# Patient Record
Sex: Female | Born: 1945 | Race: White | Hispanic: No | Marital: Married | Smoking: Former smoker
Health system: Southern US, Community
[De-identification: ages and names within clinical notes are randomized; demographics above are authoritative.]

## PROBLEM LIST (undated history)

## (undated) DIAGNOSIS — Z8489 Family history of other specified conditions: Secondary | ICD-10-CM

## (undated) DIAGNOSIS — I2510 Atherosclerotic heart disease of native coronary artery without angina pectoris: Secondary | ICD-10-CM

## (undated) DIAGNOSIS — C801 Malignant (primary) neoplasm, unspecified: Secondary | ICD-10-CM

## (undated) DIAGNOSIS — E039 Hypothyroidism, unspecified: Secondary | ICD-10-CM

## (undated) DIAGNOSIS — E7800 Pure hypercholesterolemia, unspecified: Secondary | ICD-10-CM

## (undated) DIAGNOSIS — E119 Type 2 diabetes mellitus without complications: Secondary | ICD-10-CM

## (undated) DIAGNOSIS — T753XXA Motion sickness, initial encounter: Secondary | ICD-10-CM

## (undated) DIAGNOSIS — G4730 Sleep apnea, unspecified: Secondary | ICD-10-CM

## (undated) HISTORY — PX: CORONARY ANGIOPLASTY: SHX604

## (undated) HISTORY — PX: EYE SURGERY: SHX253

## (undated) HISTORY — PX: APPENDECTOMY: SHX54

## (undated) HISTORY — PX: ABDOMINAL HYSTERECTOMY: SHX81

## (undated) HISTORY — PX: OTHER SURGICAL HISTORY: SHX169

---

## 2004-08-22 ENCOUNTER — Ambulatory Visit: Payer: Self-pay | Admitting: Internal Medicine

## 2005-09-09 ENCOUNTER — Ambulatory Visit: Payer: Self-pay | Admitting: Internal Medicine

## 2006-09-17 ENCOUNTER — Ambulatory Visit: Payer: Self-pay | Admitting: Internal Medicine

## 2007-10-19 ENCOUNTER — Ambulatory Visit: Payer: Self-pay | Admitting: Internal Medicine

## 2008-08-31 ENCOUNTER — Ambulatory Visit: Payer: Self-pay | Admitting: Internal Medicine

## 2008-12-13 ENCOUNTER — Ambulatory Visit: Payer: Self-pay | Admitting: Internal Medicine

## 2010-01-15 ENCOUNTER — Ambulatory Visit: Payer: Self-pay

## 2011-03-26 ENCOUNTER — Ambulatory Visit: Payer: Self-pay

## 2012-08-24 ENCOUNTER — Ambulatory Visit: Payer: Self-pay | Admitting: Obstetrics and Gynecology

## 2013-08-22 ENCOUNTER — Emergency Department: Payer: Self-pay | Admitting: Emergency Medicine

## 2013-08-22 LAB — CBC
HCT: 37.2 % (ref 35.0–47.0)
HGB: 12 g/dL (ref 12.0–16.0)
MCH: 30.3 pg (ref 26.0–34.0)
MCHC: 32.2 g/dL (ref 32.0–36.0)
MCV: 94 fL (ref 80–100)
Platelet: 293 10*3/uL (ref 150–440)
RBC: 3.96 10*6/uL (ref 3.80–5.20)
RDW: 13.9 % (ref 11.5–14.5)
WBC: 6.5 10*3/uL (ref 3.6–11.0)

## 2013-08-22 LAB — BASIC METABOLIC PANEL
Anion Gap: 5 — ABNORMAL LOW (ref 7–16)
BUN: 14 mg/dL (ref 7–18)
CHLORIDE: 105 mmol/L (ref 98–107)
CO2: 30 mmol/L (ref 21–32)
Calcium, Total: 9.5 mg/dL (ref 8.5–10.1)
Creatinine: 0.86 mg/dL (ref 0.60–1.30)
EGFR (Non-African Amer.): >60
Glucose: 144 mg/dL — ABNORMAL HIGH (ref 65–99)
Osmolality: 282 (ref 275–301)
Potassium: 4.4 mmol/L (ref 3.5–5.1)
SODIUM: 140 mmol/L (ref 136–145)

## 2013-08-22 LAB — TROPONIN I: Troponin-I: <0.02 ng/mL

## 2013-08-22 LAB — D-DIMER(ARMC): D-Dimer: 296 ng/ml

## 2013-09-07 ENCOUNTER — Ambulatory Visit: Payer: Self-pay | Admitting: Internal Medicine

## 2013-09-12 DIAGNOSIS — C801 Malignant (primary) neoplasm, unspecified: Secondary | ICD-10-CM

## 2013-09-12 HISTORY — DX: Malignant (primary) neoplasm, unspecified: C80.1

## 2014-01-04 ENCOUNTER — Ambulatory Visit: Payer: Self-pay | Admitting: Surgery

## 2014-08-05 NOTE — Op Note (Signed)
PATIENT NAME:  [name], TADLOCK MR#:  [id number] DATE OF BIRTH:  Mar 07, 1946  DATE OF PROCEDURE:  01/04/2014  PREOPERATIVE DIAGNOSIS: Screening colonoscopy.   POSTOPERATIVE DIAGNOSIS: Screening colonoscopy, single diverticulum of the descending colon.   SURGEON: Loreli Dollar, MD.   PROCEDURE: Colonoscopy.   INDICATIONS: This 69 year old female has never had screening colonoscopy and requested.   DESCRIPTION OF PROCEDURE: The patient was placed on the stretcher in the left lateral decubitus position. She was monitored with pulse oximetry, intermittent blood pressure recordings and cardiac monitor, given oxygen via nasal cannula and monitored by the anesthesia staff and sedated. The Olympus video colonoscope was inserted through the rectum and manipulated through a somewhat tortuous colon around to the cecum, which was identified by the ileocecal valve. Colon preparation was fairly good. Some bilious material was suctioned. The visibility was good. The colonic mucosa was examined as the scope was gradually withdrawn seeing no polyps or tumors. There was a single diverticulum encountered in the descending colon, which appeared to be small in size. The sigmoid colon and rectum appeared normal and the scope was removed.   Digital rectal exam did demonstrate weak sphincter tone and no palpable mass. The patient appeared to be in satisfactory condition and was moved to the recovery room for postoperative care.    ____________________________ J. Rochel Brome, MD jws:at D: 01/04/2014 09:07:13 ET T: 01/04/2014 12:12:24 ET JOB#: [id number]  cc: Loreli Dollar, MD, <Dictator> Loreli Dollar MD ELECTRONICALLY SIGNED 01/04/2014 13:23

## 2014-08-28 ENCOUNTER — Other Ambulatory Visit: Payer: Self-pay | Admitting: Internal Medicine

## 2014-08-28 DIAGNOSIS — Z1231 Encounter for screening mammogram for malignant neoplasm of breast: Secondary | ICD-10-CM

## 2014-09-12 ENCOUNTER — Ambulatory Visit
Admission: RE | Admit: 2014-09-12 | Discharge: 2014-09-12 | Disposition: A | Discharge status: Home / Self Care | Payer: Medicare Other | Source: Ambulatory Visit | Attending: Internal Medicine | Admitting: Internal Medicine

## 2014-09-12 ENCOUNTER — Other Ambulatory Visit: Payer: Self-pay | Admitting: Internal Medicine

## 2014-09-12 DIAGNOSIS — Z1231 Encounter for screening mammogram for malignant neoplasm of breast: Secondary | ICD-10-CM

## 2014-09-12 HISTORY — DX: Malignant (primary) neoplasm, unspecified: C80.1

## 2015-05-28 ENCOUNTER — Encounter: Payer: Medicare Other | Attending: Surgery | Admitting: Surgery

## 2015-05-28 DIAGNOSIS — E039 Hypothyroidism, unspecified: Secondary | ICD-10-CM | POA: Diagnosis not present

## 2015-05-28 DIAGNOSIS — L97222 Non-pressure chronic ulcer of left calf with fat layer exposed: Secondary | ICD-10-CM | POA: Diagnosis not present

## 2015-05-28 DIAGNOSIS — Z87891 Personal history of nicotine dependence: Secondary | ICD-10-CM | POA: Diagnosis not present

## 2015-05-28 DIAGNOSIS — E785 Hyperlipidemia, unspecified: Secondary | ICD-10-CM | POA: Diagnosis not present

## 2015-05-28 DIAGNOSIS — Y839 Surgical procedure, unspecified as the cause of abnormal reaction of the patient, or of later complication, without mention of misadventure at the time of the procedure: Secondary | ICD-10-CM | POA: Diagnosis not present

## 2015-05-28 DIAGNOSIS — E11622 Type 2 diabetes mellitus with other skin ulcer: Secondary | ICD-10-CM | POA: Diagnosis not present

## 2015-05-28 DIAGNOSIS — T8131XA Disruption of external operation (surgical) wound, not elsewhere classified, initial encounter: Secondary | ICD-10-CM | POA: Diagnosis not present

## 2015-05-28 DIAGNOSIS — C4492 Squamous cell carcinoma of skin, unspecified: Secondary | ICD-10-CM | POA: Insufficient documentation

## 2015-05-28 NOTE — Progress Notes (Addendum)
[name], [name] ([id number]) Visit Report for 05/28/2015 Chief Complaint Document Details Patient Name: [name], TACK. Date of Service: 05/28/2015 9:30 AM Medical Record Patient Account Number: [id number] Number: Afful, RN, BSN, Treating RN: 03-13-1946 (70 y.o. [name] Date of Birth/Sex: Female) Other Clinician: Primary Care Physician: Tracie Harrier Treating Ketina Mars Referring Physician: Tracie Harrier Physician/Extender: Suella Grove in Treatment: 0 Information Obtained from: Patient Chief Complaint Patient seen for complaints of Non-Healing Wound. the patient had a left lower extremity skin lesion excised in September 2016. She has had a nonhealing wound since Electronic Signature(s) Signed: 05/28/2015 10:46:51 AM By: Christin Fudge MD, FACS Entered By: Christin Fudge on 05/28/2015 10:46:51 [name] ([id number]) -------------------------------------------------------------------------------- Debridement Details Patient Name: [name] Date of Service: 05/28/2015 9:30 AM Medical Record Patient Account Number: [id number] Number: Afful, RN, BSN, Treating RN: [birth year] (70 y.o. [name] Date of Birth/Sex: Female) Other Clinician: Primary Care Physician: Tracie Harrier Treating Alistar Mcenery Referring Physician: Tracie Harrier Physician/Extender: Suella Grove in Treatment: 0 Debridement Performed for Wound #1 Left,Lateral Lower Leg Assessment: Performed By: Physician Christin Fudge, MD Debridement: Debridement Pre-procedure Yes Verification/Time Out Taken: Start Time: 10:40 Pain Control: Lidocaine 4% Topical Solution Level: Skin/Subcutaneous Tissue Total Area Debrided (L x 2 (cm) x 2.5 (cm) = 5 (cm) W): Tissue and other Viable, Non-Viable, Fibrin/Slough, Skin, Subcutaneous material debrided: Instrument: Other : gauze and saline Bleeding: Minimum End Time: 10:41 Procedural Pain: 1 Post Procedural Pain: 1 Response to Treatment:  Procedure was tolerated well Post Debridement Measurements of Total Wound Length: (cm) 2 Width: (cm) 2.5 Depth: (cm) 0.1 Volume: (cm) 0.393 Post Procedure Diagnosis Same as Pre-procedure Electronic Signature(s) Signed: 05/28/2015 10:46:23 AM By: Christin Fudge MD, FACS Signed: 05/29/2015 4:45:24 PM By: Regan Lemming BSN, RN Entered By: Christin Fudge on 05/28/2015 10:46:23 [name] ([id number]) -------------------------------------------------------------------------------- HPI Details Patient Name: [name] Date of Service: 05/28/2015 9:30 AM Medical Record Patient Account Number: [id number] Number: Afful, RN, BSN, Treating RN: [birth year] (70 y.o. [name] Date of Birth/Sex: Female) Other Clinician: Primary Care Physician: Tracie Harrier Treating Chloe Baig Referring Physician: Tracie Harrier Physician/Extender: Weeks in Treatment: 0 History of Present Illness Location: left lower extremity laterally Quality: Patient reports experiencing a dull pain to affected area(s). Severity: Patient states wound (s) are getting better. Duration: Patient has had the wound for > 3 months prior to seeking treatment at the wound center Timing: Pain in wound is Intermittent (comes and goes Context: The wound occurred when the patient had a squamous cell carcinoma excised and the sutures were removed and the wound opened out Modifying Factors: Other treatment(s) tried include:antibiotics for 2 separate courses Associated Signs and Symptoms: Patient reports having difficulty standing for long periods. HPI Description: 70 year old diabetic whose last hemoglobin A1c was 7.9 in August 2016, has had a skin cancer removed from her left lower extremity in September 2006. The wound got infected was given Keflex and had it for about a month. She continues to have an open ulcerated area on this leg and most recently was seen by her PCP and given doxycycline for 2 weeks. She  is a ex-smoker who gave up smoking in 2000. Past medical history significant for anemia, hyperlipidemia, hypothyroidism and as noted diabetes mellitus. She is also status post hysterectomy, appendectomy and carpal tunnel release. the patient has been using some abrasive methods of cleaning the wound with hydrogen peroxide and has been leaving the wound open as per instructions of the dermatologist. She may also have had a skin  substitute applied sometime in the past Electronic Signature(s) Signed: 05/28/2015 10:48:09 AM By: Christin Fudge MD, FACS Previous Signature: 05/28/2015 9:51:02 AM Version By: Christin Fudge MD, FACS Entered By: Christin Fudge on 05/28/2015 10:48:09 [name] ([id number]) -------------------------------------------------------------------------------- Physical Exam Details Patient Name: [name] Date of Service: 05/28/2015 9:30 AM Medical Record Patient Account Number: [id number] Number: Afful, RN, BSN, Treating RN: 12-14-1945 (70 y.o. [name] Date of Birth/Sex: Female) Other Clinician: Primary Care Physician: Tracie Harrier Treating Jazmon Kos Referring Physician: Tracie Harrier Physician/Extender: Weeks in Treatment: 0 Constitutional . Pulse regular. Respirations normal and unlabored. Afebrile. . Eyes Nonicteric. Reactive to light. Ears, Nose, Mouth, and Throat Lips, teeth, and gums WNL.Marland Kitchen Moist mucosa without lesions. Neck supple and nontender. No palpable supraclavicular or cervical adenopathy. Normal sized without goiter. Respiratory WNL. No retractions.. Cardiovascular Pedal Pulses WNL. ABI on the left and right lower extremity 0.99. No clubbing, cyanosis or edema. Gastrointestinal (GI) Abdomen without masses or tenderness.. No liver or spleen enlargement or tenderness.. Lymphatic No adneopathy. No adenopathy. No adenopathy. Musculoskeletal Adexa without tenderness or enlargement.. Digits and nails w/o clubbing,  cyanosis, infection, petechiae, ischemia, or inflammatory conditions.. Integumentary (Hair, Skin) No suspicious lesions. No crepitus or fluctuance. No peri-wound warmth or erythema. No masses.Marland Kitchen Psychiatric Judgement and insight Intact.. No evidence of depression, anxiety, or agitation.. Notes she has a circular wound on the left lower extremity lateral compartment in the mid lower leg. There is significant amount of debris over the wound and the edges are clean. This does not look like a recurrence of carcinoma. Sharp debridement was done with moist saline gauze and all the skin and surrounding debris were removed. Bleeding was controlled with pressure Electronic Signature(s) Signed: 05/28/2015 10:49:20 AM By: Christin Fudge MD, FACS Entered By: Christin Fudge on 05/28/2015 10:49:18 [name], [name] ([name] ([id number]) -------------------------------------------------------------------------------- Physician Orders Details Patient Name: [name] Date of Service: 05/28/2015 9:30 AM Medical Record Number: [id number] Patient Account Number: [id number] Date of Birth/Sex: 01/27/1946 (69 y.o. Female) Treating RN: Macarthur Critchley Primary Care Physician: Tracie Harrier Other Clinician: Referring Physician: Tracie Harrier Treating Physician/Extender: Frann Rider in Treatment: 0 Verbal / Phone Orders: Yes Clinician: Macarthur Critchley Read Back and Verified: Yes Diagnosis Coding Wound Cleansing Wound #1 Left,Lateral Lower Leg o Cleanse wound with mild soap and water o May Shower, gently pat wound dry prior to applying new dressing. Skin Barriers/Peri-Wound Care Wound #1 Left,Lateral Lower Leg o Skin Prep Primary Wound Dressing Wound #1 Left,Lateral Lower Leg o Prisma Ag Secondary Dressing Wound #1 Left,Lateral Lower Leg o Boardered Foam Dressing Dressing Change Frequency Wound #1 Left,Lateral Lower Leg o Change dressing every  other day. Follow-up Appointments Wound #1 Left,Lateral Lower Leg o Return Appointment in 1 week. Edema Control Wound #1 Left,Lateral Lower Leg o Other: - elevate leg as much as possible Notes obtain notes from [facility] [place] keep blood sugars under control Electronic Signature(s) ELSE, MCGAUGHEY ([id number]) Signed: 05/28/2015 4:26:55 PM By: Christin Fudge MD, FACS Signed: 05/28/2015 4:28:54 PM By: Rebecca Eaton RN, Sendra Entered By: Rebecca Eaton RN, Sendra on 05/28/2015 10:43:07 [name] ([id number]) -------------------------------------------------------------------------------- Problem List Details Patient Name: [name]. Date of Service: 05/28/2015 9:30 AM Medical Record Patient Account Number: [id number] Number: Afful, RN, BSN, Treating RN: 1945/07/08 (70 y.o. 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 [name] Date of Birth/Sex: Female) Other Clinician: Primary Care Physician: Tracie Harrier Treating Muzamil Harker Referring Physician: Tracie Harrier Physician/Extender: Suella Grove in Treatment: 0 Subjective Chief Complaint Information obtained from Patient Patient seen for complaints of Non-Healing Wound. the patient had a left lower extremity skin lesion excised in September 2016. She has had a nonhealing wound since History of Present Illness (HPI) The following HPI elements were documented for the patient's wound: Location: left lower extremity laterally Quality: Patient reports experiencing a dull pain to affected area(s). Severity: Patient states wound (s) are getting better. Duration: Patient has had the wound for > 3 months prior to seeking treatment at the wound center Timing: Pain in wound is Intermittent (comes and goes Context: The wound occurred when the patient had a squamous cell carcinoma excised and the sutures were removed and the wound opened out Modifying Factors: Other treatment(s) tried include:antibiotics for 2 separate courses Associated Signs and Symptoms: Patient reports having difficulty standing for long periods. 70 year old diabetic whose last hemoglobin A1c was 7.9 in August 2016, has had a skin cancer removed from her left lower extremity in September 2006. The wound got infected was given Keflex and had it for about a month. She continues to have an open ulcerated area on this leg and most recently was seen by her PCP and given doxycycline for 2 weeks. She is a ex-smoker who gave up smoking in 2000. Past medical history significant for anemia, hyperlipidemia, hypothyroidism and as noted diabetes mellitus. She is also status post hysterectomy, appendectomy and carpal tunnel release. the patient has been using some abrasive methods of cleaning the wound with hydrogen peroxide and has been leaving the wound open as  per instructions of the dermatologist. She may also have had a skin substitute applied sometime in the past Wound History Patient presents with 1 open wound that has been present for approximately 6 months. Patient has been treating wound in the following manner: skintec and dressing. Laboratory tests have been performed in the last month. Patient reportedly has not tested positive for an antibiotic resistant organism. Patient reportedly has not tested positive for osteomyelitis. Patient reportedly has not had testing performed to evaluate circulation in the legs. Patient experiences the following problems associated with their wounds: infection. [name], [name] ([id number]) Patient History Information obtained from Patient. Allergies Darvocet-N (Reaction: nausea), Percocet (Reaction: nausea), tramadol (Reaction: nausea) Family History Cancer - Mother, Diabetes - Mother, Heart Disease - Mother, No family history of Hypertension, Kidney Disease, Lung Disease, Seizures, Stroke, Thyroid Problems, Tuberculosis. Social History Former smoker - 17 years quit, Marital Status - Married, Alcohol Use - Never, Drug Use - No History, Caffeine Use - Daily. Medical History Eyes Patient has history of Cataracts - beginning stage Ear/Nose/Mouth/Throat Denies history of Chronic sinus problems/congestion, Middle ear problems Hematologic/Lymphatic Denies history of Anemia, Hemophilia, Human Immunodeficiency Virus,  Lymphedema, Sickle Cell Disease Respiratory Denies history of Aspiration, Asthma, Chronic Obstructive Pulmonary Disease (COPD), Pneumothorax, Sleep Apnea, Tuberculosis Cardiovascular Denies history of Angina, Arrhythmia, Congestive Heart Failure, Coronary Artery Disease, Deep Vein Thrombosis, Hypertension, Hypotension, Myocardial Infarction, Peripheral Arterial Disease, Peripheral Venous Disease, Phlebitis, Vasculitis Gastrointestinal Denies history of Cirrhosis , Colitis, Crohn s,  Hepatitis A, Hepatitis B, Hepatitis C Endocrine Patient has history of Type II Diabetes Genitourinary Denies history of End Stage Renal Disease Immunological Denies history of Lupus Erythematosus, Raynaud s, Scleroderma Integumentary (Skin) Denies history of History of Burn, History of pressure wounds Musculoskeletal Denies history of Gout, Rheumatoid Arthritis, Osteoarthritis, Osteomyelitis Neurologic Denies history of Dementia, Neuropathy, Quadriplegia, Paraplegia, Seizure Disorder Oncologic Denies history of Received Chemotherapy, Received Radiation Psychiatric Denies history of Anorexia/bulimia, Confinement Anxiety Patient is treated with Oral Agents. Blood sugar is tested. Blood sugar results noted at the following times: Breakfast - 163. [name], [name] ([id number]) Hospitalization/Surgery History - hysterectomy. - appendectomy. - endoscopic carpal tunnel release. Medical And Surgical History Notes Constitutional Symptoms (General Health) cholestrol, hypothyroidism, hyperlipidemia Review of Systems (ROS) Constitutional Symptoms (General Health) The patient has no complaints or symptoms. Eyes The patient has no complaints or symptoms. Ear/Nose/Mouth/Throat The patient has no complaints or symptoms. Hematologic/Lymphatic The patient has no complaints or symptoms. Respiratory The patient has no complaints or symptoms. Cardiovascular The patient has no complaints or symptoms. Gastrointestinal The patient has no complaints or symptoms. Endocrine The patient has no complaints or symptoms. Genitourinary The patient has no complaints or symptoms. Immunological The patient has no complaints or symptoms. Integumentary (Skin) Complains or has symptoms of Wounds - 1 left leg. Musculoskeletal The patient has no complaints or symptoms. Neurologic The patient has no complaints or symptoms. Oncologic Hx skin Cancer Psychiatric Complains or has symptoms of  Claustrophobia. Medications Bactrim DS 800 mg-160 mg tablet oral 1 1 tablet oral Tylenol PM Extra Strength 25 mg-500 mg tablet oral 2 2 tablet oral aspirin 81 mg tablet,delayed release oral 1 1 tablet,delayed release (DR/EC) oral Tylenol Extra Strength 500 mg tablet oral tablet oral glipizide 5 mg tablet oral 1 1 tablet oral lovastatin 20 mg tablet oral 1 1 tablet oral Ocuvite 150 mg-30 unit-5 mg-150 mg capsule oral capsule oral [name], TATRO. ([id number]) alendronate 70 mg tablet oral 1 1 tablet oral Calcium 600 600 mg (1,500 mg) tablet oral 1 1 tablet oral for daily in the morning Estroven 155 mg capsule oral 1 1 capsule oral Hair,Skin,Nails with Biotin 7.5 mg-7.5 unit-1,250 mcg chewable tablet oral 2 2 tablet,chewable oral Geritol Complete 16 mg iron-0.38 mg tablet oral 1 1 tablet oral Cinnamon 500 mg capsule oral 2 2 capsule oral iron 325 mg (65 mg iron) tablet oral 1 1 tablet oral Fish Oil 1,000 mg (120 mg-180 mg) capsule oral 1 1 capsule oral for daily in the morning magnesium 250 mg tablet oral 1 1 tablet oral Centrum 3,500 unit-18 mg-0.4 mg chewable tablet oral tablet,chewable oral doxycycline hyclate 100 mg tablet oral 1 1 tablet oral levothyroxine 125 mcg tablet oral 1 1 tablet oral levothyroxine 25 mcg tablet oral 1 1 tablet oral Vitamin B-12 1,000 mcg tablet oral 1 1 tablet oral vitamin E 600 unit capsule oral 1 1 capsule oral Objective Constitutional Pulse regular. Respirations normal and unlabored. Afebrile. Vitals Time Taken: 10:10 AM, Height: 63 in, Source: Stated, Weight: 155 lbs, Source: Stated, BMI: 27.5, Temperature: 97.8 F, Pulse: 76 bpm, Blood Pressure: 152/67 mmHg. Eyes Nonicteric. Reactive to light. Ears, Nose, Mouth, and Throat Lips,  teeth, and gums WNL.Marland Kitchen Moist mucosa without lesions. Neck supple and nontender. No palpable supraclavicular or cervical adenopathy. Normal sized without goiter. Respiratory WNL. No retractions.. Cardiovascular Pedal  Pulses WNL. ABI on the left and right lower extremity 0.99. No clubbing, cyanosis or edema. Gastrointestinal (GI) Abdomen without masses or tenderness.. No liver or spleen enlargement or tenderness.. Lymphatic No adneopathy. No adenopathy. No adenopathy. [name], [name] ([id number]) Musculoskeletal Adexa without tenderness or enlargement.. Digits and nails w/o clubbing, cyanosis, infection, petechiae, ischemia, or inflammatory conditions.Marland Kitchen Psychiatric Judgement and insight Intact.. No evidence of depression, anxiety, or agitation.. General Notes: she has a circular wound on the left lower extremity lateral compartment in the mid lower leg. There is significant amount of debris over the wound and the edges are clean. This does not look like a recurrence of carcinoma. Sharp debridement was done with moist saline gauze and all the skin and surrounding debris were removed. Bleeding was controlled with pressure Integumentary (Hair, Skin) No suspicious lesions. No crepitus or fluctuance. No peri-wound warmth or erythema. No masses.. Wound #1 status is Open. Original cause of wound was Other Lesion. The wound is located on the Left,Lateral Lower Leg. The wound measures 2cm length x 2.5cm width x 0.1cm depth; 3.927cm^2 area and 0.393cm^3 volume. The wound is limited to skin breakdown. There is no tunneling or undermining noted. There is a large amount of serous drainage noted. The wound margin is flat and intact. There is small (1-33%) red granulation within the wound bed. There is a medium (34-66%) amount of necrotic tissue within the wound bed including Adherent Slough. The periwound skin appearance exhibited: Dry/Scaly, Erythema. The periwound skin appearance did not exhibit: Callus, Crepitus, Excoriation, Fluctuance, Friable, Induration, Localized Edema, Rash, Scarring, Maceration, Moist, Atrophie Blanche, Cyanosis, Ecchymosis, Hemosiderin Staining, Mottled, Pallor, Rubor. The surrounding  wound skin color is noted with erythema which is circumferential. Periwound temperature was noted as No Abnormality. The periwound has tenderness on palpation. Assessment Active Problems ICD-10 E11.622 - Type 2 diabetes mellitus with other skin ulcer T81.31XA - Disruption of external operation (surgical) wound, not elsewhere classified, initial encounter L97.222 - Non-pressure chronic ulcer of left calf with fat layer exposed C44.729 - Squamous cell carcinoma of skin of left lower limb, including hip This 70 year old diabetic has had a nonhealing wound to the left lower extremity from a previous skin cancer removal. Prostate to stop all previous instructions and have recommended: [name], [name]. ([id number]) 1. Dressing on every other day with Prisma AG and a bordered foam dressing 2. She is allowed to wash with soap and water and do the dressing sooner she is out of the shower. 3. good control of her diabetes mellitus 4. Elevation of the limb as often as possible 5. He is back in the wound center on weekly intervals Procedures Wound #1 Wound #1 is an Atypical located on the Left,Lateral Lower Leg . There was a Skin/Subcutaneous Tissue Debridement [id number]) debridement with total area of 5 sq cm performed by Christin Fudge, MD. with the following instrument(s): gauze and saline to remove Viable and Non-Viable tissue/material including Fibrin/Slough, Skin, and Subcutaneous after achieving pain control using Lidocaine 4% Topical Solution. A time out was conducted prior to the start of the procedure. A Minimum amount of bleeding was controlled with N/A. The procedure was tolerated well with a pain level of 1 throughout and a pain level of 1 following the procedure. Post Debridement Measurements: 2cm length x 2.5cm width x 0.1cm depth; 0.393cm^3 volume. Post procedure  Diagnosis Wound #1: Same as Pre-Procedure Plan Wound Cleansing: Wound #1 Left,Lateral Lower Leg: Cleanse wound with  mild soap and water May Shower, gently pat wound dry prior to applying new dressing. Skin Barriers/Peri-Wound Care: Wound #1 Left,Lateral Lower Leg: Skin Prep Primary Wound Dressing: Wound #1 Left,Lateral Lower Leg: Prisma Ag Secondary Dressing: Wound #1 Left,Lateral Lower Leg: Boardered Foam Dressing Dressing Change Frequency: Wound #1 Left,Lateral Lower Leg: Change dressing every other day. Follow-up Appointments: Wound #1 Left,Lateral Lower Leg: Return Appointment in 1 week. Edema Control: Wound #1 Left,Lateral Lower Leg: Other: - elevate leg as much as possible General Notes: obtain notes from [facility] [place] keep blood sugars under [name], [name] ([id number]) control This 70 year old diabetic has had a nonhealing wound to the left lower extremity from a previous skin cancer removal. Prostate to stop all previous instructions and have recommended: 1. Dressing on every other day with Prisma AG and a bordered foam dressing 2. She is allowed to wash with soap and water and do the dressing sooner she is out of the shower. 3. good control of her diabetes mellitus 4. Elevation of the limb as often as possible 5. He is back in the wound center on weekly intervals Electronic Signature(s) Signed: 05/30/2015 5:07:20 PM By: Christin Fudge MD, FACS Previous Signature: 05/28/2015 4:29:41 PM Version By: Christin Fudge MD, FACS Previous Signature: 05/28/2015 10:50:45 AM Version By: Christin Fudge MD, FACS Entered By: Christin Fudge on 05/30/2015 17:07:20 [name] ([id number]) -------------------------------------------------------------------------------- ROS/PFSH Details Patient Name: [name] Date of Service: 05/28/2015 9:30 AM Medical Record Number: [id number] Patient Account Number: [id number] Date of Birth/Sex: 11/04/1945 (69 y.o. Female) Treating RN: Macarthur Critchley Primary Care Physician: Tracie Harrier Other Clinician: Referring  Physician: Tracie Harrier Treating Physician/Extender: Frann Rider in Treatment: 0 Information Obtained From Patient Wound History Do you currently have one or more open woundso Yes How many open wounds do you currently haveo 1 Approximately how long have you had your woundso 6 months How have you been treating your wound(s) until nowo skintec and dressing Has your wound(s) ever healed and then re-openedo No Have you had any lab work done in the past montho Yes Who ordered the lab work doneo PCP Have you tested positive for an antibiotic resistant organism (MRSA, VRE)o No Have you tested positive for osteomyelitis (bone infection)o No Have you had any tests for circulation on your legso No Have you had other problems associated with your woundso Infection Integumentary (Skin) Complaints and Symptoms: Positive for: Wounds - 1 left leg Medical History: Negative for: History of Burn; History of pressure wounds Psychiatric Complaints and Symptoms: Positive for: Claustrophobia Medical History: Negative for: Anorexia/bulimia; Confinement Anxiety Constitutional Symptoms (General Health) Complaints and Symptoms: No Complaints or Symptoms Medical History: Past Medical History Notes: cholestrol, hypothyroidism, hyperlipidemia Eyes Schloss, DELANE WALPOLE ([id number]) Complaints and Symptoms: No Complaints or Symptoms Medical History: Positive for: Cataracts - beginning stage Ear/Nose/Mouth/Throat Complaints and Symptoms: No Complaints or Symptoms Medical History: Negative for: Chronic sinus problems/congestion; Middle ear problems Hematologic/Lymphatic Complaints and Symptoms: No Complaints or Symptoms Medical History: Negative for: Anemia; Hemophilia; Human Immunodeficiency Virus; Lymphedema; Sickle Cell Disease Respiratory Complaints and Symptoms: No Complaints or Symptoms Medical History: Negative for: Aspiration; Asthma; Chronic Obstructive Pulmonary Disease  (COPD); Pneumothorax; Sleep Apnea; Tuberculosis Cardiovascular Complaints and Symptoms: No Complaints or Symptoms Medical History: Negative for: Angina; Arrhythmia; Congestive Heart Failure; Coronary Artery Disease; Deep Vein Thrombosis; Hypertension; Hypotension; Myocardial Infarction; Peripheral Arterial Disease; Peripheral Venous Disease; Phlebitis;  Vasculitis Gastrointestinal Complaints and Symptoms: No Complaints or Symptoms Medical History: Negative for: Cirrhosis ; Colitis; Crohnos; Hepatitis A; Hepatitis B; Hepatitis C Endocrine [name], [name] ([id number]) Complaints and Symptoms: No Complaints or Symptoms Medical History: Positive for: Type II Diabetes Time with diabetes: 6-8 years Treated with: Oral agents Blood sugar tested every day: Yes Tested : daily Blood sugar testing results: Breakfast: 163 Genitourinary Complaints and Symptoms: No Complaints or Symptoms Medical History: Negative for: End Stage Renal Disease Immunological Complaints and Symptoms: No Complaints or Symptoms Medical History: Negative for: Lupus Erythematosus; Raynaudos; Scleroderma Musculoskeletal Complaints and Symptoms: No Complaints or Symptoms Medical History: Negative for: Gout; Rheumatoid Arthritis; Osteoarthritis; Osteomyelitis Neurologic Complaints and Symptoms: No Complaints or Symptoms Medical History: Negative for: Dementia; Neuropathy; Quadriplegia; Paraplegia; Seizure Disorder Oncologic Complaints and Symptoms: Review of System Notes: Hx skin Cancer Medical HistoryCAROLLYN, [name] ([id number]) Negative for: Received Chemotherapy; Received Radiation HBO Extended History Items Eyes: Cataracts Immunizations Immunization Notes: up to date, flu, shingles, pneumonia, tetnus Hospitalization / Surgery History Name of Hospital Purpose of Hospitalization/Surgery Date hysterectomy appendectomy endoscopic carpal tunnel release Family and Social History Cancer: Yes  - Mother; Diabetes: Yes - Mother; Heart Disease: Yes - Mother; Hypertension: No; Kidney Disease: No; Lung Disease: No; Seizures: No; Stroke: No; Thyroid Problems: No; Tuberculosis: No; Former smoker - 17 years quit; Marital Status - Married; Alcohol Use: Never; Drug Use: No History; Caffeine Use: Daily; Financial Concerns: No; Food, Clothing or Shelter Needs: No; Support System Lacking: No; Transportation Concerns: No Physician Affirmation I have reviewed and agree with the above information. Electronic Signature(s) Signed: 05/28/2015 4:26:55 PM By: Christin Fudge MD, FACS Signed: 05/28/2015 4:28:54 PM By: Ardean Larsen Previous Signature: 05/28/2015 10:13:28 AM Version By: Christin Fudge MD, FACS Entered By: Rebecca Eaton RN, Sendra on 05/28/2015 10:31:44 [name] ([id number]) -------------------------------------------------------------------------------- SuperBill Details Patient Name: [name] Date of Service: 05/28/2015 Medical Record Patient Account Number: [id number] Number: Afful, RN, BSN, Treating RN: [birth year] (70 y.o. 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, including hip Facility Procedures CPT4: Description Modifier Quantity Code [id number] - WOUND CARE VISIT-LEV 3 EST PT 1 CPT4: [id number] - DEB SUBQ TISSUE 20 SQ CM/< 1 ICD-10 Description Diagnosis E11.622 Type 2 diabetes mellitus with other skin ulcer T81.31XA Disruption of external operation  (surgical) wound, not elsewhere classified, initial encounter L97.222  Non-pressure chronic ulcer of left calf with fat layer exposed Physician Procedures CPT4: Description Modifier Quantity Code [id number] - WC PHYS LEVEL 4 - NEW PT 25 1 ICD-10 Description Diagnosis E11.622 Type 2 diabetes mellitus with other skin ulcer T81.31XA Disruption of external operation (surgical) wound, not elsewhere  classified, initial encounter 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

## 2015-05-29 NOTE — Progress Notes (Addendum)
[name], SOCKS ([id number]) Visit Report for 05/28/2015 Allergy List Details Patient Name: [name], [name]. Date of Service: 05/28/2015 9:30 AM Medical Record Number: [id number] Patient Account Number: [id number] Date of Birth/Sex: 04/17/1945 (70 y.o. Female) Treating RN: Macarthur Critchley Primary Care Physician: Tracie Harrier Other Clinician: Referring Physician: Tracie Harrier Treating Physician/Extender: Frann Rider in Treatment: 0 Allergies Active Allergies Darvocet-N Reaction: nausea Percocet Reaction: nausea tramadol Reaction: nausea Allergy Notes Electronic Signature(s) Signed: 05/28/2015 4:28:54 PM By: Rebecca Eaton RN, Sendra Entered By: Rebecca Eaton RN, Sendra on 05/28/2015 10:29:51 [name] ([id number]) -------------------------------------------------------------------------------- Arrival Information Details Patient Name: [name] Date of Service: 05/28/2015 9:30 AM Medical Record Number: [id number] Patient Account Number: [id number] Date of Birth/Sex: [birth year] (70 y.o. Female) Treating RN: Macarthur Critchley Primary Care Physician: Tracie Harrier Other Clinician: Referring Physician: Tracie Harrier Treating Physician/Extender: Frann Rider in Treatment: 0 Visit Information Patient Arrived: Ambulatory Arrival Time: 09:46 Accompanied By: husband Transfer Assistance: None Patient Identification Verified: Yes Secondary Verification Process Yes Completed: Patient Has Alerts: Yes Patient Alerts: Patient on Blood Thinner 05/28/15 ABI .99 Right 05/28/15 ABI .99 Left Electronic Signature(s) Signed: 05/28/2015 4:28:54 PM By: Rebecca Eaton RN, Sendra Entered By: Rebecca Eaton RN, Sendra on 05/28/2015 10:21:07 [name] ([id number]) -------------------------------------------------------------------------------- Clinic Level of Care Assessment Details Patient Name: [name] Date of Service: 05/28/2015 9:30  AM Medical Record Number: [id number] Patient Account Number: [id number] Date of Birth/Sex: 12-Nov-1945 (70 y.o. Female) Treating RN: Macarthur Critchley Primary Care Physician: Tracie Harrier Other Clinician: Referring Physician: Tracie Harrier Treating Physician/Extender: Frann Rider in Treatment: 0 Clinic Level of Care Assessment Items TOOL 1 Quantity Score X - Use when EandM and Procedure is performed on INITIAL visit 1 0 ASSESSMENTS - Nursing Assessment / Reassessment X - General Physical Exam (combine w/ comprehensive assessment (listed just 1 20 below) when performed on new pt. evals) X - Comprehensive Assessment (HX, ROS, Risk Assessments, Wounds Hx, etc.) 1 25 ASSESSMENTS - Wound and Skin Assessment / Reassessment []  - Dermatologic / Skin Assessment (not related to wound area) 0 ASSESSMENTS - Ostomy and/or Continence Assessment and Care []  - Incontinence Assessment and Management 0 []  - Ostomy Care Assessment and Management (repouching, etc.) 0 PROCESS - Coordination of Care X - Simple Patient / Family Education for ongoing care 1 15 []  - Complex (extensive) Patient / Family Education for ongoing care 0 X - Staff obtains Programmer, systems, Records, Test Results / Process Orders 1 10 X - Staff telephones HHA, Nursing Homes / Clarify orders / etc 1 10 []  - Routine Transfer to another Facility (non-emergent condition) 0 []  - Routine Hospital Admission (non-emergent condition) 0 []  - New Admissions / Biomedical engineer / Ordering NPWT, Apligraf, etc. 0 []  - Emergency Hospital Admission (emergent condition) 0 PROCESS - Special Needs []  - Pediatric / Minor Patient Management 0 []  - Isolation Patient Management 0 KESSLER, AGATE ([id number]) []  - Hearing / Language / Visual special needs 0 []  - Assessment of Community assistance (transportation, D/C planning, etc.) 0 []  - Additional assistance / Altered mentation 0 []  - Support Surface(s) Assessment (bed, cushion, seat,  etc.) 0 INTERVENTIONS - Miscellaneous []  - External ear exam 0 []  - Patient Transfer (multiple staff / Civil Service fast streamer / Similar devices) 0 []  - Simple Staple / Suture removal (25 or less) 0 []  - Complex Staple / Suture removal (26 or more) 0 []  - Hypo/Hyperglycemic Management (do not check if billed separately) 0 X - Ankle / Brachial Index (ABI) - do not check  if billed separately 1 15 Has the patient been seen at the hospital within the last three years: Yes Total Score: 95 Level Of Care: New/Established - Level 3 Electronic Signature(s) Signed: 05/28/2015 4:28:54 PM By: Rebecca Eaton RN, Sendra Entered By: Rebecca Eaton RN, Sendra on 05/28/2015 13:32:29 [name] ([id number]) -------------------------------------------------------------------------------- Encounter Discharge Information Details Patient Name: [name] Date of Service: 05/28/2015 9:30 AM Medical Record Number: [id number] Patient Account Number: [id number] Date of Birth/Sex: Aug 19, 1945 (70 y.o. Female) Treating RN: Afful, RN, BSN, [name] Primary Care Physician: Tracie Harrier Other Clinician: Referring Physician: Tracie Harrier Treating Physician/Extender: Frann Rider in Treatment: 0 Encounter Discharge Information Items Discharge Pain Level: 0 Discharge Condition: Stable Ambulatory Status: Ambulatory Discharge Destination: Home Transportation: Private Auto Accompanied By: sposue Schedule Follow-up Appointment: Yes Medication Reconciliation completed and provided to Patient/Care No Linley Moxley: Provided on Clinical Summary of Care: 05/28/2015 Form Type Recipient Paper Patient ER Electronic Signature(s) Signed: 05/28/2015 4:28:54 PM By: Rebecca Eaton RN, Sendra Previous Signature: 05/28/2015 10:49:51 AM Version By: Ruthine Dose Entered By: Rebecca Eaton RN, Sendra on 05/28/2015 10:50:14 [name] ([id number]) -------------------------------------------------------------------------------- Lower  Extremity Assessment Details Patient Name: [name] Date of Service: 05/28/2015 9:30 AM Medical Record Number: [id number] Patient Account Number: [id number] Date of Birth/Sex: 05-23-1945 (70 y.o. Female) Treating RN: Macarthur Critchley Primary Care Physician: Tracie Harrier Other Clinician: Referring Physician: Tracie Harrier Treating Physician/Extender: Frann Rider in Treatment: 0 Edema Assessment Assessed: [Left: No] [Right: No] Edema: [Left: No] [Right: No] Calf Left: Right: Point of Measurement: 28 cm From Medial Instep 34 cm 33.5 cm Ankle Left: Right: Point of Measurement: 10 cm From Medial Instep 21 cm 21 cm Vascular Assessment Pulses: Posterior Tibial Palpable: [Left:Yes] Dorsalis Pedis Palpable: [Left:Yes] Extremity colors, hair growth, and conditions: Extremity Color: [Left:Normal] Hair Growth on Extremity: [Left:No] Temperature of Extremity: [Left:Warm] Capillary Refill: [Left:< 3 seconds] Dependent Rubor: [Left:No] Blanched when Elevated: [Left:No] Lipodermatosclerosis: [Left:No] Blood Pressure: Brachial: [Left:152] [Right:150] Dorsalis Pedis: 150 [Left:Dorsalis Pedis: 150] Ankle: Posterior Tibial: [Left:Posterior Tibial: 0.99] [Right:0.99] Toe Nail Assessment Left: Right: Thick: No Discolored: No Deformed: No Harlin, Erin Hearing ([id number]) Improper Length and Hygiene: No Electronic Signature(s) Signed: 05/28/2015 4:28:54 PM By: Rebecca Eaton RN, Sendra Entered By: Rebecca Eaton RN, Sendra on 05/28/2015 10:20:11 [name] ([id number]) -------------------------------------------------------------------------------- Multi Wound Chart Details Patient Name: [name] Date of Service: 05/28/2015 9:30 AM Medical Record Number: [id number] Patient Account Number: [id number] Date of Birth/Sex: April 11, 1946 (70 y.o. Female) Treating RN: Macarthur Critchley Primary Care Physician: Tracie Harrier Other Clinician: Referring Physician:  Tracie Harrier Treating Physician/Extender: Frann Rider in Treatment: 0 Vital Signs Height(in): 63 Pulse(bpm): 76 Weight(lbs): 155 Blood Pressure 152/67 (mmHg): Body Mass Index(BMI): 27 Temperature(F): 97.8 Respiratory Rate (breaths/min): Photos: [1:No Photos] [N/A:N/A] Wound Location: [1:Left Lower Leg - Lateral] [N/A:N/A] Wounding Event: [1:Other Lesion] [N/A:N/A] Primary Etiology: [1:Atypical] [N/A:N/A] Date Acquired: [1:12/21/2014] [N/A:N/A] Weeks of Treatment: [1:0] [N/A:N/A] Wound Status: [1:Open] [N/A:N/A] Measurements L x W x D 2x2.5x0.1 [N/A:N/A] (cm) Area (cm) : [1:3.927] [N/A:N/A] Volume (cm) : [1:0.393] [N/A:N/A] Classification: [1:Full Thickness Without Exposed Support Structures] [N/A:N/A] Exudate Amount: [1:Small] [N/A:N/A] Exudate Type: [1:Serous] [N/A:N/A] Exudate Color: [1:amber] [N/A:N/A] Wound Margin: [1:Flat and Intact] [N/A:N/A] Granulation Amount: [1:Small (1-33%)] [N/A:N/A] Granulation Quality: [1:Red] [N/A:N/A] Necrotic Amount: [1:Medium (34-66%)] [N/A:N/A] Exposed Structures: [1:Fascia: No Fat: No Tendon: No Muscle: No Joint: No Bone: No Limited to Skin Breakdown] [N/A:N/A] Epithelialization: [1:None] [N/A:N/A] Periwound Skin Texture: Edema: No N/A N/A Excoriation: No Induration: No Callus: No Crepitus: No Fluctuance: No Friable: No Rash: No Scarring:  No Periwound Skin Dry/Scaly: Yes N/A N/A Moisture: Maceration: No Moist: No Periwound Skin Color: Erythema: Yes N/A N/A Atrophie Blanche: No Cyanosis: No Ecchymosis: No Hemosiderin Staining: No Mottled: No Pallor: No Rubor: No Erythema Location: Circumferential N/A N/A Temperature: No Abnormality N/A N/A Tenderness on Yes N/A N/A Palpation: Wound Preparation: Ulcer Cleansing: N/A N/A Rinsed/Irrigated with Saline Topical Anesthetic Applied: Other: lidocaine 4% Treatment Notes Electronic Signature(s) Signed: 05/28/2015 4:28:54 PM By: Rebecca Eaton RN, Sendra Entered  By: Rebecca Eaton RN, Sendra on 05/28/2015 10:23:31 [name] ([id number]) -------------------------------------------------------------------------------- Pain Assessment Details Patient Name: [name] Date of Service: 05/28/2015 9:30 AM Medical Record Number: [id number] Patient Account Number: [id number] Date of Birth/Sex: March 19, 1946 (70 y.o. Female) Treating RN: Macarthur Critchley Primary Care Physician: Tracie Harrier Other Clinician: Referring Physician: Tracie Harrier Treating Physician/Extender: Frann Rider in Treatment: 0 Active Problems Location of Pain Severity and Description of Pain Patient Has Paino Yes Site Locations Pain Location: Pain in Ulcers With Dressing Change: No Duration of the Pain. Constant / Intermittento Intermittent Rate the pain. Current Pain Level: 2 Character of Pain Describe the Pain: Aching, Throbbing, Other: hurts Pain Management and Medication Current Pain Management: Medication: Yes Electronic Signature(s) Signed: 05/28/2015 4:28:54 PM By: Rebecca Eaton RN, Sendra Entered By: Rebecca Eaton RN, Sendra on 05/28/2015 09:47:52 Domangue, Erin Hearing ([id number]) -------------------------------------------------------------------------------- Patient/Caregiver Education Details Patient Name: [name] Date of Service: 05/28/2015 9:30 AM Medical Record Number: [id number] Patient Account Number: [id number] Date of Birth/Gender: 12/14/1945 (70 y.o. Female) Treating RN: Macarthur Critchley Primary Care Physician: Tracie Harrier Other Clinician: Referring Physician: Tracie Harrier Treating Physician/Extender: Frann Rider in Treatment: 0 Education Assessment Education Provided To: Patient Education Topics Provided Wound Debridement: Handouts: Wound Debridement Methods: Explain/Verbal Responses: State content correctly Wound/Skin Impairment: Handouts: Caring for Your Ulcer, Skin Care Do's and Dont's Methods:  Explain/Verbal Responses: State content correctly Electronic Signature(s) Signed: 05/28/2015 4:28:54 PM By: Rebecca Eaton RN, Sendra Entered By: Rebecca Eaton RN, Sendra on 05/28/2015 10:23:53 [name] ([id number]) -------------------------------------------------------------------------------- Wound Assessment Details Patient Name: [name] Date of Service: 05/28/2015 9:30 AM Medical Record Number: [id number] Patient Account Number: [id number] Date of Birth/Sex: Apr 03, 1946 (70 y.o. Female) Treating RN: Macarthur Critchley Primary Care Physician: Tracie Harrier Other Clinician: Referring Physician: Tracie Harrier Treating Physician/Extender: Frann Rider in Treatment: 0 Wound Status Wound Number: 1 Primary Etiology: Atypical Wound Location: Left Lower Leg - Lateral Wound Status: Open Wounding Event: Other Lesion Comorbid History: Cataracts, Type II Diabetes Date Acquired: 12/21/2014 Weeks Of Treatment: 0 Clustered Wound: No Photos Wound Measurements Length: (cm) 2 Width: (cm) 2.5 Depth: (cm) 0.1 Area: (cm) 3.927 Volume: (cm) 0.393 % Reduction in Area: 0% % Reduction in Volume: 0% Epithelialization: None Tunneling: No Undermining: No Wound Description Full Thickness Without Classification: Exposed Support Structures Diabetic Severity Grade 1 (Wagner): Wound Margin: Flat and Intact Exudate Amount: Large Exudate Type: Serous Exudate Color: amber Foul Odor After Cleansing: No Wound Bed Granulation Amount: Small (1-33%) Exposed Structure Granulation Quality: Red Fascia Exposed: No DOLLINDA, DICELLO. ([id number]) Necrotic Amount: Medium (34-66%) Fat Layer Exposed: No Necrotic Quality: Adherent Slough Tendon Exposed: No Muscle Exposed: No Joint Exposed: No Bone Exposed: No Limited to Skin Breakdown Periwound Skin Texture Texture Color No Abnormalities Noted: No No Abnormalities Noted: No Callus: No Atrophie Blanche: No Crepitus:  No Cyanosis: No Excoriation: No Ecchymosis: No Fluctuance: No Erythema: Yes Friable: No Erythema Location: Circumferential Induration: No Hemosiderin Staining: No Localized Edema: No Mottled: No Rash: No Pallor: No Scarring: No Rubor: No Moisture Temperature /  Pain No Abnormalities Noted: No Temperature: No Abnormality Dry / Scaly: Yes Tenderness on Palpation: Yes Maceration: No Moist: No Wound Preparation Ulcer Cleansing: Rinsed/Irrigated with Saline Topical Anesthetic Applied: Other: lidocaine 4%, Electronic Signature(s) Signed: 05/28/2015 5:59:16 PM By: Alric Quan Signed: 06/04/2015 4:54:11 PM By: Rebecca Eaton RN, Sendra Previous Signature: 05/28/2015 4:28:54 PM Version By: Rebecca Eaton RN, Sendra Entered By: Alric Quan on 05/28/2015 17:41:19 Meder, Erin Hearing ([id number]) -------------------------------------------------------------------------------- Vitals Details Patient Name: [name] Date of Service: 05/28/2015 9:30 AM Medical Record Number: [id number] Patient Account Number: [id number] Date of Birth/Sex: [birth year] (70 y.o. Female) Treating RN: Macarthur Critchley Primary Care Physician: Tracie Harrier Other Clinician: Referring Physician: Tracie Harrier Treating Physician/Extender: Frann Rider in Treatment: 0 Vital Signs Time Taken: 10:10 Temperature (F): 97.8 Height (in): 63 Pulse (bpm): 76 Source: Stated Blood Pressure (mmHg): 152/67 Weight (lbs): 155 Reference Range: 80 - 120 mg / dl Source: Stated Body Mass Index (BMI): 27.5 Electronic Signature(s) Signed: 05/28/2015 4:28:54 PM By: Rebecca Eaton RN, Sendra Entered By: Rebecca Eaton RN, Sendra on 05/28/2015 10:10:01

## 2015-05-29 NOTE — Progress Notes (Signed)
[name], [name] ([id number]) Visit Report for 05/28/2015 Abuse/Suicide Risk Screen Details Patient Name: [name], [name]. Date of Service: 05/28/2015 9:30 AM Medical Record Number: [id number] Patient Account Number: [id number] Date of Birth/Sex: 1946-02-02 (69 y.o. Female) Treating RN: Macarthur Critchley Primary Care Physician: Tracie Harrier Other Clinician: Referring Physician: Tracie Harrier Treating Physician/Extender: Frann Rider in Treatment: 0 Abuse/Suicide Risk Screen Items Answer ABUSE/SUICIDE RISK SCREEN: Has anyone close to you tried to hurt or harm you recentlyo No Do you feel uncomfortable with anyone in your familyo No Has anyone forced you do things that you didnot want to doo No Do you have any thoughts of harming yourselfo No Patient displays signs or symptoms of abuse and/or neglect. No Electronic Signature(s) Signed: 05/28/2015 4:28:54 PM By: Rebecca Eaton RN, Sendra Entered By: Rebecca Eaton RN, Sendra on 05/28/2015 10:03:04 [name] ([id number]) -------------------------------------------------------------------------------- Activities of Daily Living Details Patient Name: [name] Date of Service: 05/28/2015 9:30 AM Medical Record Number: [id number] Patient Account Number: [id number] Date of Birth/Sex: 1945-12-19 (69 y.o. Female) Treating RN: Macarthur Critchley Primary Care Physician: Tracie Harrier Other Clinician: Referring Physician: Tracie Harrier Treating Physician/Extender: Frann Rider in Treatment: 0 Activities of Daily Living Items Answer Activities of Daily Living (Please select one for each item) Drive Automobile Completely Able Take Medications Completely Able Use Telephone Completely Able Care for Appearance Completely Able Use Toilet Completely Able Bath / Shower Completely Able Dress Self Completely Able Feed Self Completely Able Walk Completely Able Get In / Out Bed Completely Able Housework  Completely Able Prepare Meals Completely Able Handle Money Completely Able Shop for Self Completely Able Electronic Signature(s) Signed: 05/28/2015 4:28:54 PM By: Rebecca Eaton RN, Sendra Entered By: Rebecca Eaton RN, Sendra on 05/28/2015 10:03:18 [name] ([id number]) -------------------------------------------------------------------------------- Education Assessment Details Patient Name: [name] Date of Service: 05/28/2015 9:30 AM Medical Record Number: [id number] Patient Account Number: [id number] Date of Birth/Sex: 06-Dec-1945 (69 y.o. Female) Treating RN: Macarthur Critchley Primary Care Physician: Tracie Harrier Other Clinician: Referring Physician: Tracie Harrier Treating Physician/Extender: Frann Rider in Treatment: 0 Primary Learner Assessed: Patient Learning Preferences/Education Level/Primary Language Learning Preference: Explanation Highest Education Level: High School Preferred Language: English Cognitive Barrier Assessment/Beliefs Language Barrier: No Translator Needed: No Memory Deficit: No Emotional Barrier: No Cultural/Religious Beliefs Affecting Medical No Care: Physical Barrier Assessment Impaired Vision: Yes Glasses Impaired Hearing: No Decreased Hand dexterity: No Knowledge/Comprehension Assessment Knowledge Level: High Comprehension Level: High Ability to understand written High instructions: Ability to understand verbal High instructions: Motivation Assessment Anxiety Level: Calm Cooperation: Cooperative Interest in Health Problems: Asks Questions Perception: Coherent Willingness to Engage in Self- High Management Activities: Readiness to Engage in Self- High Management Activities: Electronic Signature(s) Signed: 05/28/2015 4:28:54 PM By: Rebecca Eaton RN, Clydia Llano, Erin Hearing ([id number]) Entered By: Rebecca Eaton RN, Sendra on 05/28/2015 10:04:06 [name]  ([id number]) -------------------------------------------------------------------------------- Fall Risk Assessment Details Patient Name: [name] Date of Service: 05/28/2015 9:30 AM Medical Record Number: [id number] Patient Account Number: [id number] Date of Birth/Sex: 11-21-1945 (69 y.o. Female) Treating RN: Macarthur Critchley Primary Care Physician: Tracie Harrier Other Clinician: Referring Physician: Tracie Harrier Treating Physician/Extender: Frann Rider in Treatment: 0 Fall Risk Assessment Items Have you had 2 or more falls in the last 12 monthso 0 No Have you had any fall that resulted in injury in the last 12 monthso 0 No FALL RISK ASSESSMENT: History of falling - immediate or within 3 months 0 No Secondary diagnosis 15 Yes Ambulatory aid None/bed rest/wheelchair/nurse 0 Yes Crutches/cane/walker 0 No  Furniture 0 No IV Access/Saline Lock 0 No Gait/Training Normal/bed rest/immobile 0 Yes Weak 0 No Impaired 0 No Mental Status Oriented to own ability 0 Yes Electronic Signature(s) Signed: 05/28/2015 4:28:54 PM By: Rebecca Eaton, RN, Sendra Entered By: Rebecca Eaton RN, Sendra on 05/28/2015 10:04:19 [name] ([id number]) -------------------------------------------------------------------------------- Foot Assessment Details Patient Name: [name] Date of Service: 05/28/2015 9:30 AM Medical Record Number: [id number] Patient Account Number: [id number] Date of Birth/Sex: [birth year] (69 y.o. Female) Treating RN: Macarthur Critchley Primary Care Physician: Tracie Harrier Other Clinician: Referring Physician: Tracie Harrier Treating Physician/Extender: Frann Rider in Treatment: 0 Foot Assessment Items Site Locations + = Sensation present, - = Sensation absent, C = Callus, U = Ulcer R = Redness, W = Warmth, M = Maceration, PU = Pre-ulcerative lesion F = Fissure, S = Swelling, D = Dryness Assessment Right: Left: Other Deformity: No  No Prior Foot Ulcer: No No Prior Amputation: No No Charcot Joint: No No Ambulatory Status: Ambulatory Without Help Gait: Steady Electronic Signature(s) Signed: 05/28/2015 4:28:54 PM By: Rebecca Eaton, RN, Sendra Entered By: Rebecca Eaton RN, Sendra on 05/28/2015 10:07:01 [name] ([id number]) -------------------------------------------------------------------------------- Nutrition Risk Assessment Details Patient Name: [name] Date of Service: 05/28/2015 9:30 AM Medical Record Number: [id number] Patient Account Number: [id number] Date of Birth/Sex: 14-Aug-1945 (69 y.o. Female) Treating RN: Macarthur Critchley Primary Care Physician: Tracie Harrier Other Clinician: Referring Physician: Tracie Harrier Treating Physician/Extender: Frann Rider in Treatment: 0 Height (in): Weight (lbs): Body Mass Index (BMI): Nutrition Risk Assessment Items NUTRITION RISK SCREEN: I have an illness or condition that made me change the kind and/or 0 No amount of food I eat I eat fewer than two meals per day 0 No I eat few fruits and vegetables, or milk products 0 No I have three or more drinks of beer, liquor or wine almost every day 0 No I have tooth or mouth problems that make it hard for me to eat 0 No I don't always have enough money to buy the food I need 0 No I eat alone most of the time 0 No I take three or more different prescribed or over-the-counter drugs a 1 Yes day Without wanting to, I have lost or gained 10 pounds in the last six 0 No months I am not always physically able to shop, cook and/or feed myself 0 No Nutrition Protocols Good Risk Protocol 0 No interventions needed Provide education on elevated blood sugars Moderate Risk Protocol 0 and impact on wound healing, as applicable Electronic Signature(s) Signed: 05/28/2015 4:28:54 PM By: Rebecca Eaton RN, Sendra Entered By: Rebecca Eaton RN, Sendra on 05/28/2015 10:04:32

## 2015-06-04 ENCOUNTER — Encounter: Payer: Medicare Other | Admitting: Surgery

## 2015-06-04 DIAGNOSIS — E11622 Type 2 diabetes mellitus with other skin ulcer: Secondary | ICD-10-CM | POA: Diagnosis not present

## 2015-06-04 NOTE — Progress Notes (Addendum)
[name], [name] ([id number]) Visit Report for 06/04/2015 Chief Complaint Document Details Patient Name: [name], [name] 06/04/2015 11:00 Date of Service: AM Medical Record [id number] Number: Patient Account Number: [id number] 15-Feb-1946 (70 y.o. Treating RN: Macarthur Critchley Date of Birth/Sex: Female) Other Clinician: Primary Care Physician: Tracie Harrier Treating Jaiyon Wander Referring Physician: Tracie Harrier Physician/Extender: Suella Grove in Treatment: 1 Information Obtained from: Patient Chief Complaint Patient seen for complaints of Non-Healing Wound. the patient had a left lower extremity skin lesion excised in September 2016. She has had a nonhealing wound since Electronic Signature(s) Signed: 06/04/2015 11:16:24 AM By: Christin Fudge MD, FACS Entered By: Christin Fudge on 06/04/2015 11:16:24 [name] ([id number]) -------------------------------------------------------------------------------- Debridement Details Patient Name: [name] 06/04/2015 11:00 Date of Service: AM Medical Record [id number] Number: Patient Account Number: [id number] 04-07-1946 (70 y.o. Treating RN: Macarthur Critchley Date of Birth/Sex: Female) Other Clinician: Primary Care Physician: Tracie Harrier Treating Dezire Turk Referring Physician: Tracie Harrier Physician/Extender: Suella Grove in Treatment: 1 Debridement Performed for Wound #1 Left,Lateral Lower Leg Assessment: Performed By: Physician Christin Fudge, MD Debridement: Debridement Pre-procedure Yes Verification/Time Out Taken: Start Time: 11:08 Pain Control: Lidocaine 4% Topical Solution Level: Skin/Subcutaneous Tissue Total Area Debrided (L x 1.6 (cm) x 2 (cm) = 3.2 (cm) W): Tissue and other Viable, Non-Viable, Exudate, Fibrin/Slough, Subcutaneous material debrided: Instrument: Curette Bleeding: Minimum Hemostasis Achieved: Pressure End Time: 11:09 Procedural Pain: 0 Post Procedural Pain: 0 Response  to Treatment: Procedure was tolerated well Post Debridement Measurements of Total Wound Length: (cm) 1.6 Width: (cm) 2 Depth: (cm) 0.2 Volume: (cm) 0.503 Post Procedure Diagnosis Same as Pre-procedure Electronic Signature(s) Signed: 06/04/2015 11:16:18 AM By: Christin Fudge MD, FACS Signed: 06/04/2015 4:54:11 PM By: Rebecca Eaton RN, Sendra Entered By: Christin Fudge on 06/04/2015 11:16:18 [name] ([id number]) -------------------------------------------------------------------------------- HPI Details Patient Name: [name], [name] 06/04/2015 11:00 Date of Service: AM Medical Record [id number] Number: Patient Account Number: [id number] [birth year] (70 y.o. Treating RN: Macarthur Critchley Date of Birth/Sex: Female) Other Clinician: Primary Care Physician: Tracie Harrier Treating Rayner Erman Referring Physician: Tracie Harrier Physician/Extender: Suella Grove in Treatment: 1 History of Present Illness Location: left lower extremity laterally Quality: Patient reports experiencing a dull pain to affected area(s). Severity: Patient states wound (s) are getting better. Duration: Patient has had the wound for > 3 months prior to seeking treatment at the wound center Timing: Pain in wound is Intermittent (comes and goes Context: The wound occurred when the patient had a squamous cell carcinoma excised and the sutures were removed and the wound opened out Modifying Factors: Other treatment(s) tried include:antibiotics for 2 separate courses Associated Signs and Symptoms: Patient reports having difficulty standing for long periods. HPI Description: 70 year old diabetic whose last hemoglobin A1c was 7.9 in August 2016, has had a skin cancer removed from her left lower extremity in September 2016. The wound got infected was given Keflex and had it for about a month. She continues to have an open ulcerated area on this leg and most recently was seen by her PCP and given doxycycline for  2 weeks. She is a ex-smoker who gave up smoking in 2000. Past medical history significant for anemia, hyperlipidemia, hypothyroidism and as noted diabetes mellitus. She is also status post hysterectomy, appendectomy and carpal tunnel release. the patient has been using some abrasive methods of cleaning the wound with hydrogen peroxide and has been leaving the wound open as per instructions of the dermatologist. She may also have had a skin substitute applied sometime in the past. 06/04/2015 --  Notes received from dermatology from Dr. Elveria Rising office -- previous history of basal cell carcinoma treated surgically and also has history of squamous cell carcinoma and actinic keratosis. Today 65 pages of notes were reviewed by me. Most recent biopsy was for a squamous cell carcinoma on the left lateral distal calf and was seen on September 8 for this. Pathology showed clear margins. Electronic Signature(s) Signed: 06/04/2015 11:16:37 AM By: Christin Fudge MD, FACS Previous Signature: 06/04/2015 9:50:25 AM Version By: Christin Fudge MD, FACS Entered By: Christin Fudge on 06/04/2015 11:16:37 [name] ([id number]) -------------------------------------------------------------------------------- Physical Exam Details Patient Name: [name], [name] 06/04/2015 11:00 Date of Service: AM Medical Record [id number] Number: Patient Account Number: [id number] Jun 11, 1945 (70 y.o. Treating RN: Macarthur Critchley Date of Birth/Sex: Female) Other Clinician: Primary Care Physician: Tracie Harrier Treating Shi Grose Referring Physician: Tracie Harrier Physician/Extender: Weeks in Treatment: 1 Constitutional . Pulse regular. Respirations normal and unlabored. Afebrile. . Eyes Nonicteric. Reactive to light. Ears, Nose, Mouth, and Throat Lips, teeth, and gums WNL.Marland Kitchen Moist mucosa without lesions. Neck supple and nontender. No palpable supraclavicular or cervical adenopathy. Normal sized without  goiter. Respiratory WNL. No retractions.. Cardiovascular Pedal Pulses WNL. No clubbing, cyanosis or edema. Chest Breasts symmetical and no nipple discharge.. Breast tissue WNL, no masses, lumps, or tenderness.. Lymphatic No adneopathy. No adenopathy. No adenopathy. Musculoskeletal Adexa without tenderness or enlargement.. Digits and nails w/o clubbing, cyanosis, infection, petechiae, ischemia, or inflammatory conditions.. Integumentary (Hair, Skin) No suspicious lesions. No crepitus or fluctuance. No peri-wound warmth or erythema. No masses.Marland Kitchen Psychiatric Judgement and insight Intact.. No evidence of depression, anxiety, or agitation.. Notes the wound has some subcutaneous debris and some granulation tissue and I was able to do a good debridement down to the subcutaneous tissue with a curet and bleeding was controlled with pressure Electronic Signature(s) Signed: 06/04/2015 11:17:18 AM By: Christin Fudge MD, FACS Entered By: Christin Fudge on 06/04/2015 11:17:17 [name] ([id number]) -------------------------------------------------------------------------------- Physician Orders Details Patient Name: [name] 06/04/2015 11:00 Date of Service: AM Medical Record [id number] Number: Patient Account Number: [id number] [birth year] (70 y.o. Treating RN: Macarthur Critchley Date of Birth/Sex: Female) Other Clinician: Primary Care Physician: Tracie Harrier Treating Consandra Laske Referring Physician: Tracie Harrier Physician/Extender: Suella Grove in Treatment: 1 Verbal / Phone Orders: Yes Clinician: Macarthur Critchley Read Back and Verified: Yes Diagnosis Coding Wound Cleansing Wound #1 Left,Lateral Lower Leg o Cleanse wound with mild soap and water o May Shower, gently pat wound dry prior to applying new dressing. Skin Barriers/Peri-Wound Care Wound #1 Left,Lateral Lower Leg o Skin Prep Primary Wound Dressing Wound #1 Left,Lateral Lower Leg o Prisma  Ag Secondary Dressing Wound #1 Left,Lateral Lower Leg o Boardered Foam Dressing Dressing Change Frequency Wound #1 Left,Lateral Lower Leg o Change dressing every other day. Follow-up Appointments Wound #1 Left,Lateral Lower Leg o Return Appointment in 1 week. Edema Control Wound #1 Left,Lateral Lower Leg o Other: - elevate leg as much as possible Electronic Signature(s) Signed: 06/04/2015 2:58:24 PM By: Christin Fudge MD, FACS Signed: 06/04/2015 4:54:11 PM By: Rebecca Eaton RN, Clydia Llano, Erin Hearing ([id number]) Entered By: Rebecca Eaton RN, Sendra on 06/04/2015 11:10:30 [name] ([id number]) -------------------------------------------------------------------------------- Problem List Details Patient Name: ARNELLA, GULDEN 06/04/2015 11:00 Date of Service: AM Medical Record [id number] Number: Patient Account Number: [id number] [birth year] (70 y.o. 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 Treating RN: Macarthur Critchley Date of Birth/Sex: Female) Other Clinician: Primary Care Physician: Tracie Harrier Treating Durk Carmen Referring Physician: Tracie Harrier Physician/Extender: Suella Grove in Treatment: 1 Subjective Chief Complaint Information obtained from Patient Patient seen for complaints of Non-Healing Wound. the patient had a left lower extremity skin lesion excised in September 2016. She has had a nonhealing wound since History of Present Illness (HPI) The following HPI elements were documented for the patient's wound: Location: left lower extremity laterally Quality: Patient reports experiencing a dull pain to affected area(s). Severity: Patient states wound (s) are getting better. Duration: Patient has had the wound for > 3 months prior to seeking treatment at the wound center Timing: Pain in wound is Intermittent (comes and goes Context: The wound occurred when the patient had a squamous cell carcinoma excised and the sutures were removed and the wound opened out Modifying Factors: Other treatment(s) tried include:antibiotics for 2 separate courses Associated Signs and Symptoms: Patient reports having difficulty standing for long periods. 70 year old diabetic whose last hemoglobin A1c was 7.9 in August 2016, has had a skin cancer removed from her left lower extremity in September 2016. The wound got infected was given Keflex and had it for about a month. She continues to have an open ulcerated area on this leg and most recently was seen by her PCP and given doxycycline for 2 weeks. She is a ex-smoker who gave up smoking in 2000. Past medical history significant for anemia, hyperlipidemia, hypothyroidism and as noted diabetes mellitus. She is also status post hysterectomy, appendectomy and carpal tunnel release. the patient has been using some abrasive methods  of cleaning the wound with hydrogen peroxide and has been leaving the wound open as per instructions of the dermatologist. She may also have had a skin substitute applied sometime in the past. 06/04/2015 -- Notes received from dermatology from Dr. Elveria Rising office -- previous history of basal cell carcinoma treated surgically and also has history of squamous cell carcinoma and actinic keratosis. Today 65 pages of notes were reviewed by me. Most recent biopsy was for a squamous cell carcinoma on the left lateral distal calf and was seen on September 8 for this. Pathology showed clear margins. ZULEICA, RAYMENT ([id number]) Objective Constitutional Pulse regular. Respirations normal and unlabored. Afebrile. Vitals Time Taken: 10:54 AM, Height: 63 in, Weight: 155 lbs, BMI: 27.5, Temperature: 97.9 F, Pulse: 80 bpm, Blood Pressure: 107/58 mmHg. Eyes Nonicteric. Reactive to light. Ears, Nose, Mouth, and Throat Lips, teeth, and gums WNL.Marland Kitchen Moist mucosa without lesions. Neck supple and nontender. No palpable supraclavicular or cervical adenopathy. Normal sized without goiter. Respiratory WNL. No retractions.. Cardiovascular Pedal Pulses WNL. No clubbing, cyanosis or edema. Chest Breasts symmetical and no nipple discharge.. Breast tissue WNL, no masses, lumps, or tenderness.. Lymphatic No adneopathy. No adenopathy. No adenopathy. Musculoskeletal Adexa without tenderness or enlargement.. Digits and nails w/o clubbing, cyanosis, infection,  petechiae, ischemia, or inflammatory conditions.Marland Kitchen Psychiatric Judgement and insight Intact.. No evidence of depression, anxiety, or agitation.. General Notes: the wound has some subcutaneous debris and some granulation tissue and I was able to do a good debridement down to the subcutaneous tissue with a curet and bleeding was controlled with pressure Integumentary (Hair, Skin) No suspicious lesions. No crepitus or fluctuance. No peri-wound warmth or  erythema. No masses.. Wound #1 status is Open. Original cause of wound was Other Lesion. The wound is located on the Left,Lateral Lower Leg. The wound measures 1.6cm length x 2cm width x 0.1cm depth; 2.513cm^2 area SAMNANG, PILI. ([id number]) and 0.251cm^3 volume. The wound is limited to skin breakdown. There is no tunneling or undermining noted. There is a large amount of serosanguineous drainage noted. The wound margin is flat and intact. There is medium (34-66%) red granulation within the wound bed. There is a medium (34-66%) amount of necrotic tissue within the wound bed including Adherent Slough. The periwound skin appearance exhibited: Erythema. The periwound skin appearance did not exhibit: Callus, Crepitus, Excoriation, Fluctuance, Friable, Induration, Localized Edema, Rash, Scarring, Dry/Scaly, Maceration, Moist, Atrophie Blanche, Cyanosis, Ecchymosis, Hemosiderin Staining, Mottled, Pallor, Rubor. The surrounding wound skin color is noted with erythema which is circumferential. Periwound temperature was noted as No Abnormality. The periwound has tenderness on palpation. Assessment Active Problems ICD-10 E11.622 - Type 2 diabetes mellitus with other skin ulcer T81.31XA - Disruption of external operation (surgical) wound, not elsewhere classified, initial encounter L97.222 - Non-pressure chronic ulcer of left calf with fat layer exposed C44.729 - Squamous cell carcinoma of skin of left lower limb, including hip Procedures Wound #1 Wound #1 is an Atypical located on the Left,Lateral Lower Leg . There was a Skin/Subcutaneous Tissue Debridement [id number]) debridement with total area of 3.2 sq cm performed by Christin Fudge, MD. with the following instrument(s): Curette to remove Viable and Non-Viable tissue/material including Exudate, Fibrin/Slough, and Subcutaneous after achieving pain control using Lidocaine 4% Topical Solution. A time out was conducted prior to the start of the  procedure. A Minimum amount of bleeding was controlled with Pressure. The procedure was tolerated well with a pain level of 0 throughout and a pain level of 0 following the procedure. Post Debridement Measurements: 1.6cm length x 2cm width x 0.2cm depth; 0.503cm^3 volume. Post procedure Diagnosis Wound #1: Same as Pre-Procedure Plan Wound Cleansing: ALEECE, ZACARIAS ([id number]) Wound #1 Left,Lateral Lower Leg: Cleanse wound with mild soap and water May Shower, gently pat wound dry prior to applying new dressing. Skin Barriers/Peri-Wound Care: Wound #1 Left,Lateral Lower Leg: Skin Prep Primary Wound Dressing: Wound #1 Left,Lateral Lower Leg: Prisma Ag Secondary Dressing: Wound #1 Left,Lateral Lower Leg: Boardered Foam Dressing Dressing Change Frequency: Wound #1 Left,Lateral Lower Leg: Change dressing every other day. Follow-up Appointments: Wound #1 Left,Lateral Lower Leg: Return Appointment in 1 week. Edema Control: Wound #1 Left,Lateral Lower Leg: Other: - elevate leg as much as possible I have recommended: 1. Dressing on every other day with Prisma AG and a bordered foam dressing 2. She is allowed to wash with soap and water and do the dressing sooner she is out of the shower. 3. good control of her diabetes mellitus 4. Elevation of the limb as often as possible 5. He is back in the wound center on weekly intervals. Electronic Signature(s) Signed: 06/04/2015 11:17:43 AM By: Christin Fudge MD, FACS Entered By: Christin Fudge on 06/04/2015 11:17:43 [name] ([id number]) -------------------------------------------------------------------------------- SuperBill Details Patient Name: [name] Date of  Service: 06/04/2015 Medical Record Number: [id number] Patient Account Number: [id number] Date of Birth/Sex: [birth year] (70 y.o. Female) Treating RN: Macarthur Critchley Primary Care Physician: Tracie Harrier Other Clinician: Referring Physician: Tracie Harrier Treating Physician/Extender: Frann Rider in Treatment: 1 Diagnosis Coding ICD-10 Codes Code Description E11.622 Type 2 diabetes mellitus with other skin ulcer Disruption of external operation (surgical) wound, not elsewhere classified, initial T81.31XA encounter L97.222 Non-pressure chronic ulcer of left calf with fat layer exposed C44.729 Squamous cell carcinoma of skin of left lower limb, including hip Facility Procedures CPT4: Description Modifier Quantity Code [id number] - DEB SUBQ TISSUE 20 SQ CM/< 1 ICD-10 Description Diagnosis E11.622 Type 2 diabetes mellitus with other skin ulcer T81.31XA Disruption of external operation (surgical) wound, not elsewhere  classified, initial encounter L97.222 Non-pressure chronic ulcer of left calf with fat layer exposed C44.729 Squamous cell carcinoma of skin of left lower limb, including hip Physician Procedures CPT4: Description Modifier Quantity Code [id number] - WC PHYS SUBQ TISS 20 SQ CM 1 ICD-10 Description Diagnosis E11.622 Type 2 diabetes mellitus with other skin ulcer T81.31XA Disruption of external operation (surgical) wound, not elsewhere  classified, initial encounter L97.222 Non-pressure chronic ulcer of left calf with fat layer exposed C44.729 Squamous cell carcinoma of skin of left lower limb, including hip Electronic Signature(s) Signed: 06/04/2015 11:18:14 AM By: Christin Fudge MD, FACS [name] ([id number]) Previous Signature: 06/04/2015 11:17:57 AM Version By: Christin Fudge MD, FACS Entered By: Christin Fudge on 06/04/2015 11:18:14

## 2015-06-05 NOTE — Progress Notes (Signed)
[name], [name] ([id number]) Visit Report for 06/04/2015 Arrival Information Details Patient Name: [name], [name] 06/04/2015 11:00 Date of Service: AM Medical Record [id number] Number: Patient Account Number: [id number] Mar 10, 1946 (70 y.o. Treating RN: [name] Date of Birth/Sex: Female) Other Clinician: Primary Care Physician: [name] Treating [name], [name] Referring Physician: Tracie [name] Physician/Extender: [name] in Treatment: 1 Visit Information History Since Last Visit All ordered tests and consults were completed: No Patient Arrived: Ambulatory Added or deleted any medications: No Arrival Time: 10:52 Any new allergies or adverse reactions: No Accompanied By: husband Had a fall or experienced change in No Transfer Assistance: None activities of daily living that may affect Patient Identification Verified: Yes risk of falls: Secondary Verification Process Yes Signs or symptoms of abuse/neglect since last No Completed: visito Patient Has Alerts: Yes Hospitalized since last visit: No Patient Alerts: Patient on Blood Has Dressing in Place as Prescribed: Yes Thinner Pain Present Now: No 05/28/15 ABI .99 Right 05/28/15 ABI .99 Left Electronic Signature(s) Signed: 06/04/2015 4:54:11 PM By: [name] Eaton RN, [name] Entered By: [name] Eaton RN, [name] on 06/04/2015 10:53:04 [name] ([id number]) -------------------------------------------------------------------------------- Encounter Discharge Information Details Patient Name: [name], [name] 06/04/2015 11:00 Date of Service: AM Medical Record [id number] Number: Patient Account Number: [id number] [birth year] (70 y.o. Treating RN: [name] Date of Birth/Sex: Female) Other Clinician: Primary Care Physician: [name] Treating [name], [name] Referring Physician: Tracie [name] Physician/Extender: [name] in Treatment: 1 Encounter Discharge Information Items Discharge  Pain Level: 0 Discharge Condition: Stable Ambulatory Status: Ambulatory Discharge Destination: Home Transportation: Private Auto Accompanied By: spouse Schedule Follow-up Appointment: Yes Medication Reconciliation completed and provided to Patient/Care Yes [name]: Provided on Clinical Summary of Care: 06/04/2015 Form Type Recipient Paper Patient ER Electronic Signature(s) Signed: 06/04/2015 4:54:11 PM By: [name] Eaton RN, [name] Previous Signature: 06/04/2015 11:16:23 AM Version By: Ruthine Dose Entered By: [name] Eaton RN, [name] on 06/04/2015 11:18:58 [name] ([id number]) -------------------------------------------------------------------------------- Lower Extremity Assessment Details Patient Name: [name], [name] 06/04/2015 11:00 Date of Service: AM Medical Record [id number] Number: Patient Account Number: [id number] Jun 20, 1945 (70 y.o. Treating RN: [name] Date of Birth/Sex: Female) Other Clinician: Primary Care Physician: [name] Treating [name], [name] Referring Physician: Tracie [name] Physician/Extender: [name] in Treatment: 1 Edema Assessment Assessed: [Left: No] [Right: No] Edema: [Left: N] [Right: o] Calf Left: Right: Point of Measurement: 28 cm From Medial Instep 34 cm cm Ankle Left: Right: Point of Measurement: 10 cm From Medial Instep 21 cm cm Vascular Assessment Pulses: Posterior Tibial Dorsalis Pedis Palpable: [Left:Yes] Extremity colors, hair growth, and conditions: Extremity Color: [Left:Normal] Hair Growth on Extremity: [Left:Yes] Temperature of Extremity: [Left:Warm] Capillary Refill: [Left:< 3 seconds] Toe Nail Assessment Left: Right: Thick: No Discolored: No Deformed: No Improper Length and Hygiene: No Electronic Signature(s) Signed: 06/04/2015 4:54:11 PM By: [name] Eaton, RN, [name] Entered By: [name] Eaton RN, [name] on 06/04/2015 11:00:15 [name] ([name]  ([id number]) -------------------------------------------------------------------------------- Pain Assessment Details Patient Name: [name], [name] 06/04/2015 11:00 Date of Service: AM Medical Record [id number] Number: Patient Account Number: [id number] 1945-09-27 (70 y.o. Treating RN: [name] Date of Birth/Sex: Female) Other Clinician: Primary Care Physician: [name] Treating [name], [name] Referring Physician: Tracie [name] Physician/Extender: [name] in Treatment: 1 Active Problems Location of Pain Severity and Description of Pain Patient Has Paino No Site Locations Rate the pain. Current Pain Level: 0 Pain Management and Medication Current Pain Management: Electronic Signature(s) Signed: 06/04/2015 4:54:11 PM By: [name] Eaton, RN, [name] Entered By: [name] Eaton RN, [name] on 06/04/2015 10:53:12 [name] ([id number]) --------------------------------------------------------------------------------  Patient/Caregiver Education Details Patient Name: [name], [name] 06/04/2015 11:00 Date of Service: AM Medical Record [id number] Number: Patient Account Number: [id number] [birth year] (70 y.o. Treating RN: [name] Date of Birth/Gender: Female) Other Clinician: Primary Care Physician: [name] Treating [name], [name] Referring Physician: Tracie [name] Physician/Extender: [name] in Treatment: 1 Education Assessment Education Provided To: Patient and Caregiver husband Education Topics Provided Wound/Skin Impairment: Handouts: Caring for Your Ulcer, Skin Care Do's and Dont's Methods: Explain/Verbal Responses: State content correctly Electronic Signature(s) Signed: 06/04/2015 4:54:11 PM By: [name] Eaton, RN, [name] Entered By: [name] Eaton RN, [name] on 06/04/2015 11:19:14 [name] ([id number]) -------------------------------------------------------------------------------- Wound Assessment Details Patient Name: [name], [name] 06/04/2015 11:00 Date of Service: AM Medical Record [id number] Number: Patient Account Number: [id number] 10/27/1945 (70 y.o. Treating RN: [name] Date of Birth/Sex: Female) Other Clinician: Primary Care Physician: [name] Treating [name], [name] Referring Physician: Tracie [name] Physician/Extender: Weeks in Treatment: 1 Wound Status Wound Number: 1 Primary Etiology: Atypical Wound Location: Left Lower Leg - Lateral Wound Status: Open Wounding Event: Other Lesion Comorbid History: Cataracts, Type II Diabetes Date Acquired: 12/21/2014 Weeks Of Treatment: 1 Clustered Wound: No Photos Photo Uploaded By: [name] Eaton RN, Roslynn Amble on 06/04/2015 16:51:51 Wound Measurements Length: (cm) 1.6 Width: (cm) 2 Depth: (cm) 0.1 Area: (cm) 2.513 Volume: (cm) 0.251 % Reduction in Area: 36% % Reduction in Volume: 36.1% Epithelialization: Small (1-33%) Tunneling: No Undermining: No Wound Description Full Thickness Without Exposed Foul Odor Classification: Support Structures Diabetic Severity Grade 1 (Wagner): Wound Margin: Flat and Intact Exudate Amount: Large Exudate Type: Serosanguineous Exudate Color: red, brown After Cleansing: No Wound Bed [name], [name]. ([id number]) Granulation Amount: Medium (34-66%) Exposed Structure Granulation Quality: Red Fascia Exposed: No Necrotic Amount: Medium (34-66%) Fat Layer Exposed: No Necrotic Quality: Adherent Slough Tendon Exposed: No Muscle Exposed: No Joint Exposed: No Bone Exposed: No Limited to Skin Breakdown Periwound Skin Texture Texture Color No Abnormalities Noted: No No Abnormalities Noted: No Callus: No Atrophie Blanche: No Crepitus: No Cyanosis: No Excoriation: No Ecchymosis: No Fluctuance: No Erythema: Yes Friable: No Erythema Location: Circumferential Induration: No Hemosiderin Staining: No Localized Edema: No Mottled: No Rash: No Pallor: No Scarring: No Rubor: No Moisture  Temperature / Pain No Abnormalities Noted: No Temperature: No Abnormality Dry / Scaly: No Tenderness on Palpation: Yes Maceration: No Moist: No Wound Preparation Ulcer Cleansing: Rinsed/Irrigated with Saline Topical Anesthetic Applied: Other: lidocaine 4%, Treatment Notes Wound #1 (Left, Lateral Lower Leg) 1. Cleansed with: Clean wound with Normal Saline 2. Anesthetic Topical Lidocaine 4% cream to wound bed prior to debridement 3. Peri-wound Care: Barrier cream 4. Dressing Applied: Prisma Ag 5. Secondary Dressing Applied Bordered Foam Dressing Electronic Signature(s) Signed: 06/04/2015 4:54:11 PM By: [name] Eaton RN, Clydia Llano, Erin Hearing ([id number]) Entered By: [name] Eaton RN, [name] on 06/04/2015 11:01:11 [name] ([id number]) -------------------------------------------------------------------------------- Vitals Details Patient Name: [name], [name] 06/04/2015 11:00 Date of Service: AM Medical Record [id number] Number: Patient Account Number: [id number] 11-05-1945 (70 y.o. Treating RN: [name] Date of Birth/Sex: Female) Other Clinician: Primary Care Physician: [name] Treating [name], [name] Referring Physician: Tracie [name] Physician/Extender: Weeks in Treatment: 1 Vital Signs Time Taken: 10:54 Temperature (F): 97.9 Height (in): 63 Pulse (bpm): 80 Weight (lbs): 155 Blood Pressure (mmHg): 107/58 Body Mass Index (BMI): 27.5 Reference Range: 80 - 120 mg / dl Electronic Signature(s) Signed: 06/04/2015 4:54:11 PM By: [name] Eaton, RN, [name] Entered By: [name] Eaton RN, [name] on 06/04/2015 10:56:19

## 2015-06-11 ENCOUNTER — Encounter: Payer: Medicare Other | Admitting: Surgery

## 2015-06-11 DIAGNOSIS — E11622 Type 2 diabetes mellitus with other skin ulcer: Secondary | ICD-10-CM | POA: Diagnosis not present

## 2015-06-11 NOTE — Progress Notes (Signed)
[name], [name] ([id number]) Visit Report for 06/11/2015 Arrival Information Details Patient Name: [name], [name] 06/11/2015 10:45 Date of Service: AM Medical Record [id number] Number: Patient Account Number: [id number] 28-May-1945 (70 y.o. Treating RN: Macarthur Critchley Date of Birth/Sex: Female) Other Clinician: Primary Care Physician: Tracie Harrier Treating Britto, Errol Referring Physician: Tracie Harrier Physician/Extender: Suella Grove in Treatment: 2 Visit Information History Since Last Visit All ordered tests and consults were completed: No Patient Arrived: Ambulatory Added or deleted any medications: No Arrival Time: 10:46 Any new allergies or adverse reactions: No Accompanied By: self Had a fall or experienced change in No Transfer Assistance: None activities of daily living that may affect Patient Identification Verified: Yes risk of falls: Secondary Verification Process Yes Signs or symptoms of abuse/neglect since last No Completed: visito Patient Has Alerts: Yes Hospitalized since last visit: No Patient Alerts: Patient on Blood Has Dressing in Place as Prescribed: Yes Thinner Pain Present Now: No 05/28/15 ABI .99 Right 05/28/15 ABI .99 Left Electronic Signature(s) Signed: 06/11/2015 4:13:06 PM By: Rebecca Eaton RN, Sendra Entered By: Rebecca Eaton RN, Sendra on 06/11/2015 10:47:18 [name] ([id number]) -------------------------------------------------------------------------------- Encounter Discharge Information Details Patient Name: [name], [name] 06/11/2015 10:45 Date of Service: AM Medical Record [id number] Number: Patient Account Number: [id number] 10-Dec-1945 (70 y.o. Treating RN: Macarthur Critchley Date of Birth/Sex: Female) Other Clinician: Primary Care Physician: Tracie Harrier Treating Britto, Errol Referring Physician: Tracie Harrier Physician/Extender: Suella Grove in Treatment: 2 Encounter Discharge Information Items Discharge Pain  Level: 0 Discharge Condition: Stable Ambulatory Status: Ambulatory Discharge Destination: Home Private Transportation: Auto Accompanied By: spouse Schedule Follow-up Appointment: Yes Medication Reconciliation completed and Yes provided to Patient/Care Jeshua Ransford: Clinical Summary of Care: Electronic Signature(s) Signed: 06/11/2015 4:13:06 PM By: Rebecca Eaton RN, Sendra Entered By: Rebecca Eaton RN, Sendra on 06/11/2015 11:14:37 [name] ([id number]) -------------------------------------------------------------------------------- Lower Extremity Assessment Details Patient Name: [name], [name] 06/11/2015 10:45 Date of Service: AM Medical Record [id number] Number: Patient Account Number: [id number] Aug 06, 1945 (70 y.o. Treating RN: Macarthur Critchley Date of Birth/Sex: Female) Other Clinician: Primary Care Physician: Tracie Harrier Treating Britto, Errol Referring Physician: Tracie Harrier Physician/Extender: Suella Grove in Treatment: 2 Edema Assessment Assessed: [Left: No] [Right: No] Edema: [Left: N] [Right: o] Calf Left: Right: Point of Measurement: 28 cm From Medial Instep 34 cm cm Ankle Left: Right: Point of Measurement: 10 cm From Medial Instep 21 cm cm Vascular Assessment Pulses: Posterior Tibial Dorsalis Pedis Palpable: [Left:Yes] Extremity colors, hair growth, and conditions: Extremity Color: [Left:Normal] Hair Growth on Extremity: [Left:Yes] Temperature of Extremity: [Left:Warm] Capillary Refill: [Left:< 3 seconds] Toe Nail Assessment Left: Right: Thick: No Discolored: No Deformed: No Improper Length and Hygiene: No Electronic Signature(s) Signed: 06/11/2015 4:13:06 PM By: Rebecca Eaton, RN, Sendra Entered By: Rebecca Eaton RN, Sendra on 06/11/2015 10:55:20 Litzinger, Erin [name] ([name] ([id number]) -------------------------------------------------------------------------------- Pain Assessment Details Patient Name: [name]  06/11/2015 10:45 Date of Service: AM Medical Record [id number] Number: Patient Account Number: [id number] [birth year] (70 y.o. Treating RN: Macarthur Critchley Date of Birth/Sex: Female) Other Clinician: Primary Care Physician: Tracie Harrier Treating Britto, Errol Referring Physician: Tracie Harrier Physician/Extender: Suella Grove in Treatment: 2 Active Problems Location of Pain Severity and Description of Pain Patient Has Paino No Site Locations Rate the pain. Current Pain Level: 0 Pain Management and Medication Current Pain Management: Electronic Signature(s) Signed: 06/11/2015 4:13:06 PM By: Rebecca Eaton, RN, Sendra Entered By: Rebecca Eaton RN, Sendra on 06/11/2015 10:47:26 [name] ([id number]) -------------------------------------------------------------------------------- Patient/Caregiver Education Details Patient Name: [name], [name] 06/11/2015 10:45 Date of Service: AM Medical Record [id number]  Number: Patient Account Number: [id number] [birth year] (70 y.o. Treating RN: Macarthur Critchley Date of Birth/Gender: Female) Other Clinician: Primary Care Physician: Tracie Harrier Treating Britto, Errol Referring Physician: Tracie Harrier Physician/Extender: Suella Grove in Treatment: 2 Education Assessment Education Provided To: Patient Education Topics Provided Wound/Skin Impairment: Handouts: Caring for Your Ulcer, Skin Care Do's and Dont's Methods: Explain/Verbal Responses: State content correctly Electronic Signature(s) Signed: 06/11/2015 4:13:06 PM By: Rebecca Eaton, RN, Sendra Entered By: Rebecca Eaton RN, Sendra on 06/11/2015 11:15:34 [name] ([id number]) -------------------------------------------------------------------------------- Wound Assessment Details Patient Name: [name], [name] 06/11/2015 10:45 Date of Service: AM Medical Record [id number] Number: Patient Account Number: [id number] 21-Dec-1945 (70 y.o. Treating RN: Macarthur Critchley Date of  Birth/Sex: Female) Other Clinician: Primary Care Physician: Tracie Harrier Treating Britto, Errol Referring Physician: Tracie Harrier Physician/Extender: Weeks in Treatment: 2 Wound Status Wound Number: 1 Primary Etiology: Atypical Wound Location: Left Lower Leg - Lateral Wound Status: Open Wounding Event: Other Lesion Comorbid History: Cataracts, Type II Diabetes Date Acquired: 12/21/2014 Weeks Of Treatment: 2 Clustered Wound: No Photos Photo Uploaded By: Rebecca Eaton RN, Roslynn Amble on 06/11/2015 15:52:13 Wound Measurements Length: (cm) 1.1 Width: (cm) 1.3 Depth: (cm) 0.1 Area: (cm) 1.123 Volume: (cm) 0.112 % Reduction in Area: 71.4% % Reduction in Volume: 71.5% Epithelialization: Medium (34-66%) Tunneling: No Undermining: No Wound Description Full Thickness Without Exposed Foul Odor Classification: Support Structures Diabetic Severity Grade 1 (Wagner): Wound Margin: Flat and Intact Exudate Amount: Medium Exudate Type: Serosanguineous Exudate Color: red, brown After Cleansing: No Wound Bed [name], BARUA. ([id number]) Granulation Amount: Medium (34-66%) Exposed Structure Granulation Quality: Red Fascia Exposed: No Necrotic Amount: Small (1-33%) Fat Layer Exposed: No Necrotic Quality: Adherent Slough Tendon Exposed: No Muscle Exposed: No Joint Exposed: No Bone Exposed: No Limited to Skin Breakdown Periwound Skin Texture Texture Color No Abnormalities Noted: No No Abnormalities Noted: No Callus: No Atrophie Blanche: No Crepitus: No Cyanosis: No Excoriation: No Ecchymosis: No Fluctuance: No Erythema: Yes Friable: No Erythema Location: Circumferential Induration: No Hemosiderin Staining: No Localized Edema: No Mottled: No Rash: No Pallor: No Scarring: No Rubor: No Moisture Temperature / Pain No Abnormalities Noted: No Temperature: No Abnormality Dry / Scaly: No Tenderness on Palpation: Yes Maceration: No Moist: No Wound  Preparation Ulcer Cleansing: Rinsed/Irrigated with Saline Topical Anesthetic Applied: Other: lidocaine 4%, Treatment Notes Wound #1 (Left, Lateral Lower Leg) 1. Cleansed with: Clean wound with Normal Saline 2. Anesthetic Topical Lidocaine 4% cream to wound bed prior to debridement 4. Dressing Applied: Prisma Ag 5. Secondary Dressing Applied Bordered Foam Dressing Electronic Signature(s) Signed: 06/11/2015 4:13:06 PM By: Rebecca Eaton, RN, Sendra Entered By: Rebecca Eaton RN, Sendra on 06/11/2015 10:52:33 Medley, [name] ([id number]) [name], [name] ([id number]) -------------------------------------------------------------------------------- Vitals Details Patient Name: [name], [name] 06/11/2015 10:45 Date of Service: AM Medical Record [id number] Number: Patient Account Number: [id number] Jul 15, 1945 (70 y.o. Treating RN: Macarthur Critchley Date of Birth/Sex: Female) Other Clinician: Primary Care Physician: Tracie Harrier Treating Britto, Errol Referring Physician: Tracie Harrier Physician/Extender: Weeks in Treatment: 2 Vital Signs Time Taken: 10:50 Temperature (F): 98.7 Height (in): 63 Pulse (bpm): 82 Weight (lbs): 155 Blood Pressure (mmHg): 131/65 Body Mass Index (BMI): 27.5 Reference Range: 80 - 120 mg / dl Electronic Signature(s) Signed: 06/11/2015 4:13:06 PM By: Rebecca Eaton RN, Sendra Entered By: Rebecca Eaton RN, Sendra on 06/11/2015 11:19:27

## 2015-06-12 NOTE — Progress Notes (Addendum)
SIGOURNEY, JABLONOWSKI ([id number]) Visit Report for 06/11/2015 Chief Complaint Document Details Patient Name: [name], [name] 06/11/2015 10:45 Date of Service: AM Medical Record [id number] Number: Patient Account Number: [id number] 09/26/1945 (70 y.o. Treating RN: Macarthur Critchley Date of Birth/Sex: Female) Other Clinician: Primary Care Physician: Tracie Harrier Treating Lillyn Wieczorek Referring Physician: Tracie Harrier Physician/Extender: Suella Grove in Treatment: 2 Information Obtained from: Patient Chief Complaint Patient seen for complaints of Non-Healing Wound. the patient had a left lower extremity skin lesion excised in September 2016. She has had a nonhealing wound since Electronic Signature(s) Signed: 06/11/2015 11:14:41 AM By: Christin Fudge MD, FACS Entered By: Christin Fudge on 06/11/2015 11:14:41 [name] ([id number]) -------------------------------------------------------------------------------- Debridement Details Patient Name: [name] 06/11/2015 10:45 Date of Service: AM Medical Record [id number] Number: Patient Account Number: [id number] Jun 27, 1945 (70 y.o. Treating RN: Macarthur Critchley Date of Birth/Sex: Female) Other Clinician: Primary Care Physician: Tracie Harrier Treating Khandi Kernes Referring Physician: Tracie Harrier Physician/Extender: Suella Grove in Treatment: 2 Debridement Performed for Wound #1 Left,Lateral Lower Leg Assessment: Performed By: Physician Christin Fudge, MD Debridement: Debridement Pre-procedure Yes Verification/Time Out Taken: Start Time: 11:08 Pain Control: Lidocaine 4% Topical Solution Level: Skin/Subcutaneous Tissue Total Area Debrided (L x 1.1 (cm) x 1.3 (cm) = 1.43 (cm) W): Tissue and other Viable, Non-Viable, Fibrin/Slough, Subcutaneous material debrided: Instrument: Forceps Bleeding: Minimum End Time: 11:10 Procedural Pain: 0 Post Procedural Pain: 0 Response to Treatment: Procedure was  tolerated well Post Debridement Measurements of Total Wound Length: (cm) 1.1 Width: (cm) 1.4 Depth: (cm) 0.1 Volume: (cm) 0.121 Post Procedure Diagnosis Same as Pre-procedure Electronic Signature(s) Signed: 06/11/2015 11:14:29 AM By: Christin Fudge MD, FACS Signed: 06/11/2015 4:13:06 PM By: Rebecca Eaton RN, Sendra Entered By: Christin Fudge on 06/11/2015 11:14:28 [name] ([id number]) -------------------------------------------------------------------------------- HPI Details Patient Name: [name] 06/11/2015 10:45 Date of Service: AM Medical Record [id number] Number: Patient Account Number: [id number] 05-Jun-1945 (70 y.o. Treating RN: Macarthur Critchley Date of Birth/Sex: Female) Other Clinician: Primary Care Physician: Tracie Harrier Treating Tedra Coppernoll Referring Physician: Tracie Harrier Physician/Extender: Suella Grove in Treatment: 2 History of Present Illness Location: left lower extremity laterally Quality: Patient reports experiencing a dull pain to affected area(s). Severity: Patient states wound (s) are getting better. Duration: Patient has had the wound for > 3 months prior to seeking treatment at the wound center Timing: Pain in wound is Intermittent (comes and goes Context: The wound occurred when the patient had a squamous cell carcinoma excised and the sutures were removed and the wound opened out Modifying Factors: Other treatment(s) tried include:antibiotics for 2 separate courses Associated Signs and Symptoms: Patient reports having difficulty standing for long periods. HPI Description: 70 year old diabetic whose last hemoglobin A1c was 7.9 in August 2016, has had a skin cancer removed from her left lower extremity in September 2016. The wound got infected was given Keflex and had it for about a month. She continues to have an open ulcerated area on this leg and most recently was seen by her PCP and given doxycycline for 2 weeks. She is a  ex-smoker who gave up smoking in 2000. Past medical history significant for anemia, hyperlipidemia, hypothyroidism and as noted diabetes mellitus. She is also status post hysterectomy, appendectomy and carpal tunnel release. the patient has been using some abrasive methods of cleaning the wound with hydrogen peroxide and has been leaving the wound open as per instructions of the dermatologist. She may also have had a skin substitute applied sometime in the past. 06/04/2015 -- Notes received from dermatology  from Dr. Elveria Rising office -- previous history of basal cell carcinoma treated surgically and also has history of squamous cell carcinoma and actinic keratosis. Today 65 pages of notes were reviewed by me. Most recent biopsy was for a squamous cell carcinoma on the left lateral distal calf and was seen on September 8 for this. Pathology showed clear margins. Electronic Signature(s) Signed: 06/11/2015 11:14:48 AM By: Christin Fudge MD, FACS Entered By: Christin Fudge on 06/11/2015 11:14:47 [name] ([id number]) -------------------------------------------------------------------------------- Physical Exam Details Patient Name: [name], [name] 06/11/2015 10:45 Date of Service: AM Medical Record [id number] Number: Patient Account Number: [id number] April 08, 1946 (70 y.o. Treating RN: Macarthur Critchley Date of Birth/Sex: Female) Other Clinician: Primary Care Physician: Tracie Harrier Treating Cienna Dumais Referring Physician: Tracie Harrier Physician/Extender: Weeks in Treatment: 2 Constitutional . Pulse regular. Respirations normal and unlabored. Afebrile. . Eyes Nonicteric. Reactive to light. Ears, Nose, Mouth, and Throat Lips, teeth, and gums WNL.Marland Kitchen Moist mucosa without lesions. Neck supple and nontender. No palpable supraclavicular or cervical adenopathy. Normal sized without goiter. Respiratory WNL. No retractions.. Cardiovascular Pedal Pulses WNL. No clubbing,  cyanosis or edema. Lymphatic No adneopathy. No adenopathy. No adenopathy. Musculoskeletal Adexa without tenderness or enlargement.. Digits and nails w/o clubbing, cyanosis, infection, petechiae, ischemia, or inflammatory conditions.. Integumentary (Hair, Skin) No suspicious lesions. No crepitus or fluctuance. No peri-wound warmth or erythema. No masses.Marland Kitchen Psychiatric Judgement and insight Intact.. No evidence of depression, anxiety, or agitation.. Notes the wound in the left lower extremity is looking much cleaner and there is healthy granulation tissue except for a few areas where there is debris down to the subcutaneous tissue and this was sharply removed with a toothed forcep. Bleeding was controlled with pressure. Electronic Signature(s) Signed: 06/11/2015 11:15:33 AM By: Christin Fudge MD, FACS Entered By: Christin Fudge on 06/11/2015 11:15:31 [name] ([id number]) -------------------------------------------------------------------------------- Physician Orders Details Patient Name: [name], [name] 06/11/2015 10:45 Date of Service: AM Medical Record [id number] Number: Patient Account Number: [id number] 08-12-1945 (70 y.o. Treating RN: Macarthur Critchley Date of Birth/Sex: Female) Other Clinician: Primary Care Physician: Tracie Harrier Treating Keilana Morlock Referring Physician: Tracie Harrier Physician/Extender: Suella Grove in Treatment: 2 Verbal / Phone Orders: Yes Clinician: Macarthur Critchley Read Back and Verified: Yes Diagnosis Coding Wound Cleansing Wound #1 Left,Lateral Lower Leg o Cleanse wound with mild soap and water o May Shower, gently pat wound dry prior to applying new dressing. Primary Wound Dressing Wound #1 Left,Lateral Lower Leg o Prisma Ag Secondary Dressing Wound #1 Left,Lateral Lower Leg o Boardered Foam Dressing Dressing Change Frequency Wound #1 Left,Lateral Lower Leg o Change dressing every other day. Follow-up  Appointments Wound #1 Left,Lateral Lower Leg o Return Appointment in 1 week. Edema Control Wound #1 Left,Lateral Lower Leg o Other: - elevate leg as much as possible Electronic Signature(s) Signed: 06/11/2015 4:13:06 PM By: Ardean Larsen Signed: 06/11/2015 4:45:18 PM By: Christin Fudge MD, FACS Entered By: Rebecca Eaton RN, Sendra on 06/11/2015 11:10:49 [name] ([id number]) -------------------------------------------------------------------------------- Problem List Details Patient Name: [name], [name] 06/11/2015 10:45 Date of Service: AM Medical Record [id number] Number: Patient Account Number: [id number] [birth year] (70 y.o. 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 Treating RN: Macarthur Critchley Date of Birth/Sex: Female) Other Clinician: Primary Care Physician: Tracie Harrier Treating Kaileah Shevchenko,  Andersen Iorio Referring Physician: Tracie Harrier Physician/Extender: Suella Grove in Treatment: 2 Subjective Chief Complaint Information obtained from Patient Patient seen for complaints of Non-Healing Wound. the patient had a left lower extremity skin lesion excised in September 2016. She has had a nonhealing wound since History of Present Illness (HPI) The following HPI elements were documented for the patient's wound: Location: left lower extremity laterally Quality: Patient reports experiencing a dull pain to affected area(s). Severity: Patient states wound (s) are getting better. Duration: Patient has had the wound for > 3 months prior to seeking treatment at the wound center Timing: Pain in wound is Intermittent (comes and goes Context: The wound occurred when the patient had a squamous cell carcinoma excised and the sutures were removed and the wound opened out Modifying Factors: Other treatment(s) tried include:antibiotics for 2 separate courses Associated Signs and Symptoms: Patient reports having difficulty standing for long periods. 70 year old diabetic whose last hemoglobin A1c was 7.9 in August 2016, has had a skin cancer removed from her left lower extremity in September 2016. The wound got infected was given Keflex and had it for about a month. She continues to have an open ulcerated area on this leg and most recently was seen by her PCP and given doxycycline for 2 weeks. She is a ex-smoker who gave up smoking in 2000. Past medical history significant for anemia, hyperlipidemia, hypothyroidism and as noted diabetes mellitus. She is also status post hysterectomy, appendectomy and carpal tunnel release. the patient has been using some abrasive methods of cleaning the wound with hydrogen peroxide and has been leaving the wound open as per instructions of the dermatologist. She may also have had a skin substitute applied sometime in the past. 06/04/2015 -- Notes received from  dermatology from Dr. Elveria Rising office -- previous history of basal cell carcinoma treated surgically and also has history of squamous cell carcinoma and actinic keratosis. Today 65 pages of notes were reviewed by me. Most recent biopsy was for a squamous cell carcinoma on the left lateral distal calf and was seen on September 8 for this. Pathology showed clear margins. [name], [name] ([id number]) Objective Constitutional Pulse regular. Respirations normal and unlabored. Afebrile. Vitals Time Taken: 10:50 AM, Height: 63 in, Weight: 155 lbs, BMI: 27.5, Temperature: 98.7 F, Pulse: 82 bpm, Blood Pressure: 131/65 mmHg. Eyes Nonicteric. Reactive to light. Ears, Nose, Mouth, and Throat Lips, teeth, and gums WNL.Marland Kitchen Moist mucosa without lesions. Neck supple and nontender. No palpable supraclavicular or cervical adenopathy. Normal sized without goiter. Respiratory WNL. No retractions.. Cardiovascular Pedal Pulses WNL. No clubbing, cyanosis or edema. Lymphatic No adneopathy. No adenopathy. No adenopathy. Musculoskeletal Adexa without tenderness or enlargement.. Digits and nails w/o clubbing, cyanosis, infection, petechiae, ischemia, or inflammatory conditions.Marland Kitchen Psychiatric Judgement and insight Intact.. No evidence of depression, anxiety, or agitation.. General Notes: the wound in the left lower extremity is looking much cleaner and there is healthy granulation tissue except for a few areas where there is debris down to the  subcutaneous tissue and this was sharply removed with a toothed forcep. Bleeding was controlled with pressure. Integumentary (Hair, Skin) No suspicious lesions. No crepitus or fluctuance. No peri-wound warmth or erythema. No masses.. Wound #1 status is Open. Original cause of wound was Other Lesion. The wound is located on the Left,Lateral Lower Leg. The wound measures 1.1cm length x 1.3cm width x 0.1cm depth; 1.123cm^2 area and 0.112cm^3 volume. The wound is limited to  skin breakdown. There is no tunneling or undermining noted. There is a medium amount of serosanguineous drainage noted. The wound margin is flat and intact. There is medium (34-66%) red granulation within the wound bed. There is a small (1-33%) amount of [name], [name]. ([id number]) necrotic tissue within the wound bed including Adherent Slough. The periwound skin appearance exhibited: Erythema. The periwound skin appearance did not exhibit: Callus, Crepitus, Excoriation, Fluctuance, Friable, Induration, Localized Edema, Rash, Scarring, Dry/Scaly, Maceration, Moist, Atrophie Blanche, Cyanosis, Ecchymosis, Hemosiderin Staining, Mottled, Pallor, Rubor. The surrounding wound skin color is noted with erythema which is circumferential. Periwound temperature was noted as No Abnormality. The periwound has tenderness on palpation. Assessment Active Problems ICD-10 E11.622 - Type 2 diabetes mellitus with other skin ulcer T81.31XA - Disruption of external operation (surgical) wound, not elsewhere classified, initial encounter L97.222 - Non-pressure chronic ulcer of left calf with fat layer exposed C44.729 - Squamous cell carcinoma of skin of left lower limb, including hip the wound is doing well with Prisma AG and have asked her to continue this on alternate days. She will continue elevation and exercise as much as possible and see me back next week. Procedures Wound #1 Wound #1 is an Atypical located on the Left,Lateral Lower Leg . There was a Skin/Subcutaneous Tissue Debridement [id number]) debridement with total area of 1.43 sq cm performed by Christin Fudge, MD. with the following instrument(s): Forceps to remove Viable and Non-Viable tissue/material including Fibrin/Slough and Subcutaneous after achieving pain control using Lidocaine 4% Topical Solution. A time out was conducted prior to the start of the procedure. A Minimum amount of bleeding was controlled with N/A. The procedure was  tolerated well with a pain level of 0 throughout and a pain level of 0 following the procedure. Post Debridement Measurements: 1.1cm length x 1.4cm width x 0.1cm depth; 0.121cm^3 volume. Post procedure Diagnosis Wound #1: Same as Pre-Procedure Plan [name], [name] ([id number]) Wound Cleansing: Wound #1 Left,Lateral Lower Leg: Cleanse wound with mild soap and water May Shower, gently pat wound dry prior to applying new dressing. Primary Wound Dressing: Wound #1 Left,Lateral Lower Leg: Prisma Ag Secondary Dressing: Wound #1 Left,Lateral Lower Leg: Boardered Foam Dressing Dressing Change Frequency: Wound #1 Left,Lateral Lower Leg: Change dressing every other day. Follow-up Appointments: Wound #1 Left,Lateral Lower Leg: Return Appointment in 1 week. Edema Control: Wound #1 Left,Lateral Lower Leg: Other: - elevate leg as much as possible the wound is doing well with Prisma AG and have asked her to continue this on alternate days. She will continue elevation and exercise as much as possible and see me back next week. Electronic Signature(s) Signed: 06/11/2015 4:48:55 PM By: Christin Fudge MD, FACS Previous Signature: 06/11/2015 11:19:04 AM Version By: Christin Fudge MD, FACS Entered By: Christin Fudge on 06/11/2015 16:48:55 [name] ([id number]) -------------------------------------------------------------------------------- SuperBill Details Patient Name: [name] Date of Service: 06/11/2015 Medical Record Number: [id number] Patient Account Number: [id number] Date of Birth/Sex: Sep 16, 1945 (70 y.o. Female) Treating RN: Macarthur Critchley Primary Care Physician: Tracie Harrier Other Clinician: Referring Physician: Tracie Harrier Treating  Physician/Extender: Frann Rider in Treatment: 2 Diagnosis Coding ICD-10 Codes Code Description E11.622 Type 2 diabetes mellitus with other skin ulcer Disruption of external operation (surgical) wound, not elsewhere  classified, initial T81.31XA encounter L97.222 Non-pressure chronic ulcer of left calf with fat layer exposed C44.729 Squamous cell carcinoma of skin of left lower limb, including hip Facility Procedures CPT4: Description Modifier Quantity Code [id number] - DEB SUBQ TISSUE 20 SQ CM/< 1 ICD-10 Description Diagnosis E11.622 Type 2 diabetes mellitus with other skin ulcer T81.31XA Disruption of external operation (surgical) wound, not elsewhere  classified, initial encounter L97.222 Non-pressure chronic ulcer of left calf with fat layer exposed C44.729 Squamous cell carcinoma of skin of left lower limb, including hip Physician Procedures CPT4: Description Modifier Quantity Code [id number] - WC PHYS SUBQ TISS 20 SQ CM 1 ICD-10 Description Diagnosis E11.622 Type 2 diabetes mellitus with other skin ulcer T81.31XA Disruption of external operation (surgical) wound, not elsewhere  classified, initial encounter L97.222 Non-pressure chronic ulcer of left calf with fat layer exposed C44.729 Squamous cell carcinoma of skin of left lower limb, including hip Electronic Signature(s) Signed: 06/11/2015 11:19:17 AM By: Christin Fudge MD, FACS [name] ([id number]) Entered By: Christin Fudge on 06/11/2015 11:19:17

## 2015-06-18 ENCOUNTER — Encounter: Payer: Medicare Other | Attending: Surgery | Admitting: Surgery

## 2015-06-18 DIAGNOSIS — T8131XA Disruption of external operation (surgical) wound, not elsewhere classified, initial encounter: Secondary | ICD-10-CM | POA: Diagnosis not present

## 2015-06-18 DIAGNOSIS — E039 Hypothyroidism, unspecified: Secondary | ICD-10-CM | POA: Insufficient documentation

## 2015-06-18 DIAGNOSIS — E785 Hyperlipidemia, unspecified: Secondary | ICD-10-CM | POA: Diagnosis not present

## 2015-06-18 DIAGNOSIS — C4492 Squamous cell carcinoma of skin, unspecified: Secondary | ICD-10-CM | POA: Diagnosis not present

## 2015-06-18 DIAGNOSIS — L97222 Non-pressure chronic ulcer of left calf with fat layer exposed: Secondary | ICD-10-CM | POA: Diagnosis not present

## 2015-06-18 DIAGNOSIS — Y839 Surgical procedure, unspecified as the cause of abnormal reaction of the patient, or of later complication, without mention of misadventure at the time of the procedure: Secondary | ICD-10-CM | POA: Diagnosis not present

## 2015-06-18 DIAGNOSIS — Z87891 Personal history of nicotine dependence: Secondary | ICD-10-CM | POA: Insufficient documentation

## 2015-06-18 DIAGNOSIS — E11622 Type 2 diabetes mellitus with other skin ulcer: Secondary | ICD-10-CM | POA: Diagnosis not present

## 2015-06-18 NOTE — Progress Notes (Addendum)
MECHEL, PATRAS ([id number]) Visit Report for 06/18/2015 Chief Complaint Document Details Patient Name: [name], [name]. Date of Service: 06/18/2015 10:00 AM Medical Record Number: [id number] Patient Account Number: [id number] Date of Birth/Sex: 11-Mar-1946 (70 y.o. Female) Treating RN: Macarthur Critchley Primary Care Physician: Tracie Harrier Other Clinician: Referring Physician: Tracie Harrier Treating Physician/Extender: Frann Rider in Treatment: 3 Information Obtained from: Patient Chief Complaint Patient seen for complaints of Non-Healing Wound. the patient had a left lower extremity skin lesion excised in September 2016. She has had a nonhealing wound since Electronic Signature(s) Signed: 06/18/2015 10:32:56 AM By: Christin Fudge MD, FACS Entered By: Christin Fudge on 06/18/2015 10:32:56 [name] ([id number]) -------------------------------------------------------------------------------- HPI Details Patient Name: [name] Date of Service: 06/18/2015 10:00 AM Medical Record Number: [id number] Patient Account Number: [id number] Date of Birth/Sex: 08/02/1945 (70 y.o. Female) Treating RN: Macarthur Critchley Primary Care Physician: Tracie Harrier Other Clinician: Referring Physician: Tracie Harrier Treating Physician/Extender: Frann Rider in Treatment: 3 History of Present Illness Location: left lower extremity laterally Quality: Patient reports experiencing a dull pain to affected area(s). Severity: Patient states wound (s) are getting better. Duration: Patient has had the wound for > 3 months prior to seeking treatment at the wound center Timing: Pain in wound is Intermittent (comes and goes Context: The wound occurred when the patient had a squamous cell carcinoma excised and the sutures were removed and the wound opened out Modifying Factors: Other treatment(s) tried include:antibiotics for 2 separate courses Associated Signs and  Symptoms: Patient reports having difficulty standing for long periods. HPI Description: 70 year old diabetic whose last hemoglobin A1c was 7.9 in August 2016, has had a skin cancer removed from her left lower extremity in September 2016. The wound got infected was given Keflex and had it for about a month. She continues to have an open ulcerated area on this leg and most recently was seen by her PCP and given doxycycline for 2 weeks. She is a ex-smoker who gave up smoking in 2000. Past medical history significant for anemia, hyperlipidemia, hypothyroidism and as noted diabetes mellitus. She is also status post hysterectomy, appendectomy and carpal tunnel release. the patient has been using some abrasive methods of cleaning the wound with hydrogen peroxide and has been leaving the wound open as per instructions of the dermatologist. She may also have had a skin substitute applied sometime in the past. 06/04/2015 -- Notes received from dermatology from Dr. Elveria Rising office -- previous history of basal cell carcinoma treated surgically and also has history of squamous cell carcinoma and actinic keratosis. Today 68 pages of notes were reviewed by me. Most recent biopsy was for a squamous cell carcinoma on the left lateral distal calf and was seen on September 8 for this. Pathology showed clear margins. Electronic Signature(s) Signed: 06/18/2015 10:33:01 AM By: Christin Fudge MD, FACS Entered By: Christin Fudge on 06/18/2015 10:33:01 [name] ([id number]) -------------------------------------------------------------------------------- Physical Exam Details Patient Name: [name] Date of Service: 06/18/2015 10:00 AM Medical Record Number: [id number] Patient Account Number: [id number] Date of Birth/Sex: [birth year] (70 y.o. Female) Treating RN: Macarthur Critchley Primary Care Physician: Tracie Harrier Other Clinician: Referring Physician: Tracie Harrier Treating Physician/Extender:  Frann Rider in Treatment: 3 Constitutional . Pulse regular. Respirations normal and unlabored. Afebrile. . Eyes Nonicteric. Reactive to light. Ears, Nose, Mouth, and Throat Lips, teeth, and gums WNL.Marland Kitchen Moist mucosa without lesions. Neck supple and nontender. No palpable supraclavicular or cervical adenopathy. Normal sized without goiter. Respiratory WNL. No retractions.. Cardiovascular Pedal  Pulses WNL. No clubbing, cyanosis or edema. Lymphatic No adneopathy. No adenopathy. No adenopathy. Musculoskeletal Adexa without tenderness or enlargement.. Digits and nails w/o clubbing, cyanosis, infection, petechiae, ischemia, or inflammatory conditions.. Integumentary (Hair, Skin) No suspicious lesions. No crepitus or fluctuance. No peri-wound warmth or erythema. No masses.Marland Kitchen Psychiatric Judgement and insight Intact.. No evidence of depression, anxiety, or agitation.. Notes the wound is looking excellent and there is some hyper granulation tissue which on washings with saline gauze had a bit of bleeding which was controlled with Silver nitrate. Overall dimensions of the wound much smaller and her progress has been excellent Electronic Signature(s) Signed: 06/18/2015 10:34:04 AM By: Christin Fudge MD, FACS Entered By: Christin Fudge on 06/18/2015 10:34:03 [name] ([id number]) -------------------------------------------------------------------------------- Physician Orders Details Patient Name: [name] Date of Service: 06/18/2015 10:00 AM Medical Record Number: [id number] Patient Account Number: [id number] Date of Birth/Sex: [birth year] (70 y.o. Female) Treating RN: Macarthur Critchley Primary Care Physician: Tracie Harrier Other Clinician: Referring Physician: Tracie Harrier Treating Physician/Extender: Frann Rider in Treatment: 3 Verbal / Phone Orders: Yes Clinician: Macarthur Critchley Read Back and Verified: Yes Diagnosis Coding Wound Cleansing Wound  #1 Left,Lateral Lower Leg o Cleanse wound with mild soap and water o May Shower, gently pat wound dry prior to applying new dressing. Primary Wound Dressing Wound #1 Left,Lateral Lower Leg o Other: - cutimed foam Secondary Dressing Wound #1 Left,Lateral Lower Leg o Boardered Foam Dressing Dressing Change Frequency Wound #1 Left,Lateral Lower Leg o Change dressing every other day. Follow-up Appointments Wound #1 Left,Lateral Lower Leg o Return Appointment in 1 week. Edema Control Wound #1 Left,Lateral Lower Leg o Other: - elevate leg as much as possible Electronic Signature(s) Signed: 06/18/2015 3:39:32 PM By: Ardean Larsen Signed: 06/18/2015 3:57:47 PM By: Christin Fudge MD, FACS Entered By: Rebecca Eaton RN, [place] on 06/18/2015 10:17:28 [name] ([id number]) -------------------------------------------------------------------------------- Problem List Details Patient Name: [name] Date of Service: 06/18/2015 10:00 AM Medical Record Number: [id number] Patient Account Number: [id number] Date of Birth/Sex: 16-Nov-1945 (70 y.o. 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 Female) Treating RN: Macarthur Critchley Primary Care Physician: Tracie Harrier Other Clinician: Referring Physician: Tracie Harrier Treating Physician/Extender: Frann Rider in Treatment: 3 Subjective Chief Complaint Information obtained from Patient Patient seen for complaints of Non-Healing Wound. the patient had a left lower extremity skin lesion excised in September 2016. She has had a nonhealing wound since History of Present Illness (HPI) The following HPI elements were documented for the patient's wound: Location: left lower extremity laterally Quality: Patient reports experiencing a dull pain to affected area(s). Severity: Patient states wound (s) are getting better. Duration: Patient has had the wound for > 3 months prior to seeking treatment  at the wound center Timing: Pain in wound is Intermittent (comes and goes Context: The wound occurred when the patient had a squamous cell carcinoma excised and the sutures were removed and the wound opened out Modifying Factors: Other treatment(s) tried include:antibiotics for 2 separate courses Associated Signs and Symptoms: Patient reports having difficulty standing for long periods. 70 year old diabetic whose last hemoglobin A1c was 7.9 in August 2016, has had a skin cancer removed from her left lower extremity in September 2016. The wound got infected was given Keflex and had it for about a month. She continues to have an open ulcerated area on this leg and most recently was seen by her PCP and given doxycycline for 2 weeks. She is a ex-smoker who gave up smoking in 2000. Past medical history significant for anemia, hyperlipidemia,  hypothyroidism and as noted diabetes mellitus. She is also status post hysterectomy, appendectomy and carpal tunnel release. the patient has been using some abrasive methods of cleaning the wound with hydrogen peroxide and has been leaving the wound open as per instructions of the dermatologist. She may also have had a skin substitute applied sometime in the past. 06/04/2015 -- Notes received from dermatology from Dr. Elveria Rising office -- previous history of basal cell carcinoma treated surgically and also has history of squamous cell carcinoma and actinic keratosis. Today 68 pages of notes were reviewed by me. Most recent biopsy was for a squamous cell carcinoma on the left lateral distal calf and was seen on September 8 for this. Pathology showed clear margins. [name], [name] ([id number]) Objective Constitutional Pulse regular. Respirations normal and unlabored. Afebrile. Vitals Time Taken: 10:00 AM, Height: 63 in, Weight: 155 lbs, BMI: 27.5, Temperature: 97.9 F, Pulse: 83 bpm, Blood Pressure: 148/67 mmHg. Eyes Nonicteric. Reactive to light. Ears, Nose, Mouth, and Throat Lips, teeth, and gums WNL.Marland Kitchen Moist mucosa without lesions. Neck supple and nontender. No palpable supraclavicular or cervical adenopathy. Normal sized without goiter. Respiratory WNL. No retractions.. Cardiovascular Pedal Pulses WNL. No clubbing, cyanosis or edema. Lymphatic No adneopathy. No adenopathy. No adenopathy. Musculoskeletal Adexa without tenderness or enlargement.. Digits and nails w/o clubbing, cyanosis, infection, petechiae, ischemia, or inflammatory conditions.Marland Kitchen Psychiatric Judgement and insight Intact.. No evidence of depression, anxiety, or agitation.. General Notes: the wound is looking excellent and there is some hyper granulation tissue which on washings with saline gauze had a bit of bleeding which was controlled with Silver nitrate. Overall dimensions of the wound much smaller and her  progress has been excellent Integumentary (Hair, Skin) No suspicious lesions. No crepitus or fluctuance. No peri-wound warmth or erythema. No masses.. Wound #1 status is Open. Original cause of wound was Other Lesion. The wound is located on the Left,Lateral Lower Leg. The wound measures 0.6cm length x 0.9cm width x 0.1cm depth; 0.424cm^2 area and 0.042cm^3 volume. The wound is limited to skin breakdown. There is no tunneling or undermining noted. There is a medium amount of serosanguineous drainage noted. The wound margin is flat and intact. There is large (67-100%) red granulation within the wound bed. There is a small (1-33%) amount of necrotic tissue within the wound bed including Adherent Slough. The periwound skin appearance exhibited: Erythema. The periwound skin appearance did not exhibit: Callus, Crepitus, Excoriation, Fluctuance, [place], [name] S. ([id number]) Friable, Induration, Localized Edema, Rash, Scarring, Dry/Scaly, Maceration, Moist, Atrophie Blanche, Cyanosis, Ecchymosis, Hemosiderin Staining, Mottled, Pallor, Rubor. The surrounding wound skin color is noted with erythema which is circumferential. Periwound temperature was noted as No Abnormality. The periwound has tenderness  on palpation. Assessment Active Problems ICD-10 E11.622 - Type 2 diabetes mellitus with other skin ulcer T81.31XA - Disruption of external operation (surgical) wound, not elsewhere classified, initial encounter L97.222 - Non-pressure chronic ulcer of left calf with fat layer exposed C44.729 - Squamous cell carcinoma of skin of left lower limb, including hip Plan Wound Cleansing: Wound #1 Left,Lateral Lower Leg: Cleanse wound with mild soap and water May Shower, gently pat wound dry prior to applying new dressing. Primary Wound Dressing: Wound #1 Left,Lateral Lower Leg: Other: - cutimed foam Secondary Dressing: Wound #1 Left,Lateral Lower Leg: Boardered Foam Dressing Dressing Change  Frequency: Wound #1 Left,Lateral Lower Leg: Change dressing every other day. Follow-up Appointments: Wound #1 Left,Lateral Lower Leg: Return Appointment in 1 week. Edema Control: Wound #1 Left,Lateral Lower Leg: Other: - elevate leg as much as possible [name], [name]. ([id number]) I have recommended Cutimed Sorbact foam to be applied with the border and changes every other day. Come back and see me next week Electronic Signature(s) Signed: 06/18/2015 4:01:13 PM By: Christin Fudge MD, FACS Previous Signature: 06/18/2015 10:34:42 AM Version By: Christin Fudge MD, FACS Entered By: Christin Fudge on 06/18/2015 16:01:12 [name] ([id number]) -------------------------------------------------------------------------------- SuperBill Details Patient Name: [name] Date of Service: 06/18/2015 Medical Record Number: [id number] Patient Account Number: [id number] Date of Birth/Sex: [birth year] (70 y.o. Female) Treating RN: Macarthur Critchley Primary Care Physician: Tracie Harrier Other Clinician: Referring Physician: Tracie Harrier Treating Physician/Extender: Frann Rider in Treatment: 3 Diagnosis Coding ICD-10 Codes Code Description E11.622 Type 2 diabetes mellitus with other skin ulcer Disruption of external operation (surgical) wound, not elsewhere classified, initial T81.31XA encounter L97.222 Non-pressure chronic ulcer of left calf with fat layer exposed C44.729 Squamous cell carcinoma of skin of left lower limb, including hip Facility Procedures CPT4 Code: [id number] Description: 99213 - WOUND CARE VISIT-LEV 3 EST PT Modifier: Quantity: 1 Physician Procedures CPT4: Description Modifier Quantity Code [id number] - WC PHYS LEVEL 3 - EST PT 1 ICD-10 Description Diagnosis E11.622 Type 2 diabetes mellitus with other skin ulcer T81.31XA Disruption of external operation (surgical) wound, not elsewhere classified,  initial encounter L97.222 Non-pressure chronic  ulcer of left calf with fat layer exposed C44.729 Squamous cell carcinoma of skin of left lower limb, including hip Electronic Signature(s) Signed: 06/18/2015 3:39:32 PM By: Ardean Larsen Signed: 06/18/2015 3:57:47 PM By: Christin Fudge MD, FACS Previous Signature: 06/18/2015 10:34:56 AM Version By: Christin Fudge MD, FACS Entered By: Rebecca Eaton RN, Roslynn Amble on 06/18/2015 10:38:47

## 2015-06-18 NOTE — Progress Notes (Signed)
SHAQUELL, ROEHR ([id number]) Visit Report for 06/18/2015 Arrival Information Details Patient Name: [name], [name]. Date of Service: 06/18/2015 10:00 AM Medical Record Number: [id number] Patient Account Number: [id number] Date of Birth/Sex: 11-Mar-1946 (69 y.o. Female) Treating RN: Macarthur Critchley Primary Care Physician: Tracie Harrier Other Clinician: Referring Physician: Tracie Harrier Treating Physician/Extender: Frann Rider in Treatment: 3 Visit Information History Since Last Visit All ordered tests and consults were completed: No Patient Arrived: Ambulatory Added or deleted any medications: No Arrival Time: 09:58 Any new allergies or adverse reactions: No Accompanied By: husband Had a fall or experienced change in No Transfer Assistance: None activities of daily living that may affect Patient Identification Verified: Yes risk of falls: Secondary Verification Process Yes Signs or symptoms of abuse/neglect since last No Completed: visito Patient Has Alerts: Yes Hospitalized since last visit: No Patient Alerts: Patient on Blood Has Dressing in Place as Prescribed: Yes Thinner Pain Present Now: No 05/28/15 ABI .99 Right 05/28/15 ABI .99 Left Electronic Signature(s) Signed: 06/18/2015 3:39:32 PM By: Rebecca Eaton RN, Sendra Entered By: Rebecca Eaton RN, Sendra on 06/18/2015 09:58:36 [name], [name] ([id number]) -------------------------------------------------------------------------------- Clinic Level of Care Assessment Details Patient Name: [name] Date of Service: 06/18/2015 10:00 AM Medical Record Number: [id number] Patient Account Number: [id number] Date of Birth/Sex: 1945-07-23 (69 y.o. Female) Treating RN: Macarthur Critchley Primary Care Physician: Tracie Harrier Other Clinician: Referring Physician: Tracie Harrier Treating Physician/Extender: Frann Rider in Treatment: 3 Clinic Level of Care Assessment Items TOOL 4 Quantity Score X  - Use when only an EandM is performed on FOLLOW-UP visit 1 0 ASSESSMENTS - Nursing Assessment / Reassessment X - Reassessment of Co-morbidities (includes updates in patient status) 1 10 X - Reassessment of Adherence to Treatment Plan 1 5 ASSESSMENTS - Wound and Skin Assessment / Reassessment X - Simple Wound Assessment / Reassessment - one wound 1 5 []  - Complex Wound Assessment / Reassessment - multiple wounds 0 []  - Dermatologic / Skin Assessment (not related to wound area) 0 ASSESSMENTS - Focused Assessment X - Circumferential Edema Measurements - multi extremities 1 5 []  - Nutritional Assessment / Counseling / Intervention 0 X - Lower Extremity Assessment (monofilament, tuning fork, pulses) 1 5 []  - Peripheral Arterial Disease Assessment (using hand held doppler) 0 ASSESSMENTS - Ostomy and/or Continence Assessment and Care []  - Incontinence Assessment and Management 0 []  - Ostomy Care Assessment and Management (repouching, etc.) 0 PROCESS - Coordination of Care X - Simple Patient / Family Education for ongoing care 1 15 []  - Complex (extensive) Patient / Family Education for ongoing care 0 X - Staff obtains Programmer, systems, Records, Test Results / Process Orders 1 10 []  - Staff telephones HHA, Nursing Homes / Clarify orders / etc 0 []  - Routine Transfer to another Facility (non-emergent condition) 0 WERONIKA, ERTL ([id number]) []  - Routine Hospital Admission (non-emergent condition) 0 []  - New Admissions / Biomedical engineer / Ordering NPWT, Apligraf, etc. 0 []  - Emergency Hospital Admission (emergent condition) 0 X - Simple Discharge Coordination 1 10 []  - Complex (extensive) Discharge Coordination 0 PROCESS - Special Needs []  - Pediatric / Minor Patient Management 0 []  - Isolation Patient Management 0 []  - [name] / Language / Visual special needs 0 []  - Assessment of Community assistance (transportation, D/C planning, etc.) 0 []  - Additional assistance / Altered mentation  0 []  - Support Surface(s) Assessment (bed, cushion, seat, etc.) 0 INTERVENTIONS - Wound Cleansing / Measurement X - Simple Wound Cleansing - one wound 1 5 []  -  Complex Wound Cleansing - multiple wounds 0 X - Wound Imaging (photographs - any number of wounds) 1 5 []  - Wound Tracing (instead of photographs) 0 X - Simple Wound Measurement - one wound 1 5 []  - Complex Wound Measurement - multiple wounds 0 INTERVENTIONS - Wound Dressings X - Small Wound Dressing one or multiple wounds 1 10 []  - Medium Wound Dressing one or multiple wounds 0 []  - Large Wound Dressing one or multiple wounds 0 []  - Application of Medications - topical 0 []  - Application of Medications - injection 0 INTERVENTIONS - Miscellaneous []  - External ear exam 0 Golla, [name] ([id number]) []  - Specimen Collection (cultures, biopsies, blood, body fluids, etc.) 0 []  - Specimen(s) / Culture(s) sent or taken to Lab for analysis 0 []  - Patient Transfer (multiple staff / Harrel Lemon Lift / Similar devices) 0 []  - Simple Staple / Suture removal (25 or less) 0 []  - Complex Staple / Suture removal (26 or more) 0 []  - Hypo / Hyperglycemic Management (close monitor of Blood Glucose) 0 []  - Ankle / Brachial Index (ABI) - do not check if billed separately 0 X - Vital Signs 1 5 Has the patient been seen at the hospital within the last three years: Yes Total Score: 95 Level Of Care: New/Established - Level 3 Electronic Signature(s) Signed: 06/18/2015 3:39:32 PM By: Rebecca Eaton, RN, Sendra Entered By: Rebecca Eaton RN, Sendra on 06/18/2015 10:38:38 [name] ([id number]) -------------------------------------------------------------------------------- Encounter Discharge Information Details Patient Name: [name] Date of Service: 06/18/2015 10:00 AM Medical Record Number: [id number] Patient Account Number: [id number] Date of Birth/Sex: 09-11-1945 (69 y.o. Female) Treating RN: Macarthur Critchley Primary Care Physician: Tracie Harrier Other Clinician: Referring Physician: Tracie Harrier Treating Physician/Extender: Frann Rider in Treatment: 3 Encounter Discharge Information Items Discharge Pain Level: 0 Discharge Condition: Stable Ambulatory Status: Ambulatory Discharge Destination: Home Transportation: Private Auto Accompanied By: husband Schedule Follow-up Appointment: Yes Medication Reconciliation completed and provided to Patient/Care Yes Tiani Stanbery: Provided on Clinical Summary of Care: 06/18/2015 Form Type Recipient Paper Patient ER Electronic Signature(s) Signed: 06/18/2015 3:39:32 PM By: Rebecca Eaton RN, Sendra Previous Signature: 06/18/2015 10:21:29 AM Version By: Ruthine Dose Entered By: Rebecca Eaton RN, Sendra on 06/18/2015 10:25:17 [name] ([id number]) -------------------------------------------------------------------------------- Lower Extremity Assessment Details Patient Name: [name] Date of Service: 06/18/2015 10:00 AM Medical Record Number: [id number] Patient Account Number: [id number] Date of Birth/Sex: [birth year] (69 y.o. Female) Treating RN: Macarthur Critchley Primary Care Physician: Tracie Harrier Other Clinician: Referring Physician: Tracie Harrier Treating Physician/Extender: Frann Rider in Treatment: 3 Edema Assessment Assessed: [Left: No] [Right: No] Edema: [Left: N] [Right: o] Calf Left: Right: Point of Measurement: 28 cm From Medial Instep 34 cm cm Ankle Left: Right: Point of Measurement: 10 cm From Medial Instep 20.8 cm cm Vascular Assessment Pulses: Posterior Tibial Dorsalis Pedis Palpable: [Left:Yes] Extremity colors, hair growth, and conditions: Extremity Color: [Left:Normal] Hair Growth on Extremity: [Left:Yes] Temperature of Extremity: [Left:Warm] Capillary Refill: [Left:< 3 seconds] Toe Nail Assessment Left: Right: Thick: No Discolored: No Deformed: No Improper Length and Hygiene: No Electronic  Signature(s) Signed: 06/18/2015 3:39:32 PM By: Rebecca Eaton, RN, Sendra Entered By: Rebecca Eaton RN, Sendra on 06/18/2015 10:05:53 [name] ([id number]) -------------------------------------------------------------------------------- Pain Assessment Details Patient Name: [name] Date of Service: 06/18/2015 10:00 AM Medical Record Number: [id number] Patient Account Number: [id number] Date of Birth/Sex: 03-30-1946 (69 y.o. Female) Treating RN: Macarthur Critchley Primary Care Physician: Tracie Harrier Other Clinician: Referring Physician: Tracie Harrier Treating Physician/Extender: Frann Rider in Treatment:  3 Active Problems Location of Pain Severity and Description of Pain Patient Has Paino No Site Locations Rate the pain. Current Pain Level: 0 Pain Management and Medication Current Pain Management: Electronic Signature(s) Signed: 06/18/2015 3:39:32 PM By: Rebecca Eaton, RN, Sendra Entered By: Rebecca Eaton RN, Sendra on 06/18/2015 09:58:44 [name] ([id number]) -------------------------------------------------------------------------------- Patient/Caregiver Education Details Patient Name: [name] Date of Service: 06/18/2015 10:00 AM Medical Record Number: [id number] Patient Account Number: [id number] Date of Birth/Gender: [birth year] (69 y.o. Female) Treating RN: Macarthur Critchley Primary Care Physician: Tracie Harrier Other Clinician: Referring Physician: Tracie Harrier Treating Physician/Extender: Frann Rider in Treatment: 3 Education Assessment Education Provided To: Patient and Caregiver Education Topics Provided Elevated Blood Sugar/ Impact on Healing: Handouts: Elevated Blood Sugars: How Do They Affect Wound Healing Methods: Explain/Verbal Responses: State content correctly Wound/Skin Impairment: Handouts: Caring for Your Ulcer, Skin Care Do's and Dont's Methods: Explain/Verbal Responses: State content correctly Electronic  Signature(s) Signed: 06/18/2015 3:39:32 PM By: Rebecca Eaton, RN, Sendra Entered By: Rebecca Eaton RN, Sendra on 06/18/2015 10:10:12 [name] ([id number]) -------------------------------------------------------------------------------- Wound Assessment Details Patient Name: [name] Date of Service: 06/18/2015 10:00 AM Medical Record Number: [id number] Patient Account Number: [id number] Date of Birth/Sex: 1945-08-01 (69 y.o. Female) Treating RN: Macarthur Critchley Primary Care Physician: Tracie Harrier Other Clinician: Referring Physician: Tracie Harrier Treating Physician/Extender: Frann Rider in Treatment: 3 Wound Status Wound Number: 1 Primary Etiology: Atypical Wound Location: Left Lower Leg - Lateral Wound Status: Open Wounding Event: Other Lesion Comorbid History: Cataracts, Type II Diabetes Date Acquired: 12/21/2014 Weeks Of Treatment: 3 Clustered Wound: No Photos Wound Measurements Length: (cm) 0.6 Width: (cm) 0.9 Depth: (cm) 0.1 Area: (cm) 0.424 Volume: (cm) 0.042 % Reduction in Area: 89.2% % Reduction in Volume: 89.3% Epithelialization: Medium (34-66%) Tunneling: No Undermining: No Wound Description Full Thickness Without Exposed Foul Odor A Classification: Support Structures Diabetic Severity Grade 1 (Wagner): Wound Margin: Flat and Intact Exudate Amount: Medium Exudate Type: Serosanguineous Exudate Color: red, brown fter Cleansing: No Wound Bed Granulation Amount: Large (67-100%) Exposed Structure Granulation Quality: Red, Hyper-granulation Fascia Exposed: No [name], [name] ([id number]) Necrotic Amount: Small (1-33%) Fat Layer Exposed: No Necrotic Quality: Adherent Slough Tendon Exposed: No Muscle Exposed: No Joint Exposed: No Bone Exposed: No Limited to Skin Breakdown Periwound Skin Texture Texture Color No Abnormalities Noted: No No Abnormalities Noted: No Callus: No Atrophie Blanche: No Crepitus: No Cyanosis:  No Excoriation: No Ecchymosis: No Fluctuance: No Erythema: Yes Friable: No Erythema Location: Circumferential Induration: No Hemosiderin Staining: No Localized Edema: No Mottled: No Rash: No Pallor: No Scarring: No Rubor: No Moisture Temperature / Pain No Abnormalities Noted: No Temperature: No Abnormality Dry / Scaly: No Tenderness on Palpation: Yes Maceration: No Moist: No Wound Preparation Ulcer Cleansing: Rinsed/Irrigated with Saline Topical Anesthetic Applied: Other: lidocaine 4%, Treatment Notes Wound #1 (Left, Lateral Lower Leg) 1. Cleansed with: Clean wound with Normal Saline 2. Anesthetic Topical Lidocaine 4% cream to wound bed prior to debridement 4. Dressing Applied: Other dressing (specify in notes) 5. Secondary Dressing Applied Bordered Foam Dressing Notes cutmed foam Electronic Signature(s) Signed: 06/18/2015 3:39:32 PM By: Rebecca Eaton, RN, Sendra Entered By: Rebecca Eaton RN, Sendra on 06/18/2015 15:37:01 [name], [name] ([id number]) [name] ([id number]) -------------------------------------------------------------------------------- Vitals Details Patient Name: [name] Date of Service: 06/18/2015 10:00 AM Medical Record Number: [id number] Patient Account Number: [id number] Date of Birth/Sex: 1945-09-26 (69 y.o. Female) Treating RN: Macarthur Critchley Primary Care Physician: Tracie Harrier Other Clinician: Referring Physician: Tracie Harrier Treating Physician/Extender: Frann Rider in  Treatment: 3 Vital Signs Time Taken: 10:00 Temperature (F): 97.9 Height (in): 63 Pulse (bpm): 83 Weight (lbs): 155 Blood Pressure (mmHg): 148/67 Body Mass Index (BMI): 27.5 Reference Range: 80 - 120 mg / dl Electronic Signature(s) Signed: 06/18/2015 3:39:32 PM By: Rebecca Eaton RN, Sendra Entered By: Rebecca Eaton RN, Sendra on 06/18/2015 10:26:05

## 2015-06-20 ENCOUNTER — Other Ambulatory Visit: Payer: Self-pay | Admitting: Internal Medicine

## 2015-06-20 DIAGNOSIS — Z1231 Encounter for screening mammogram for malignant neoplasm of breast: Secondary | ICD-10-CM

## 2015-06-25 ENCOUNTER — Encounter: Payer: Medicare Other | Admitting: Surgery

## 2015-06-25 DIAGNOSIS — E11622 Type 2 diabetes mellitus with other skin ulcer: Secondary | ICD-10-CM | POA: Diagnosis not present

## 2015-06-25 NOTE — Progress Notes (Signed)
MAREN, MANEY ([id number]) Visit Report for 06/25/2015 Chief Complaint Document Details Patient Name: [name], [name] 06/25/2015 10:00 Date of Service: AM Medical Record [id number] Number: Patient Account Number: [id number] Jun 12, 1945 (70 y.o. Treating RN: Macarthur Critchley Date of Birth/Sex: Female) Other Clinician: Primary Care Physician: Tracie Harrier Treating Elmarie Devlin Referring Physician: Tracie Harrier Physician/Extender: Suella Grove in Treatment: 4 Information Obtained from: Patient Chief Complaint Patient seen for complaints of Non-Healing Wound. the patient had a left lower extremity skin lesion excised in September 2016. She has had a nonhealing wound since Electronic Signature(s) Signed: 06/25/2015 10:52:38 AM By: Christin Fudge MD, FACS Entered By: Christin Fudge on 06/25/2015 10:52:38 [name] ([id number]) -------------------------------------------------------------------------------- HPI Details Patient Name: [name], [name] 06/25/2015 10:00 Date of Service: AM Medical Record [id number] Number: Patient Account Number: [id number] September 13, 1945 (70 y.o. Treating RN: Macarthur Critchley Date of Birth/Sex: Female) Other Clinician: Primary Care Physician: Tracie Harrier Treating Roshan Roback Referring Physician: Tracie Harrier Physician/Extender: Suella Grove in Treatment: 4 History of Present Illness Location: left lower extremity laterally Quality: Patient reports experiencing a dull pain to affected area(s). Severity: Patient states wound (s) are getting better. Duration: Patient has had the wound for > 3 months prior to seeking treatment at the wound center Timing: Pain in wound is Intermittent (comes and goes Context: The wound occurred when the patient had a squamous cell carcinoma excised and the sutures were removed and the wound opened out Modifying Factors: Other treatment(s) tried include:antibiotics for 2 separate courses Associated  Signs and Symptoms: Patient reports having difficulty standing for long periods. HPI Description: 70 year old diabetic whose last hemoglobin A1c was 7.9 in August 2016, has had a skin cancer removed from her left lower extremity in September 2016. The wound got infected was given Keflex and had it for about a month. She continues to have an open ulcerated area on this leg and most recently was seen by her PCP and given doxycycline for 2 weeks. She is a ex-smoker who gave up smoking in 2000. Past medical history significant for anemia, hyperlipidemia, hypothyroidism and as noted diabetes mellitus. She is also status post hysterectomy, appendectomy and carpal tunnel release. the patient has been using some abrasive methods of cleaning the wound with hydrogen peroxide and has been leaving the wound open as per instructions of the dermatologist. She may also have had a skin substitute applied sometime in the past. 06/04/2015 -- Notes received from dermatology from Dr. Elveria Rising office -- previous history of basal cell carcinoma treated surgically and also has history of squamous cell carcinoma and actinic keratosis. Today 65 pages of notes were reviewed by me. Most recent biopsy was for a squamous cell carcinoma on the left lateral distal calf and was seen on September 8 for this. Pathology showed clear margins. Electronic Signature(s) Signed: 06/25/2015 10:54:19 AM By: Christin Fudge MD, FACS Entered By: Christin Fudge on 06/25/2015 10:54:18 [name] ([id number]) -------------------------------------------------------------------------------- Physical Exam Details Patient Name: [name], [name] 06/25/2015 10:00 Date of Service: AM Medical Record [id number] Number: Patient Account Number: [id number] Jul 21, 1945 (70 y.o. Treating RN: Macarthur Critchley Date of Birth/Sex: Female) Other Clinician: Primary Care Physician: Tracie Harrier Treating Faydra Korman Referring Physician: Tracie Harrier Physician/Extender: Weeks in Treatment: 4 Constitutional . Pulse regular. Respirations normal and unlabored. Afebrile. . Eyes Nonicteric. Reactive to light. Ears, Nose, Mouth, and Throat Lips, teeth, and gums WNL.Marland Kitchen Moist mucosa without lesions. Neck supple and nontender. No palpable supraclavicular or cervical adenopathy. Normal sized without goiter. Respiratory WNL. No retractions.. Cardiovascular Pedal  Pulses WNL. No clubbing, cyanosis or edema. Lymphatic No adneopathy. No adenopathy. No adenopathy. Musculoskeletal Adexa without tenderness or enlargement.. Digits and nails w/o clubbing, cyanosis, infection, petechiae, ischemia, or inflammatory conditions.. Integumentary (Hair, Skin) No suspicious lesions. No crepitus or fluctuance. No peri-wound warmth or erythema. No masses.Marland Kitchen Psychiatric Judgement and insight Intact.. No evidence of depression, anxiety, or agitation.. Notes the wound is completely healed and there is no open areas around her previous ulcer. Electronic Signature(s) Signed: 06/25/2015 10:55:10 AM By: Christin Fudge MD, FACS Entered By: Christin Fudge on 06/25/2015 10:55:09 [name] ([id number]) -------------------------------------------------------------------------------- Physician Orders Details Patient Name: [name], [name] 06/25/2015 10:00 Date of Service: AM Medical Record [id number] Number: Patient Account Number: [id number] [birth year] (70 y.o. Treating RN: Macarthur Critchley Date of Birth/Sex: Female) Other Clinician: Primary Care Physician: Tracie Harrier Treating Rip Hawes Referring Physician: Tracie Harrier Physician/Extender: Suella Grove in Treatment: 4 Verbal / Phone Orders: Yes Clinician: Macarthur Critchley Read Back and Verified: Yes Diagnosis Coding Primary Wound Dressing o Boardered Foam Dressing - keep area covered and protected Discharge From [facility] Services o Discharge from [place]  Signature(s) Signed: 06/25/2015 2:46:25 PM By: Ardean Larsen Signed: 06/25/2015 4:11:43 PM By: Christin Fudge MD, FACS Entered By: Rebecca Eaton RN, [place] on 06/25/2015 10:13:40 [name] ([id number]) -------------------------------------------------------------------------------- Problem List Details Patient Name: [name], [name] 06/25/2015 10:00 Date of Service: AM Medical Record [id number] Number: Patient Account Number: [id number] 12-Sep-1945 (70 y.o. 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 Treating RN: Macarthur Critchley Date of Birth/Sex: Female) Other Clinician: Primary Care Physician: Tracie Harrier Treating Janyce Ellinger Referring Physician: Tracie Harrier Physician/Extender: Suella Grove in Treatment: 4 Subjective Chief Complaint Information obtained from Patient Patient seen for complaints of  Non-Healing Wound. the patient had a left lower extremity skin lesion excised in September 2016. She has had a nonhealing wound since History of Present Illness (HPI) The following HPI elements were documented for the patient's wound: Location: left lower extremity laterally Quality: Patient reports experiencing a dull pain to affected area(s). Severity: Patient states wound (s) are getting better. Duration: Patient has had the wound for > 3 months prior to seeking treatment at the wound center Timing: Pain in wound is Intermittent (comes and goes Context: The wound occurred when the patient had a squamous cell carcinoma excised and the sutures were removed and the wound opened out Modifying Factors: Other treatment(s) tried include:antibiotics for 2 separate courses Associated Signs and Symptoms: Patient reports having difficulty standing for long periods. 70 year old diabetic whose last hemoglobin A1c was 7.9 in August 2016, has had a skin cancer removed from her left lower extremity in September 2016. The wound got infected was given Keflex and had it for about a month. She  continues to have an open ulcerated area on this leg and most recently was seen by her PCP and given doxycycline for 2 weeks. She is a ex-smoker who gave up smoking in 2000. Past medical history significant for anemia, hyperlipidemia, hypothyroidism and as noted diabetes mellitus. She is also status post hysterectomy, appendectomy and carpal tunnel release. the patient has been using some abrasive methods of cleaning the wound with hydrogen peroxide and has been leaving the wound open as per instructions of the dermatologist. She may also have had a skin substitute applied sometime in the past. 06/04/2015 -- Notes received from dermatology from Dr. Elveria Rising office -- previous history of basal cell carcinoma treated surgically and also has history of squamous cell carcinoma and actinic keratosis. Today 65 pages of notes  were reviewed by me. Most recent biopsy was for a squamous cell carcinoma on the left lateral distal calf and was seen on September 8 for this. Pathology showed clear margins. [name], [name] ([id number]) Objective Constitutional Pulse regular. Respirations normal and unlabored. Afebrile. Vitals Time Taken: 10:09 AM, Height: 63 in, Weight: 155 lbs, BMI: 27.5, Temperature: 97.6 F, Pulse: 72 bpm, Blood Pressure: 137/66 mmHg. Eyes Nonicteric. Reactive to light. Ears, Nose, Mouth, and Throat Lips, teeth, and gums WNL.Marland Kitchen Moist mucosa without lesions. Neck supple and nontender. No palpable supraclavicular or cervical adenopathy. Normal sized without goiter. Respiratory WNL. No retractions.. Cardiovascular Pedal Pulses WNL. No clubbing, cyanosis or edema. Lymphatic No adneopathy. No adenopathy. No adenopathy. Musculoskeletal Adexa without tenderness or enlargement.. Digits and nails w/o clubbing, cyanosis, infection, petechiae, ischemia, or inflammatory conditions.Marland Kitchen Psychiatric Judgement and insight Intact.. No evidence of depression, anxiety, or agitation.. General Notes: the wound is completely healed and there is no open areas around her previous ulcer. Integumentary (Hair, Skin) No suspicious lesions. No crepitus or fluctuance. No peri-wound warmth or erythema. No masses.. Wound #1 status is Healed - Epithelialized. Original cause of wound was Other Lesion. The wound is located on the Left,Lateral Lower Leg. The wound measures 0cm length x 0cm width x 0cm depth; 0cm^2 area and 0cm^3 volume. The wound is limited to skin breakdown. There is no tunneling or undermining noted. There is a medium amount of serosanguineous drainage noted. The wound margin is flat and intact. There is large (67-100%) red granulation within the wound bed. There is a small (1-33%) amount of necrotic tissue within the wound bed including Adherent Slough. The periwound skin appearance exhibited: Erythema. The  periwound skin appearance did not exhibit: Callus, Crepitus, Excoriation, Fluctuance, Rosander, Darci S. ([id number]) Friable, Induration, Localized Edema, Rash, Scarring, Dry/Scaly, Maceration, Moist, Atrophie Blanche, Cyanosis, Ecchymosis, Hemosiderin Staining, Mottled, Pallor, Rubor. The surrounding wound skin color is noted with erythema which is circumferential. Periwound temperature was noted as No Abnormality. The periwound has tenderness on palpation. Assessment Active Problems ICD-10 E11.622 - Type 2 diabetes mellitus with other skin ulcer T81.31XA - Disruption of external operation (surgical) wound, not elsewhere classified, initial encounter L97.222 - Non-pressure chronic ulcer of left calf with fat layer exposed C44.729 - Squamous cell carcinoma of skin of left lower limb, including hip The wound is completely healed and I have asked her to continue using a protective bordered foam over this for the next 10 days or so. She is encouraged to come back and see as if the need arises but other than that she is discharged from the wound care services. Plan Primary Wound Dressing: Boardered Foam Dressing - keep area covered and protected Discharge From [facility] Services: Discharge  from [place] The wound is completely healed and I have asked her to continue using a protective bordered foam over this for the next 10 days or so. She is encouraged to come back and see as if the need arises but other than that she is discharged from the wound care services. Electronic Signature(s) Signed: 06/25/2015 10:56:12 AM By: Christin Fudge MD, FACS Entered By: Christin Fudge on 06/25/2015 10:56:12 Amaal, Despres Erin Hearing ([name] ([id number]) -------------------------------------------------------------------------------- SuperBill Details Patient Name: [name] Date of Service: 06/25/2015 Medical Record Number: [id number] Patient Account Number: [id number] Date of  Birth/Sex: 1945/09/03 (70 y.o. Female) Treating RN: Macarthur Critchley Primary Care Physician: Tracie Harrier Other Clinician: Referring Physician: Tracie Harrier Treating Physician/Extender: Frann Rider in Treatment: 4 Diagnosis Coding ICD-10 Codes Code Description E11.622 Type 2 diabetes mellitus with other skin ulcer Disruption of external operation (surgical) wound, not elsewhere classified, initial T81.31XA encounter L97.222 Non-pressure chronic ulcer of left calf with fat layer exposed C44.729 Squamous cell carcinoma of skin of left lower limb, including hip Facility Procedures CPT4 Code: [id number] Description: 99213 - WOUND CARE VISIT-LEV 3 EST PT Modifier: Quantity: 1 Physician Procedures CPT4: Description Modifier Quantity Code [id number] - WC PHYS LEVEL 2 - EST PT 1 ICD-10 Description Diagnosis E11.622 Type 2 diabetes mellitus with other skin ulcer T81.31XA Disruption of external operation (surgical) wound, not elsewhere classified,  initial encounter L97.222 Non-pressure chronic ulcer of left calf with fat layer exposed C44.729 Squamous cell carcinoma of skin of left lower limb, including hip Electronic Signature(s) Signed: 06/25/2015 10:56:31 AM By: Christin Fudge MD, FACS Entered By: Christin Fudge on 06/25/2015 10:56:31
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
## 2015-07-02 ENCOUNTER — Ambulatory Visit: Payer: Medicare Other | Admitting: Surgery

## 2015-07-09 ENCOUNTER — Ambulatory Visit: Payer: Medicare Other | Admitting: Surgery

## 2015-09-13 ENCOUNTER — Other Ambulatory Visit: Payer: Self-pay | Admitting: Internal Medicine

## 2015-09-13 ENCOUNTER — Ambulatory Visit
Admission: RE | Admit: 2015-09-13 | Discharge: 2015-09-13 | Disposition: A | Discharge status: Home / Self Care | Payer: Medicare Other | Source: Ambulatory Visit | Attending: Internal Medicine | Admitting: Internal Medicine

## 2015-09-13 DIAGNOSIS — Z1231 Encounter for screening mammogram for malignant neoplasm of breast: Secondary | ICD-10-CM

## 2015-11-08 ENCOUNTER — Other Ambulatory Visit: Payer: Self-pay | Admitting: Orthopedic Surgery

## 2015-11-08 DIAGNOSIS — M5417 Radiculopathy, lumbosacral region: Secondary | ICD-10-CM

## 2015-11-08 DIAGNOSIS — R262 Difficulty in walking, not elsewhere classified: Secondary | ICD-10-CM

## 2015-11-09 ENCOUNTER — Ambulatory Visit
Admission: RE | Admit: 2015-11-09 | Discharge: 2015-11-09 | Disposition: A | Discharge status: Home / Self Care | Payer: Medicare Other | Source: Ambulatory Visit | Attending: Orthopedic Surgery | Admitting: Orthopedic Surgery

## 2015-11-09 ENCOUNTER — Ambulatory Visit: Payer: Medicare Other

## 2015-11-09 DIAGNOSIS — M5416 Radiculopathy, lumbar region: Secondary | ICD-10-CM | POA: Diagnosis not present

## 2015-11-09 DIAGNOSIS — M5126 Other intervertebral disc displacement, lumbar region: Secondary | ICD-10-CM | POA: Diagnosis not present

## 2015-11-09 DIAGNOSIS — M47896 Other spondylosis, lumbar region: Secondary | ICD-10-CM | POA: Insufficient documentation

## 2015-11-09 DIAGNOSIS — M5417 Radiculopathy, lumbosacral region: Secondary | ICD-10-CM

## 2015-11-09 DIAGNOSIS — R262 Difficulty in walking, not elsewhere classified: Secondary | ICD-10-CM

## 2016-08-18 ENCOUNTER — Other Ambulatory Visit: Payer: Self-pay | Admitting: Internal Medicine

## 2016-08-18 DIAGNOSIS — Z1231 Encounter for screening mammogram for malignant neoplasm of breast: Secondary | ICD-10-CM

## 2016-09-24 ENCOUNTER — Ambulatory Visit
Admission: RE | Admit: 2016-09-24 | Discharge: 2016-09-24 | Disposition: A | Discharge status: Home / Self Care | Payer: Medicare Other | Source: Ambulatory Visit | Attending: Internal Medicine | Admitting: Internal Medicine

## 2016-09-24 DIAGNOSIS — Z1231 Encounter for screening mammogram for malignant neoplasm of breast: Secondary | ICD-10-CM | POA: Diagnosis not present

## 2016-10-16 ENCOUNTER — Encounter: Payer: Self-pay | Admitting: *Deleted

## 2016-11-04 ENCOUNTER — Ambulatory Visit
Admission: RE | Admit: 2016-11-04 | Discharge: 2016-11-04 | Disposition: A | Discharge status: Home / Self Care | Payer: Medicare Other | Source: Ambulatory Visit | Attending: Ophthalmology | Admitting: Ophthalmology

## 2016-11-04 ENCOUNTER — Encounter
Admission: RE | Disposition: A | Discharge status: Home / Self Care | Payer: Self-pay | Source: Ambulatory Visit | Attending: Ophthalmology

## 2016-11-04 ENCOUNTER — Ambulatory Visit: Payer: Medicare Other | Admitting: Anesthesiology

## 2016-11-04 ENCOUNTER — Encounter: Payer: Self-pay | Admitting: *Deleted

## 2016-11-04 DIAGNOSIS — Z7984 Long term (current) use of oral hypoglycemic drugs: Secondary | ICD-10-CM | POA: Insufficient documentation

## 2016-11-04 DIAGNOSIS — E119 Type 2 diabetes mellitus without complications: Secondary | ICD-10-CM | POA: Insufficient documentation

## 2016-11-04 DIAGNOSIS — Z87891 Personal history of nicotine dependence: Secondary | ICD-10-CM | POA: Diagnosis not present

## 2016-11-04 DIAGNOSIS — E7800 Pure hypercholesterolemia, unspecified: Secondary | ICD-10-CM | POA: Insufficient documentation

## 2016-11-04 DIAGNOSIS — H2512 Age-related nuclear cataract, left eye: Secondary | ICD-10-CM | POA: Insufficient documentation

## 2016-11-04 DIAGNOSIS — Z7982 Long term (current) use of aspirin: Secondary | ICD-10-CM | POA: Diagnosis not present

## 2016-11-04 DIAGNOSIS — Z79899 Other long term (current) drug therapy: Secondary | ICD-10-CM | POA: Insufficient documentation

## 2016-11-04 HISTORY — PX: CATARACT EXTRACTION W/PHACO: SHX586

## 2016-11-04 HISTORY — DX: Type 2 diabetes mellitus without complications: E11.9

## 2016-11-04 HISTORY — DX: Hypothyroidism, unspecified: E03.9

## 2016-11-04 LAB — GLUCOSE, CAPILLARY: Glucose-Capillary: 120 mg/dL — ABNORMAL HIGH (ref 65–99)

## 2016-11-04 SURGERY — PHACOEMULSIFICATION, CATARACT, WITH IOL INSERTION
Anesthesia: Monitor Anesthesia Care | Site: Eye | Laterality: Left | Wound class: Clean

## 2016-11-04 MED ORDER — MOXIFLOXACIN HCL 0.5 % OP SOLN: 1.0000 [drp] | OPHTHALMIC | Status: DC | PRN

## 2016-11-04 MED ORDER — FENTANYL CITRATE (PF) 100 MCG/2ML IJ SOLN
INTRAMUSCULAR | Status: DC | PRN
  Administered 2016-11-04: 50 ug via INTRAVENOUS

## 2016-11-04 MED ORDER — ARMC OPHTHALMIC DILATING DROPS
OPHTHALMIC | Status: AC
  Administered 2016-11-04: 1 via OPHTHALMIC
  Filled 2016-11-04: qty 0.4

## 2016-11-04 MED ORDER — SODIUM CHLORIDE 0.9 % IV SOLN
INTRAVENOUS | Status: DC
  Administered 2016-11-04: 08:00:00 via INTRAVENOUS

## 2016-11-04 MED ORDER — FENTANYL CITRATE (PF) 100 MCG/2ML IJ SOLN
INTRAMUSCULAR | Status: AC
  Filled 2016-11-04: qty 2

## 2016-11-04 MED ORDER — POVIDONE-IODINE 5 % OP SOLN
OPHTHALMIC | Status: DC | PRN
  Administered 2016-11-04: 1 via OPHTHALMIC

## 2016-11-04 MED ORDER — MIDAZOLAM HCL 2 MG/2ML IJ SOLN
INTRAMUSCULAR | Status: DC | PRN
  Administered 2016-11-04: 1 mg via INTRAVENOUS

## 2016-11-04 MED ORDER — EPINEPHRINE PF 1 MG/ML IJ SOLN
INTRAOCULAR | Status: DC | PRN
  Administered 2016-11-04: 09:00:00 via OPHTHALMIC

## 2016-11-04 MED ORDER — CARBACHOL 0.01 % IO SOLN
INTRAOCULAR | Status: DC | PRN
  Administered 2016-11-04: 0.5 mL via INTRAOCULAR

## 2016-11-04 MED ORDER — ARMC OPHTHALMIC DILATING DROPS
1.0000 "application " | OPHTHALMIC | Status: AC
  Administered 2016-11-04 (×3): 1 via OPHTHALMIC

## 2016-11-04 MED ORDER — LIDOCAINE HCL (PF) 4 % IJ SOLN
INTRAMUSCULAR | Status: DC | PRN
  Administered 2016-11-04: 4 mL via OPHTHALMIC

## 2016-11-04 MED ORDER — NA CHONDROIT SULF-NA HYALURON 40-17 MG/ML IO SOLN
INTRAOCULAR | Status: DC | PRN
  Administered 2016-11-04: 1 mL via INTRAOCULAR

## 2016-11-04 MED ORDER — MOXIFLOXACIN HCL 0.5 % OP SOLN
OPHTHALMIC | Status: DC | PRN
  Administered 2016-11-04: 0.2 mL via OPHTHALMIC

## 2016-11-04 MED ORDER — MOXIFLOXACIN HCL 0.5 % OP SOLN
OPHTHALMIC | Status: AC
  Filled 2016-11-04: qty 3

## 2016-11-04 MED ORDER — MIDAZOLAM HCL 2 MG/2ML IJ SOLN
INTRAMUSCULAR | Status: AC
  Filled 2016-11-04: qty 2

## 2016-11-04 SURGICAL SUPPLY — 16 items
GLOVE BIO SURGEON STRL SZ8 (GLOVE) ×3 IMPLANT
GLOVE BIOGEL M 6.5 STRL (GLOVE) ×3 IMPLANT
GLOVE SURG LX 8.0 MICRO (GLOVE) ×2
GLOVE SURG LX STRL 8.0 MICRO (GLOVE) ×1 IMPLANT
GOWN STRL REUS W/ TWL LRG LVL3 (GOWN DISPOSABLE) ×2 IMPLANT
GOWN STRL REUS W/TWL LRG LVL3 (GOWN DISPOSABLE) ×4
LABEL CATARACT MEDS ST (LABEL) ×3 IMPLANT
LENS IOL TECNIS ITEC 24.5 (Intraocular Lens) ×3 IMPLANT
PACK CATARACT (MISCELLANEOUS) ×3 IMPLANT
PACK CATARACT BRASINGTON LX (MISCELLANEOUS) ×3 IMPLANT
PACK EYE AFTER SURG (MISCELLANEOUS) ×3 IMPLANT
SOL BSS BAG (MISCELLANEOUS) ×3
SOLUTION BSS BAG (MISCELLANEOUS) ×1 IMPLANT
SYR 5ML LL (SYRINGE) ×3 IMPLANT
WATER STERILE IRR 250ML POUR (IV SOLUTION) ×3 IMPLANT
WIPE NON LINTING 3.25X3.25 (MISCELLANEOUS) ×3 IMPLANT

## 2016-11-04 NOTE — H&P (Signed)
All labs reviewed. Abnormal studies sent to patients PCP when indicated.  Previous H&P reviewed, patient examined, there are NO CHANGES.  [name] LOUIS7/24/20188:43 AM

## 2016-11-04 NOTE — Op Note (Signed)
PREOPERATIVE DIAGNOSIS:  Nuclear sclerotic cataract of the left eye.   POSTOPERATIVE DIAGNOSIS:  Nuclear sclerotic cataract of the left eye.   OPERATIVE PROCEDURE: Procedure(s): CATARACT EXTRACTION PHACO AND INTRAOCULAR LENS PLACEMENT (IOC)   SURGEON:  Birder Robson, MD.   ANESTHESIA:  Anesthesiologist: Emmie Niemann, MD CRNA: [name], CRNA; Silvana Newness, CRNA  1.      Managed anesthesia care. 2.     0.70ml of Shugarcaine was instilled following the paracentesis   COMPLICATIONS:  None.   TECHNIQUE:   Stop and chop   DESCRIPTION OF PROCEDURE:  The patient was examined and consented in the preoperative holding area where the aforementioned topical anesthesia was applied to the left eye and then brought back to the Operating Room where the left eye was prepped and draped in the usual sterile ophthalmic fashion and a lid speculum was placed. A paracentesis was created with the side port blade and the anterior chamber was filled with viscoelastic. A near clear corneal incision was performed with the steel keratome. A continuous curvilinear capsulorrhexis was performed with a cystotome followed by the capsulorrhexis forceps. Hydrodissection and hydrodelineation were carried out with BSS on a blunt cannula. The lens was removed in a stop and chop  technique and the remaining cortical material was removed with the irrigation-aspiration handpiece. The capsular bag was inflated with viscoelastic and the Technis [id number] lens was placed in the capsular bag without complication. The remaining viscoelastic was removed from the eye with the irrigation-aspiration handpiece. The wounds were hydrated. The anterior chamber was flushed with Miostat and the eye was inflated to physiologic pressure. 0.64ml Vigamox was placed in the anterior chamber. The wounds were found to be water tight. The eye was dressed with Vigamox. The patient was given protective glasses to wear throughout the day and a shield with  which to sleep tonight. The patient was also given drops with which to begin a drop regimen today and will follow-up with me in one day.  Implant Name Type Inv. Item Serial No. Manufacturer Lot No. LRB No. Used  LENS IOL DIOP 24.5 - [id number] Intraocular Lens LENS IOL DIOP 24.5 [id number] AMO   Left 1    Procedure(s) with comments: CATARACT EXTRACTION PHACO AND INTRAOCULAR LENS PLACEMENT (IOC) (Left) - [place] 00:24.5 AP% 13.5 CDE 3.30 Fluid Pack lot # [id number]  Electronically signed: [place] 11/04/2016 9:07 AM

## 2016-11-04 NOTE — Anesthesia Postprocedure Evaluation (Signed)
Anesthesia Post Note  Patient: [name]  Procedure(s) Performed: Procedure(s) (LRB): CATARACT EXTRACTION PHACO AND INTRAOCULAR LENS PLACEMENT (IOC) (Left)  Patient location during evaluation: PACU Anesthesia Type: MAC Level of consciousness: awake and alert Pain management: pain level controlled Vital Signs Assessment: post-procedure vital signs reviewed and stable Respiratory status: spontaneous breathing, nonlabored ventilation, respiratory function stable and patient connected to nasal cannula oxygen Cardiovascular status: stable and blood pressure returned to baseline Anesthetic complications: no     Last Vitals:  Vitals:   11/04/16 0736 11/04/16 0909  BP: 128/74 (!) 134/59  Pulse: 71 65  Resp: 18 16  Temp: 36.9 C     Last Pain:  Vitals:   11/04/16 0909  TempSrc: Tympanic                 [name]

## 2016-11-04 NOTE — Anesthesia Procedure Notes (Signed)
Procedure Name: MAC Date/Time: 11/04/2016 8:50 AM Performed by: [name] Pre-anesthesia Checklist: Patient identified, Emergency Drugs available, Suction available, Patient being monitored and Timeout performed Oxygen Delivery Method: Nasal cannula Placement Confirmation: positive ETCO2

## 2016-11-04 NOTE — Anesthesia Post-op Follow-up Note (Cosign Needed)
Anesthesia QCDR form completed.        

## 2016-11-04 NOTE — Discharge Instructions (Signed)
Eye Surgery Discharge Instructions  Expect mild scratchy sensation or mild soreness. DO NOT RUB YOUR EYE!  The day of surgery:  Minimal physical activity, but bed rest is not required  No reading, computer work, or close hand work  No bending, lifting, or straining.  May watch TV  For 24 hours:  No driving, legal decisions, or alcoholic beverages  Safety precautions  Eat anything you prefer: It is better to start with liquids, then soup then solid foods.  _____ Eye patch should be worn until postoperative exam tomorrow.  ____ Solar shield eyeglasses should be worn for comfort in the sunlight/patch while sleeping  Resume all regular medications including aspirin or Coumadin if these were discontinued prior to surgery. You may shower, bathe, shave, or wash your hair. Tylenol may be taken for mild discomfort.  Call your doctor if you experience significant pain, nausea, or vomiting, fever > 101 or other signs of infection. [phone] or [phone] Specific instructions:  Follow-up Information    Birder Robson, MD Follow up.   Specialty:  Ophthalmology Why:  July 25 at 10:05am Contact information: [address] [place] [address] [phone]

## 2016-11-04 NOTE — Anesthesia Preprocedure Evaluation (Signed)
Anesthesia Evaluation  Patient identified by MRN, date of birth, ID band Patient awake    Reviewed: Allergy & Precautions, NPO status , Patient's Chart, lab work & pertinent test results  History of Anesthesia Complications Negative for: history of anesthetic complications  Airway Mallampati: III  TM Distance: >3 FB Neck ROM: Full    Dental no notable dental hx.    Pulmonary neg sleep apnea, neg COPD, former smoker,    breath sounds clear to auscultation- rhonchi (-) wheezing      Cardiovascular Exercise Tolerance: Good (-) hypertension(-) CAD, (-) Past MI and (-) Cardiac Stents  Rhythm:Regular Rate:Normal - Systolic murmurs and - Diastolic murmurs    Neuro/Psych negative neurological ROS  negative psych ROS   GI/Hepatic negative GI ROS, Neg liver ROS,   Endo/Other  diabetes, Oral Hypoglycemic AgentsHypothyroidism   Renal/GU negative Renal ROS     Musculoskeletal negative musculoskeletal ROS (+)   Abdominal (+) - obese,   Peds  Hematology negative hematology ROS (+)   Anesthesia Other Findings Past Medical History: 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, 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, 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, right eyebrow No date: Diabetes mellitus without complication (HCC) No date: Hypothyroidism   Reproductive/Obstetrics                             Anesthesia Physical Anesthesia Plan  ASA: II  Anesthesia Plan: MAC   Post-op Pain Management:    Induction: Intravenous  PONV Risk Score and Plan: 2 and Midazolam  Airway Management Planned: Natural Airway  Additional Equipment:   Intra-op Plan:   Post-operative Plan:   Informed Consent: I have reviewed the patients History and Physical, chart, labs and discussed the procedure including the risks, benefits and alternatives for the proposed anesthesia with the patient or  authorized representative who has indicated his/her understanding and acceptance.     Plan Discussed with: CRNA and Anesthesiologist  Anesthesia Plan Comments:         Anesthesia Quick Evaluation

## 2016-11-04 NOTE — Transfer of Care (Signed)
Immediate Anesthesia Transfer of Care Note  Patient: [name]  Procedure(s) Performed: Procedure(s) with comments: CATARACT EXTRACTION PHACO AND INTRAOCULAR LENS PLACEMENT (IOC) (Left) - [place] 00:24.5 AP% 13.5 CDE 3.30 Fluid Pack lot # [id number]  Patient Location: PACU  Anesthesia Type:MAC  Level of Consciousness: awake, alert  and oriented  Airway & Oxygen Therapy: Patient Spontanous Breathing  Post-op Assessment: Report given to RN and Post -op Vital signs reviewed and stable  Post vital signs: Reviewed and stable  Last Vitals:  Vitals:   11/04/16 0736  BP: 128/74  Pulse: 71  Resp: 18  Temp: 36.9 C    Last Pain:  Vitals:   11/04/16 0736  TempSrc: Tympanic         Complications: No apparent anesthesia complications

## 2016-11-24 ENCOUNTER — Encounter: Payer: Self-pay | Admitting: *Deleted

## 2016-11-25 ENCOUNTER — Encounter
Admission: RE | Disposition: A | Discharge status: Home / Self Care | Payer: Self-pay | Source: Ambulatory Visit | Attending: Ophthalmology

## 2016-11-25 ENCOUNTER — Ambulatory Visit: Payer: Medicare Other | Admitting: Anesthesiology

## 2016-11-25 ENCOUNTER — Encounter: Payer: Self-pay | Admitting: *Deleted

## 2016-11-25 ENCOUNTER — Ambulatory Visit
Admission: RE | Admit: 2016-11-25 | Discharge: 2016-11-25 | Disposition: A | Discharge status: Home / Self Care | Payer: Medicare Other | Source: Ambulatory Visit | Attending: Ophthalmology | Admitting: Ophthalmology

## 2016-11-25 DIAGNOSIS — Z7984 Long term (current) use of oral hypoglycemic drugs: Secondary | ICD-10-CM | POA: Diagnosis not present

## 2016-11-25 DIAGNOSIS — Z90710 Acquired absence of both cervix and uterus: Secondary | ICD-10-CM | POA: Diagnosis not present

## 2016-11-25 DIAGNOSIS — Z87891 Personal history of nicotine dependence: Secondary | ICD-10-CM | POA: Insufficient documentation

## 2016-11-25 DIAGNOSIS — Z79899 Other long term (current) drug therapy: Secondary | ICD-10-CM | POA: Insufficient documentation

## 2016-11-25 DIAGNOSIS — Z85828 Personal history of other malignant neoplasm of skin: Secondary | ICD-10-CM | POA: Insufficient documentation

## 2016-11-25 DIAGNOSIS — Z9889 Other specified postprocedural states: Secondary | ICD-10-CM | POA: Insufficient documentation

## 2016-11-25 DIAGNOSIS — E119 Type 2 diabetes mellitus without complications: Secondary | ICD-10-CM | POA: Insufficient documentation

## 2016-11-25 DIAGNOSIS — E7800 Pure hypercholesterolemia, unspecified: Secondary | ICD-10-CM | POA: Diagnosis not present

## 2016-11-25 DIAGNOSIS — Z881 Allergy status to other antibiotic agents status: Secondary | ICD-10-CM | POA: Insufficient documentation

## 2016-11-25 DIAGNOSIS — Z888 Allergy status to other drugs, medicaments and biological substances status: Secondary | ICD-10-CM | POA: Diagnosis not present

## 2016-11-25 DIAGNOSIS — H2511 Age-related nuclear cataract, right eye: Secondary | ICD-10-CM | POA: Diagnosis not present

## 2016-11-25 DIAGNOSIS — E079 Disorder of thyroid, unspecified: Secondary | ICD-10-CM | POA: Insufficient documentation

## 2016-11-25 DIAGNOSIS — Z7982 Long term (current) use of aspirin: Secondary | ICD-10-CM | POA: Diagnosis not present

## 2016-11-25 DIAGNOSIS — Z885 Allergy status to narcotic agent status: Secondary | ICD-10-CM | POA: Insufficient documentation

## 2016-11-25 DIAGNOSIS — M810 Age-related osteoporosis without current pathological fracture: Secondary | ICD-10-CM | POA: Insufficient documentation

## 2016-11-25 HISTORY — DX: Pure hypercholesterolemia, unspecified: E78.00

## 2016-11-25 HISTORY — PX: CATARACT EXTRACTION W/PHACO: SHX586

## 2016-11-25 LAB — GLUCOSE, CAPILLARY: GLUCOSE-CAPILLARY: 123 mg/dL — AB (ref 65–99)

## 2016-11-25 SURGERY — PHACOEMULSIFICATION, CATARACT, WITH IOL INSERTION
Anesthesia: Monitor Anesthesia Care | Site: Eye | Laterality: Right | Wound class: Clean

## 2016-11-25 MED ORDER — CARBACHOL 0.01 % IO SOLN
INTRAOCULAR | Status: DC | PRN
  Administered 2016-11-25: .5 mL via INTRAOCULAR

## 2016-11-25 MED ORDER — MOXIFLOXACIN HCL 0.5 % OP SOLN: 1.0000 [drp] | OPHTHALMIC | Status: DC | PRN

## 2016-11-25 MED ORDER — MIDAZOLAM HCL 2 MG/2ML IJ SOLN
INTRAMUSCULAR | Status: AC
  Filled 2016-11-25: qty 2

## 2016-11-25 MED ORDER — NA CHONDROIT SULF-NA HYALURON 40-17 MG/ML IO SOLN
INTRAOCULAR | Status: DC | PRN
  Administered 2016-11-25: 1 mL via INTRAOCULAR

## 2016-11-25 MED ORDER — ARMC OPHTHALMIC DILATING DROPS
OPHTHALMIC | Status: AC
  Administered 2016-11-25: 1 via OPHTHALMIC
  Filled 2016-11-25: qty 0.4

## 2016-11-25 MED ORDER — SODIUM CHLORIDE 0.9 % IV SOLN
INTRAVENOUS | Status: DC
  Administered 2016-11-25: 50 mL/h via INTRAVENOUS

## 2016-11-25 MED ORDER — EPINEPHRINE PF 1 MG/ML IJ SOLN
INTRAOCULAR | Status: DC | PRN
  Administered 2016-11-25: 1 mL via OPHTHALMIC

## 2016-11-25 MED ORDER — FENTANYL CITRATE (PF) 100 MCG/2ML IJ SOLN
INTRAMUSCULAR | Status: DC | PRN
  Administered 2016-11-25: 25 ug via INTRAVENOUS

## 2016-11-25 MED ORDER — LIDOCAINE HCL (PF) 4 % IJ SOLN
INTRAMUSCULAR | Status: DC | PRN
  Administered 2016-11-25: 2 mL via OPHTHALMIC

## 2016-11-25 MED ORDER — FENTANYL CITRATE (PF) 100 MCG/2ML IJ SOLN
INTRAMUSCULAR | Status: AC
  Filled 2016-11-25: qty 2

## 2016-11-25 MED ORDER — MIDAZOLAM HCL 2 MG/2ML IJ SOLN
INTRAMUSCULAR | Status: DC | PRN
  Administered 2016-11-25: 1 mg via INTRAVENOUS

## 2016-11-25 MED ORDER — MOXIFLOXACIN HCL 0.5 % OP SOLN
OPHTHALMIC | Status: DC | PRN
  Administered 2016-11-25: 0.2 mL via OPHTHALMIC

## 2016-11-25 MED ORDER — MOXIFLOXACIN HCL 0.5 % OP SOLN
OPHTHALMIC | Status: AC
  Filled 2016-11-25: qty 3

## 2016-11-25 MED ORDER — ARMC OPHTHALMIC DILATING DROPS
1.0000 "application " | OPHTHALMIC | Status: AC
  Administered 2016-11-25 (×3): 1 via OPHTHALMIC

## 2016-11-25 MED ORDER — POVIDONE-IODINE 5 % OP SOLN
OPHTHALMIC | Status: DC | PRN
  Administered 2016-11-25: 1 via OPHTHALMIC

## 2016-11-25 SURGICAL SUPPLY — 16 items
GLOVE BIO SURGEON STRL SZ8 (GLOVE) ×3 IMPLANT
GLOVE BIOGEL M 6.5 STRL (GLOVE) ×3 IMPLANT
GLOVE SURG LX 8.0 MICRO (GLOVE) ×2
GLOVE SURG LX STRL 8.0 MICRO (GLOVE) ×1 IMPLANT
GOWN STRL REUS W/ TWL LRG LVL3 (GOWN DISPOSABLE) ×2 IMPLANT
GOWN STRL REUS W/TWL LRG LVL3 (GOWN DISPOSABLE) ×4
LABEL CATARACT MEDS ST (LABEL) ×3 IMPLANT
LENS IOL TECNIS ITEC 24.5 (Intraocular Lens) ×3 IMPLANT
PACK CATARACT (MISCELLANEOUS) ×3 IMPLANT
PACK CATARACT BRASINGTON LX (MISCELLANEOUS) ×3 IMPLANT
PACK EYE AFTER SURG (MISCELLANEOUS) ×3 IMPLANT
SOL BSS BAG (MISCELLANEOUS) ×3
SOLUTION BSS BAG (MISCELLANEOUS) ×1 IMPLANT
SYR 5ML LL (SYRINGE) ×3 IMPLANT
WATER STERILE IRR 250ML POUR (IV SOLUTION) ×3 IMPLANT
WIPE NON LINTING 3.25X3.25 (MISCELLANEOUS) ×3 IMPLANT

## 2016-11-25 NOTE — Anesthesia Post-op Follow-up Note (Signed)
Anesthesia QCDR form completed.        

## 2016-11-25 NOTE — H&P (Signed)
All labs reviewed. Abnormal studies sent to patients PCP when indicated.  Previous H&P reviewed, patient examined, there are NO CHANGES.  [name] LOUIS8/14/20189:05 AM

## 2016-11-25 NOTE — Anesthesia Postprocedure Evaluation (Signed)
Anesthesia Post Note  Patient: [name]  Procedure(s) Performed: Procedure(s) (LRB): CATARACT EXTRACTION PHACO AND INTRAOCULAR LENS PLACEMENT (IOC) (Right)  Patient location during evaluation: PACU Anesthesia Type: MAC Level of consciousness: awake and alert Pain management: pain level controlled Vital Signs Assessment: post-procedure vital signs reviewed and stable Respiratory status: spontaneous breathing, nonlabored ventilation, respiratory function stable and patient connected to nasal cannula oxygen Cardiovascular status: stable and blood pressure returned to baseline Anesthetic complications: no     Last Vitals:  Vitals:   11/25/16 0746 11/25/16 0931  BP: 139/63 131/68  Pulse: 69 66  Resp: 16 16  Temp: 36.9 C   SpO2: 100% 97%    Last Pain:  Vitals:   11/25/16 0931  TempSrc: Temporal  PainSc: 0-No pain                 [name]

## 2016-11-25 NOTE — Op Note (Signed)
PREOPERATIVE DIAGNOSIS:  Nuclear sclerotic cataract of the right eye.   POSTOPERATIVE DIAGNOSIS:  nuclear sclerotic cataract right eye   OPERATIVE PROCEDURE: Procedure(s): CATARACT EXTRACTION PHACO AND INTRAOCULAR LENS PLACEMENT (IOC)   SURGEON:  Birder Robson, MD.   ANESTHESIA:  Anesthesiologist: Gunnar Fusi, MD CRNA: [name], CRNA  1.      Managed anesthesia care. 2.      0.73ml of Shugarcaine was instilled in the eye following the paracentesis.   COMPLICATIONS:  None.   TECHNIQUE:   Stop and chop   DESCRIPTION OF PROCEDURE:  The patient was examined and consented in the preoperative holding area where the aforementioned topical anesthesia was applied to the right eye and then brought back to the Operating Room where the right eye was prepped and draped in the usual sterile ophthalmic fashion and a lid speculum was placed. A paracentesis was created with the side port blade and the anterior chamber was filled with viscoelastic. A near clear corneal incision was performed with the steel keratome. A continuous curvilinear capsulorrhexis was performed with a cystotome followed by the capsulorrhexis forceps. Hydrodissection and hydrodelineation were carried out with BSS on a blunt cannula. The lens was removed in a stop and chop  technique and the remaining cortical material was removed with the irrigation-aspiration handpiece. The capsular bag was inflated with viscoelastic and the Technis [id number]  lens was placed in the capsular bag without complication. The remaining viscoelastic was removed from the eye with the irrigation-aspiration handpiece. The wounds were hydrated. The anterior chamber was flushed with Miostat and the eye was inflated to physiologic pressure. 0.84ml of Vigamox was placed in the anterior chamber. The wounds were found to be water tight. The eye was dressed with Vigamox. The patient was given protective glasses to wear throughout the day and a shield with which  to sleep tonight. The patient was also given drops with which to begin a drop regimen today and will follow-up with me in one day.  Implant Name Type Inv. Item Serial No. Manufacturer Lot No. LRB No. Used  LENS IOL DIOP 24.5 - [id number] Intraocular Lens LENS IOL DIOP 24.5 [id number]   Right 1   Procedure(s) with comments: CATARACT EXTRACTION PHACO AND INTRAOCULAR LENS PLACEMENT (IOC) (Right) - [place] 00:32 AP% 15.7 CDE 5.00 Fluid pack lot # [id number]  Electronically signed: [place] 11/25/2016 9:29 AM

## 2016-11-25 NOTE — Discharge Instructions (Signed)
Eye Surgery Discharge Instructions  Expect mild scratchy sensation or mild soreness. DO NOT RUB YOUR EYE!  The day of surgery:  Minimal physical activity, but bed rest is not required  No reading, computer work, or close hand work  No bending, lifting, or straining.  May watch TV  For 24 hours:  No driving, legal decisions, or alcoholic beverages  Safety precautions  Eat anything you prefer: It is better to start with liquids, then soup then solid foods.  _____ Eye patch should be worn until postoperative exam tomorrow.  ____ Solar shield eyeglasses should be worn for comfort in the sunlight/patch while sleeping  Resume all regular medications including aspirin or Coumadin if these were discontinued prior to surgery. You may shower, bathe, shave, or wash your hair. Tylenol may be taken for mild discomfort.  Call your doctor if you experience significant pain, nausea, or vomiting, fever > 101 or other signs of infection. [phone] or [phone] Specific instructions:  Follow-up Information    Birder Robson, MD Follow up.   Specialty:  Ophthalmology Why:  August 15 at 10:25am Contact information: [address] [place] [address] [phone]

## 2016-11-25 NOTE — Anesthesia Procedure Notes (Signed)
Procedure Name: MAC Date/Time: 11/25/2016 9:09 AM Performed by: [name] Pre-anesthesia Checklist: Patient identified, Emergency Drugs available, Suction available, Patient being monitored and Timeout performed Patient Re-evaluated:Patient Re-evaluated prior to induction Oxygen Delivery Method: Nasal cannula Placement Confirmation: positive ETCO2

## 2016-11-25 NOTE — Anesthesia Preprocedure Evaluation (Signed)
Anesthesia Evaluation  Patient identified by MRN, date of birth, ID band Patient awake    Reviewed: Allergy & Precautions, NPO status , Patient's Chart, lab work & pertinent test results  History of Anesthesia Complications Negative for: history of anesthetic complications  Airway Mallampati: II       Dental   Pulmonary neg pulmonary ROS, former smoker,           Cardiovascular negative cardio ROS       Neuro/Psych negative neurological ROS     GI/Hepatic negative GI ROS, Neg liver ROS,   Endo/Other  diabetes, Oral Hypoglycemic AgentsHypothyroidism   Renal/GU negative Renal ROS     Musculoskeletal   Abdominal   Peds  Hematology   Anesthesia Other Findings   Reproductive/Obstetrics                            Anesthesia Physical Anesthesia Plan  ASA: III  Anesthesia Plan: MAC   Post-op Pain Management:    Induction:   PONV Risk Score and Plan:   Airway Management Planned:   Additional Equipment:   Intra-op Plan:   Post-operative Plan:   Informed Consent: I have reviewed the patients History and Physical, chart, labs and discussed the procedure including the risks, benefits and alternatives for the proposed anesthesia with the patient or authorized representative who has indicated his/her understanding and acceptance.     Plan Discussed with:   Anesthesia Plan Comments:         Anesthesia Quick Evaluation

## 2016-11-25 NOTE — Transfer of Care (Signed)
Immediate Anesthesia Transfer of Care Note  Patient: [name]  Procedure(s) Performed: Procedure(s) with comments: CATARACT EXTRACTION PHACO AND INTRAOCULAR LENS PLACEMENT (IOC) (Right) - [place] 00:32 AP% 15.7 CDE 5.00 Fluid pack lot # [id number]  Patient Location: PACU  Anesthesia Type:MAC  Level of Consciousness: awake, alert  and oriented  Airway & Oxygen Therapy: Patient Spontanous Breathing  Post-op Assessment: Report given to RN and Post -op Vital signs reviewed and stable  Post vital signs: Reviewed and stable  Last Vitals:  Vitals:   11/25/16 0746 11/25/16 0931  BP: 139/63 131/68  Pulse: 69 66  Resp: 16 16  Temp: 36.9 C   SpO2: 100% 97%    Last Pain:  Vitals:   11/25/16 0931  TempSrc: Temporal  PainSc: 0-No pain      Patients Stated Pain Goal: 0 (58/09/98 3382)  Complications: No apparent anesthesia complications

## 2017-08-21 ENCOUNTER — Other Ambulatory Visit: Payer: Self-pay | Admitting: Internal Medicine

## 2017-08-21 DIAGNOSIS — Z1231 Encounter for screening mammogram for malignant neoplasm of breast: Secondary | ICD-10-CM

## 2017-09-28 ENCOUNTER — Ambulatory Visit
Admission: RE | Admit: 2017-09-28 | Discharge: 2017-09-28 | Disposition: A | Discharge status: Home / Self Care | Payer: Medicare Other | Source: Ambulatory Visit | Attending: Internal Medicine | Admitting: Internal Medicine

## 2017-09-28 DIAGNOSIS — Z1231 Encounter for screening mammogram for malignant neoplasm of breast: Secondary | ICD-10-CM | POA: Diagnosis not present

## 2018-08-26 ENCOUNTER — Other Ambulatory Visit: Payer: Self-pay | Admitting: Internal Medicine

## 2018-08-26 DIAGNOSIS — Z1231 Encounter for screening mammogram for malignant neoplasm of breast: Secondary | ICD-10-CM

## 2018-09-30 ENCOUNTER — Other Ambulatory Visit: Payer: Self-pay

## 2018-09-30 ENCOUNTER — Ambulatory Visit
Admission: RE | Admit: 2018-09-30 | Discharge: 2018-09-30 | Disposition: A | Discharge status: Home / Self Care | Payer: Medicare Other | Source: Ambulatory Visit | Attending: Internal Medicine | Admitting: Internal Medicine

## 2018-09-30 DIAGNOSIS — Z1231 Encounter for screening mammogram for malignant neoplasm of breast: Secondary | ICD-10-CM | POA: Diagnosis present

## 2019-08-16 ENCOUNTER — Other Ambulatory Visit: Payer: Self-pay | Admitting: Internal Medicine

## 2019-08-16 DIAGNOSIS — Z1231 Encounter for screening mammogram for malignant neoplasm of breast: Secondary | ICD-10-CM

## 2019-10-03 ENCOUNTER — Ambulatory Visit
Admission: RE | Admit: 2019-10-03 | Discharge: 2019-10-03 | Disposition: A | Discharge status: Home / Self Care | Payer: Medicare Other | Source: Ambulatory Visit | Attending: Internal Medicine | Admitting: Internal Medicine

## 2019-10-03 DIAGNOSIS — Z1231 Encounter for screening mammogram for malignant neoplasm of breast: Secondary | ICD-10-CM | POA: Diagnosis present

## 2020-08-30 ENCOUNTER — Other Ambulatory Visit: Payer: Self-pay | Admitting: Internal Medicine

## 2020-08-30 DIAGNOSIS — Z1231 Encounter for screening mammogram for malignant neoplasm of breast: Secondary | ICD-10-CM

## 2020-10-03 ENCOUNTER — Other Ambulatory Visit: Payer: Self-pay

## 2020-10-03 ENCOUNTER — Ambulatory Visit
Admission: RE | Admit: 2020-10-03 | Discharge: 2020-10-03 | Disposition: A | Discharge status: Home / Self Care | Payer: Medicare Other | Source: Ambulatory Visit | Attending: Internal Medicine | Admitting: Internal Medicine

## 2020-10-03 DIAGNOSIS — Z1231 Encounter for screening mammogram for malignant neoplasm of breast: Secondary | ICD-10-CM | POA: Diagnosis not present

## 2020-10-10 ENCOUNTER — Other Ambulatory Visit: Payer: Self-pay | Admitting: Internal Medicine

## 2020-10-10 DIAGNOSIS — R928 Other abnormal and inconclusive findings on diagnostic imaging of breast: Secondary | ICD-10-CM

## 2020-10-10 DIAGNOSIS — N6310 Unspecified lump in the right breast, unspecified quadrant: Secondary | ICD-10-CM

## 2020-10-12 ENCOUNTER — Ambulatory Visit
Admission: RE | Admit: 2020-10-12 | Discharge: 2020-10-12 | Disposition: A | Discharge status: Home / Self Care | Payer: Medicare Other | Source: Ambulatory Visit | Attending: Internal Medicine | Admitting: Internal Medicine

## 2020-10-12 ENCOUNTER — Other Ambulatory Visit: Payer: Self-pay

## 2020-10-12 DIAGNOSIS — R928 Other abnormal and inconclusive findings on diagnostic imaging of breast: Secondary | ICD-10-CM | POA: Diagnosis present

## 2020-10-12 DIAGNOSIS — N6310 Unspecified lump in the right breast, unspecified quadrant: Secondary | ICD-10-CM | POA: Diagnosis present

## 2021-01-09 ENCOUNTER — Emergency Department: Payer: Medicare Other

## 2021-01-09 ENCOUNTER — Emergency Department
Admission: EM | Admit: 2021-01-09 | Discharge: 2021-01-10 | Disposition: A | Discharge status: Home / Self Care | Payer: Medicare Other | Attending: Emergency Medicine | Admitting: Emergency Medicine

## 2021-01-09 ENCOUNTER — Encounter: Payer: Self-pay | Admitting: Emergency Medicine

## 2021-01-09 DIAGNOSIS — Z7982 Long term (current) use of aspirin: Secondary | ICD-10-CM | POA: Diagnosis not present

## 2021-01-09 DIAGNOSIS — Z85828 Personal history of other malignant neoplasm of skin: Secondary | ICD-10-CM | POA: Insufficient documentation

## 2021-01-09 DIAGNOSIS — Z87891 Personal history of nicotine dependence: Secondary | ICD-10-CM | POA: Diagnosis not present

## 2021-01-09 DIAGNOSIS — E039 Hypothyroidism, unspecified: Secondary | ICD-10-CM | POA: Insufficient documentation

## 2021-01-09 DIAGNOSIS — E119 Type 2 diabetes mellitus without complications: Secondary | ICD-10-CM | POA: Insufficient documentation

## 2021-01-09 DIAGNOSIS — Z79899 Other long term (current) drug therapy: Secondary | ICD-10-CM | POA: Diagnosis not present

## 2021-01-09 DIAGNOSIS — R079 Chest pain, unspecified: Secondary | ICD-10-CM | POA: Diagnosis present

## 2021-01-09 LAB — CBC
HCT: 34.1 % — ABNORMAL LOW (ref 36.0–46.0)
Hemoglobin: 11.4 g/dL — ABNORMAL LOW (ref 12.0–15.0)
MCH: 31.8 pg (ref 26.0–34.0)
MCHC: 33.4 g/dL (ref 30.0–36.0)
MCV: 95 fL (ref 80.0–100.0)
Platelets: 294 10*3/uL (ref 150–400)
RBC: 3.59 MIL/uL — ABNORMAL LOW (ref 3.87–5.11)
RDW: 13.1 % (ref 11.5–15.5)
WBC: 8.6 10*3/uL (ref 4.0–10.5)
nRBC: 0 % (ref 0.0–0.2)

## 2021-01-09 LAB — BASIC METABOLIC PANEL
Anion gap: 9 (ref 5–15)
BUN: 20 mg/dL (ref 8–23)
CO2: 29 mmol/L (ref 22–32)
Calcium: 9.5 mg/dL (ref 8.9–10.3)
Chloride: 102 mmol/L (ref 98–111)
Creatinine, Ser: 0.93 mg/dL (ref 0.44–1.00)
GFR, Estimated: >60 mL/min (ref >60)
Glucose, Bld: 307 mg/dL — ABNORMAL HIGH (ref 70–99)
Potassium: 4.6 mmol/L (ref 3.5–5.1)
Sodium: 140 mmol/L (ref 135–145)

## 2021-01-09 LAB — TROPONIN I (HIGH SENSITIVITY): Troponin I (High Sensitivity): 8 ng/L (ref <18)

## 2021-01-09 NOTE — ED Triage Notes (Signed)
Pt arrived via POV with reports of multiple episodes of chest pain today, pt states the pain started a few days ago and was seen by PCP and referred to cardiology and has appt on Friday. Pt describes the pain as a burning pain, pt states she has been taking medication for acid reflux but states the pain does not feel like acid reflux.  Pt reports she does get short of breath with the chest pain.

## 2021-01-10 LAB — HEPATIC FUNCTION PANEL
ALT: 23 U/L (ref 0–44)
AST: 30 U/L (ref 15–41)
Albumin: 4.4 g/dL (ref 3.5–5.0)
Alkaline Phosphatase: 51 U/L (ref 38–126)
Bilirubin, Direct: <0.1 mg/dL (ref 0.0–0.2)
Total Bilirubin: 0.6 mg/dL (ref 0.3–1.2)
Total Protein: 6.5 g/dL (ref 6.5–8.1)

## 2021-01-10 LAB — LIPASE, BLOOD: Lipase: 39 U/L (ref 11–51)

## 2021-01-10 LAB — D-DIMER, QUANTITATIVE: D-Dimer, Quant: 0.4 ug/mL-FEU (ref 0.00–0.50)

## 2021-01-10 LAB — TROPONIN I (HIGH SENSITIVITY): Troponin I (High Sensitivity): 9 ng/L (ref <18)

## 2021-01-10 LAB — BRAIN NATRIURETIC PEPTIDE: B Natriuretic Peptide: 32.1 pg/mL (ref 0.0–100.0)

## 2021-01-10 MED ORDER — FAMOTIDINE 20 MG IN NS 100 ML IVPB
20.0000 mg | Freq: Once | INTRAVENOUS | Status: AC
  Administered 2021-01-10: 20 mg via INTRAVENOUS
  Filled 2021-01-10: qty 100

## 2021-01-10 NOTE — ED Provider Notes (Signed)
[facility] Emergency Department Provider Note   ____________________________________________   Event Date/Time   First MD Initiated Contact with Patient 01/10/21 0015     (approximate)  I have reviewed the triage vital signs and the nursing notes.   HISTORY  Chief Complaint Chest Pain    HPI [name] is a 75 y.o. female who presents to the ED from home with a chief complaint of chest pain.  Patient mowed the yard with a push mower several weeks ago.  Since then she has had occasional burning in her chest only with movement.  She was seen by her PCP and referred to cardiology; has appointment this Friday morning at 10 AM.  Denies pain at rest.  Describes central burning sensation on movement, associated with dyspnea on exertion.  Denies associated diaphoresis, palpitations, nausea/vomiting or dizziness.  Denies fever, cough, abdominal pain, or diarrhea.  Presents tonight because pain became frequent after eating popcorn shrimp.  She is unvaccinated and had COVID-19 in August; did not take antivirals.     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   Past Surgical History:  Procedure Laterality Date   ABDOMINAL HYSTERECTOMY     CATARACT EXTRACTION W/PHACO Left 11/04/2016   Procedure: CATARACT EXTRACTION PHACO AND INTRAOCULAR LENS PLACEMENT (IOC);  Surgeon: Birder Robson, MD;  Location: [facility] ORS;  Service: Ophthalmology;  Laterality: Left;  [place] 00:24.5 AP% 13.5 CDE 3.30 Fluid Pack lot # [id number]   CATARACT EXTRACTION W/PHACO Right 11/25/2016   Procedure: CATARACT EXTRACTION PHACO AND INTRAOCULAR LENS PLACEMENT (IOC);  Surgeon: Birder Robson, MD;  Location: [facility] ORS;  Service:  Ophthalmology;  Laterality: Right;  [place] 00:32 AP% 15.7 CDE 5.00 Fluid pack lot # [id number]   CTR     release of tendon only    Prior to Admission medications   Medication Sig Start Date End Date Taking? Authorizing Provider  acetaminophen (TYLENOL) 500 MG tablet Take 1,000 mg by mouth 2 (two) times daily as needed for mild pain.    [provider]  alendronate (FOSAMAX) 70 MG tablet Take 70 mg by mouth every Sunday. Take with a full glass of water on an empty stomach.     [provider]  Ascorbic Acid (VITAMIN C) 1000 MG tablet Take 1,000 mg by mouth daily with lunch.    [provider]  aspirin EC 81 MG tablet Take 81 mg by mouth daily with supper.    [provider]  Calcium Carbonate-Vitamin D (CALCIUM 600+D PO) Take 1 tablet by mouth 2 (two) times daily with a meal.    [provider]  CINNAMON PO Take 1,000 mg by mouth 2 (two) times daily with a meal.    [provider]  clotrimazole-betamethasone (LOTRISONE) cream Apply 1 application topically daily as needed for rash. 10/06/16   [provider]  diphenhydrAMINE (BENADRYL) 25 MG tablet Take 50 mg by mouth daily as needed for itching.    [provider]  diphenhydramine-acetaminophen (TYLENOL PM) 25-500 MG TABS tablet Take 2 tablets by mouth at bedtime as needed (sleep/pain).    [provider]  Exenatide ER (BYDUREON) 2 MG PEN Inject 2  mg into the skin as directed. Every other week    [provider]  ferrous sulfate 325 (65 FE) MG tablet Take 325 mg by mouth daily with lunch.    [provider]  glipiZIDE (GLUCOTROL) 10 MG tablet Take 10 mg by mouth 2 (two) times daily before a meal.    [provider]  levothyroxine (SYNTHROID, LEVOTHROID) 125 MCG tablet Take 125 mcg by mouth daily before breakfast.    [provider]  levothyroxine (SYNTHROID, LEVOTHROID) 25 MCG tablet Take 25 mcg by mouth 2 (two) times a week. Take with  135mcg daily on Saturdays and Sundays    [provider]  lovastatin (MEVACOR) 40 MG tablet Take 40 mg by mouth every evening.     [provider]  magnesium oxide (MAG-OX) 400 MG tablet Take 400 mg by mouth daily with breakfast.    [provider]  Multiple Vitamin (MULTIVITAMIN WITH MINERALS) TABS tablet Take 1 tablet by mouth daily with lunch.    [provider]  Multiple Vitamins-Minerals (HAIR SKIN AND NAILS FORMULA) TABS Take 1 tablet by mouth 2 (two) times daily with a meal.    [provider]  Multiple Vitamins-Minerals (ICAPS PO) Take 1 tablet by mouth daily with breakfast.    [provider]  Nutritional Supplements (ESTROVEN NIGHTTIME) TABS Take 1 tablet by mouth at bedtime.    [provider]  Omega-3 Fatty Acids (FISH OIL) 1000 MG CAPS Take 1,000 mg by mouth 2 (two) times daily.    [provider]  Tetrahydrozoline HCl (VISINE OP) Place 1 drop into both eyes daily as needed (dry eyes).    [provider]  vitamin B-12 (CYANOCOBALAMIN) 1000 MCG tablet Take 1,000 mcg by mouth daily with breakfast.    [provider]  vitamin E 600 UNIT capsule Take 600 Units by mouth daily with breakfast.    [provider]    Allergies Ibuprofen, Oxycodone-acetaminophen, Propoxyphene, Tramadol, Ciprofloxacin, Doxycycline, Entex t [pseudoephedrine-guaifenesin], Guaifenesin & derivatives, and Hydrocodone  Family History  Problem Relation Age of Onset   Cancer Mother 64       cervical   Breast cancer Neg Hx     Social History Social History   Tobacco Use   Smoking status: Former    Packs/day: 0.50    Years: 10.00    Pack years: 5.00    Types: Cigarettes    Quit date: 04/14/1998    Years since quitting: 22.7   Smokeless tobacco: Never  Vaping Use   Vaping Use: Never used  Substance Use Topics   Alcohol use: No    Review of Systems  Constitutional: No fever/chills Eyes: No visual  changes. ENT: No sore throat. Cardiovascular: Positive for chest pain. Respiratory: Positive for shortness of breath. Gastrointestinal: No abdominal pain.  No nausea, no vomiting.  No diarrhea.  No constipation. Genitourinary: Negative for dysuria. Musculoskeletal: Negative for back pain. Skin: Negative for rash. Neurological: Negative for headaches, focal weakness or numbness.   ____________________________________________   PHYSICAL EXAM:  VITAL SIGNS: ED Triage Vitals  Enc Vitals Group     BP 01/09/21 2140 (!) 160/69     Pulse Rate 01/09/21 2140 89     Resp 01/09/21 2140 18     Temp 01/09/21 2140 98.6 F (37 C)     Temp Source 01/09/21 2140 Oral     SpO2 01/09/21 2140 98 %     Weight 01/09/21 2143 143 lb (64.9 kg)  Height 01/09/21 2143 5\' 3"  (1.6 m)     Head Circumference --      Peak Flow --      Pain Score 01/09/21 2143 2     Pain Loc --      Pain Edu? --      Excl. in [place]? --     Constitutional: Alert and oriented.  Elderly appearing and in no acute distress. Eyes: Conjunctivae are normal. PERRL. EOMI. Head: Atraumatic. Nose: No congestion/rhinnorhea. Mouth/Throat: Mucous membranes are moist.   Neck: No stridor.   Cardiovascular: Normal rate, regular rhythm. Grossly normal heart sounds.  Good peripheral circulation. Respiratory: Normal respiratory effort.  No retractions. Lungs CTAB. Gastrointestinal: Soft and nontender to light or deep palpation. No distention. No abdominal bruits. No CVA tenderness. Musculoskeletal: No lower extremity tenderness nor edema.  No joint effusions. Neurologic:  Normal speech and language. No gross focal neurologic deficits are appreciated. No gait instability. Skin:  Skin is warm, dry and intact. No rash noted. Psychiatric: Mood and affect are normal. Speech and behavior are normal.  ____________________________________________   LABS (all labs ordered are listed, but only abnormal results are displayed)  Labs Reviewed   BASIC METABOLIC PANEL - Abnormal; Notable for the following components:      Result Value   Glucose, Bld 307 (*)    All other components within normal limits  CBC - Abnormal; Notable for the following components:   RBC 3.59 (*)    Hemoglobin 11.4 (*)    HCT 34.1 (*)    All other components within normal limits  HEPATIC FUNCTION PANEL  LIPASE, BLOOD  D-DIMER, QUANTITATIVE  BRAIN NATRIURETIC PEPTIDE  TROPONIN I (HIGH SENSITIVITY)  TROPONIN I (HIGH SENSITIVITY)   ____________________________________________  EKG  ED ECG REPORT I, Aramis Zobel J, the attending physician, personally viewed and interpreted this ECG.   Date: 01/10/2021  EKG Time: 2138  Rate: 94  Rhythm: normal sinus rhythm  Axis: RAD  Intervals:right bundle branch block  ST&T Change: Nonspecific  ____________________________________________  RADIOLOGY Cecilie Kicks J, personally viewed and evaluated these images (plain radiographs) as part of my medical decision making, as well as reviewing the written report by the radiologist.  ED MD interpretation: No acute cardiopulmonary process  Official radiology report(s): DG Chest 2 View  Result Date: 01/09/2021 CLINICAL DATA:  Chest pain EXAM: CHEST - 2 VIEW COMPARISON:  08/22/2013 FINDINGS: The heart size and mediastinal contours are within normal limits. Both lungs are clear. The visualized skeletal structures are unremarkable. IMPRESSION: No active cardiopulmonary disease. Electronically Signed   By: Donavan Foil M.D.   On: 01/09/2021 21:57    ____________________________________________   PROCEDURES  Procedure(s) performed (including Critical Care):  Procedures   ____________________________________________   INITIAL IMPRESSION / ASSESSMENT AND PLAN / ED COURSE  As part of my medical decision making, I reviewed the following data within the [place] notes reviewed and incorporated, Labs reviewed, EKG interpreted, Old chart  reviewed, Radiograph reviewed, and Notes from prior ED visits     75 year old female presenting with chest pain. Differential diagnosis includes, but is not limited to, ACS, aortic dissection, pulmonary embolism, cardiac tamponade, pneumothorax, pneumonia, pericarditis, myocarditis, GI-related causes including esophagitis/gastritis, and musculoskeletal chest wall pain.     Atypical chest pain as pain only comes on with movement and patient describes it as a burning sensation.  Will add LFTs/lipase, repeat troponin.  Check D-dimer given recent COVID infection.  Will administer IV Pepcid and reassess.  Clinical  Course as of 01/10/21 0410  Thu Jan 10, 2021  0308 Patient ambulated without tachycardia nor hypoxia.  Overall feels fine.  States PCP started Protonix just yesterday.  Has cardiology appointment Friday morning.  Strict return precautions given.  Patient and spouse verbalized understanding agree with plan of care. [JS]    Clinical Course User Index [JS] Paulette Blanch, MD     ____________________________________________   FINAL CLINICAL IMPRESSION(S) / ED DIAGNOSES  Final diagnoses:  Nonspecific chest pain     ED Discharge Orders     None        Note:  This document was prepared using Dragon voice recognition software and may include unintentional dictation errors.    Paulette Blanch, MD 01/10/21 [phone]

## 2021-01-10 NOTE — ED Notes (Signed)
Patient Ambulated with pulse oximetry: HR 74-101 bpm; O2 Sat 93-96% Room Air

## 2021-01-10 NOTE — Discharge Instructions (Addendum)
Take Protonix as directed by your doctor.  Return to the ER for worsening symptoms, persistent vomiting, difficulty breathing or other concerns.

## 2021-01-30 ENCOUNTER — Encounter: Payer: Self-pay | Admitting: Cardiology

## 2021-01-30 ENCOUNTER — Encounter
Admission: RE | Disposition: A | Discharge status: Home / Self Care | Payer: Self-pay | Source: Home / Self Care | Attending: Cardiology

## 2021-01-30 ENCOUNTER — Other Ambulatory Visit: Payer: Self-pay

## 2021-01-30 ENCOUNTER — Ambulatory Visit
Admission: RE | Admit: 2021-01-30 | Discharge: 2021-01-31 | Disposition: A | Discharge status: Home / Self Care | Payer: Medicare Other | Attending: Cardiology | Admitting: Cardiology

## 2021-01-30 DIAGNOSIS — Z7982 Long term (current) use of aspirin: Secondary | ICD-10-CM | POA: Insufficient documentation

## 2021-01-30 DIAGNOSIS — Z87891 Personal history of nicotine dependence: Secondary | ICD-10-CM | POA: Diagnosis not present

## 2021-01-30 DIAGNOSIS — I200 Unstable angina: Secondary | ICD-10-CM | POA: Diagnosis present

## 2021-01-30 DIAGNOSIS — I25110 Atherosclerotic heart disease of native coronary artery with unstable angina pectoris: Secondary | ICD-10-CM | POA: Insufficient documentation

## 2021-01-30 DIAGNOSIS — Z7984 Long term (current) use of oral hypoglycemic drugs: Secondary | ICD-10-CM | POA: Insufficient documentation

## 2021-01-30 DIAGNOSIS — E039 Hypothyroidism, unspecified: Secondary | ICD-10-CM | POA: Insufficient documentation

## 2021-01-30 DIAGNOSIS — R9430 Abnormal result of cardiovascular function study, unspecified: Secondary | ICD-10-CM

## 2021-01-30 DIAGNOSIS — E7800 Pure hypercholesterolemia, unspecified: Secondary | ICD-10-CM | POA: Diagnosis not present

## 2021-01-30 DIAGNOSIS — Z79890 Hormone replacement therapy (postmenopausal): Secondary | ICD-10-CM | POA: Diagnosis not present

## 2021-01-30 DIAGNOSIS — Z79899 Other long term (current) drug therapy: Secondary | ICD-10-CM | POA: Insufficient documentation

## 2021-01-30 DIAGNOSIS — E119 Type 2 diabetes mellitus without complications: Secondary | ICD-10-CM | POA: Insufficient documentation

## 2021-01-30 HISTORY — PX: LEFT HEART CATH AND CORONARY ANGIOGRAPHY: CATH118249

## 2021-01-30 HISTORY — PX: CORONARY STENT INTERVENTION: CATH118234

## 2021-01-30 LAB — GLUCOSE, CAPILLARY
Glucose-Capillary: 164 mg/dL — ABNORMAL HIGH (ref 70–99)
Glucose-Capillary: 217 mg/dL — ABNORMAL HIGH (ref 70–99)
Glucose-Capillary: 175 mg/dL — ABNORMAL HIGH (ref 70–99)
Glucose-Capillary: 240 mg/dL — ABNORMAL HIGH (ref 70–99)

## 2021-01-30 LAB — POCT ACTIVATED CLOTTING TIME
Activated Clotting Time: 213 seconds
Activated Clotting Time: 318 seconds

## 2021-01-30 SURGERY — LEFT HEART CATH AND CORONARY ANGIOGRAPHY
Anesthesia: Moderate Sedation

## 2021-01-30 MED ORDER — ASPIRIN 81 MG PO CHEW
CHEWABLE_TABLET | ORAL | Status: AC
  Filled 2021-01-30: qty 4

## 2021-01-30 MED ORDER — METOPROLOL SUCCINATE ER 50 MG PO TB24
25.0000 mg | ORAL_TABLET | Freq: Every day | ORAL | Status: DC
  Administered 2021-01-31: 25 mg via ORAL
  Filled 2021-01-30: qty 1

## 2021-01-30 MED ORDER — SODIUM CHLORIDE 0.9 % IV SOLN: 250.0000 mL | INTRAVENOUS | Status: DC | PRN

## 2021-01-30 MED ORDER — CLOPIDOGREL BISULFATE 75 MG PO TABS
ORAL_TABLET | ORAL | Status: DC | PRN
  Administered 2021-01-30: 600 mg via ORAL

## 2021-01-30 MED ORDER — ACETAMINOPHEN 325 MG PO TABS: 650.0000 mg | ORAL_TABLET | ORAL | Status: DC | PRN

## 2021-01-30 MED ORDER — SODIUM CHLORIDE 0.9 % IV SOLN: INTRAVENOUS | Status: DC

## 2021-01-30 MED ORDER — VERAPAMIL HCL 2.5 MG/ML IV SOLN
INTRAVENOUS | Status: DC | PRN
  Administered 2021-01-30: 2.5 mg via INTRA_ARTERIAL

## 2021-01-30 MED ORDER — LIDOCAINE HCL 1 % IJ SOLN
INTRAMUSCULAR | Status: AC
  Filled 2021-01-30: qty 20

## 2021-01-30 MED ORDER — FENTANYL CITRATE (PF) 100 MCG/2ML IJ SOLN
INTRAMUSCULAR | Status: AC
  Filled 2021-01-30: qty 2

## 2021-01-30 MED ORDER — HEPARIN (PORCINE) IN NACL 1000-0.9 UT/500ML-% IV SOLN
INTRAVENOUS | Status: AC
  Filled 2021-01-30: qty 1000

## 2021-01-30 MED ORDER — PANTOPRAZOLE SODIUM 40 MG PO TBEC
40.0000 mg | DELAYED_RELEASE_TABLET | Freq: Every day | ORAL | Status: DC
  Administered 2021-01-30 – 2021-01-31 (×2): 40 mg via ORAL
  Filled 2021-01-30 (×2): qty 1

## 2021-01-30 MED ORDER — ASPIRIN 81 MG PO CHEW
324.0000 mg | CHEWABLE_TABLET | ORAL | Status: AC
  Administered 2021-01-30: 324 mg via ORAL

## 2021-01-30 MED ORDER — HEPARIN SODIUM (PORCINE) 1000 UNIT/ML IJ SOLN
INTRAMUSCULAR | Status: DC | PRN
  Administered 2021-01-30 (×2): 3500 [IU] via INTRAVENOUS
  Administered 2021-01-30: 3000 [IU] via INTRAVENOUS

## 2021-01-30 MED ORDER — SODIUM CHLORIDE 0.9% FLUSH: 3.0000 mL | INTRAVENOUS | Status: DC | PRN

## 2021-01-30 MED ORDER — FLUTICASONE PROPIONATE 50 MCG/ACT NA SUSP
2.0000 | Freq: Every day | NASAL | Status: DC
  Administered 2021-01-31: 2 via NASAL
  Filled 2021-01-30: qty 16

## 2021-01-30 MED ORDER — ASPIRIN EC 81 MG PO TBEC
81.0000 mg | DELAYED_RELEASE_TABLET | Freq: Every day | ORAL | Status: DC
  Administered 2021-01-31: 81 mg via ORAL
  Filled 2021-01-30: qty 1

## 2021-01-30 MED ORDER — IOHEXOL 350 MG/ML SOLN
INTRAVENOUS | Status: DC | PRN
  Administered 2021-01-30: 130 mL

## 2021-01-30 MED ORDER — CLOPIDOGREL BISULFATE 75 MG PO TABS: 75.0000 mg | ORAL_TABLET | Freq: Every day | ORAL | 11 refills | Status: DC

## 2021-01-30 MED ORDER — INSULIN ASPART 100 UNIT/ML IJ SOLN
0.0000 [IU] | Freq: Three times a day (TID) | INTRAMUSCULAR | Status: DC
  Administered 2021-01-30: 3 [IU] via SUBCUTANEOUS
  Administered 2021-01-30: 2 [IU] via SUBCUTANEOUS
  Administered 2021-01-31: 3 [IU] via SUBCUTANEOUS
  Filled 2021-01-30 (×3): qty 1

## 2021-01-30 MED ORDER — ISOSORBIDE MONONITRATE ER 30 MG PO TB24
30.0000 mg | ORAL_TABLET | Freq: Every day | ORAL | Status: DC
  Administered 2021-01-31: 30 mg via ORAL
  Filled 2021-01-30: qty 1

## 2021-01-30 MED ORDER — FENTANYL CITRATE (PF) 100 MCG/2ML IJ SOLN
INTRAMUSCULAR | Status: DC | PRN
  Administered 2021-01-30 (×2): 12.5 ug via INTRAVENOUS

## 2021-01-30 MED ORDER — ATORVASTATIN CALCIUM 80 MG PO TABS
80.0000 mg | ORAL_TABLET | Freq: Every day | ORAL | Status: DC
  Administered 2021-01-30: 80 mg via ORAL
  Filled 2021-01-30: qty 1

## 2021-01-30 MED ORDER — NITROGLYCERIN 1 MG/10 ML FOR IR/CATH LAB
INTRA_ARTERIAL | Status: AC
  Filled 2021-01-30: qty 10

## 2021-01-30 MED ORDER — ONDANSETRON HCL 4 MG/2ML IJ SOLN: 4.0000 mg | Freq: Four times a day (QID) | INTRAMUSCULAR | Status: DC | PRN

## 2021-01-30 MED ORDER — NITROGLYCERIN 1 MG/10 ML FOR IR/CATH LAB
INTRA_ARTERIAL | Status: DC | PRN
  Administered 2021-01-30 (×2): 200 ug via INTRACORONARY

## 2021-01-30 MED ORDER — LIDOCAINE HCL (PF) 1 % IJ SOLN
INTRAMUSCULAR | Status: DC | PRN
  Administered 2021-01-30: 2 mL

## 2021-01-30 MED ORDER — CLOPIDOGREL BISULFATE 75 MG PO TABS
75.0000 mg | ORAL_TABLET | Freq: Every day | ORAL | Status: DC
  Administered 2021-01-31: 75 mg via ORAL
  Filled 2021-01-30: qty 1

## 2021-01-30 MED ORDER — CLOPIDOGREL BISULFATE 75 MG PO TABS
ORAL_TABLET | ORAL | Status: AC
  Filled 2021-01-30: qty 8

## 2021-01-30 MED ORDER — ATORVASTATIN CALCIUM 80 MG PO TABS: 80.0000 mg | ORAL_TABLET | Freq: Every day | ORAL | 11 refills | Status: AC

## 2021-01-30 MED ORDER — VERAPAMIL HCL 2.5 MG/ML IV SOLN
INTRAVENOUS | Status: AC
  Filled 2021-01-30: qty 2

## 2021-01-30 MED ORDER — SODIUM CHLORIDE 0.9% FLUSH
3.0000 mL | Freq: Two times a day (BID) | INTRAVENOUS | Status: DC
  Administered 2021-01-30: 3 mL via INTRAVENOUS

## 2021-01-30 MED ORDER — LEVOTHYROXINE SODIUM 125 MCG PO TABS
125.0000 ug | ORAL_TABLET | Freq: Every day | ORAL | Status: DC
  Administered 2021-01-31: 125 ug via ORAL
  Filled 2021-01-30: qty 1

## 2021-01-30 MED ORDER — MIDAZOLAM HCL 2 MG/2ML IJ SOLN
INTRAMUSCULAR | Status: DC | PRN
  Administered 2021-01-30: .5 mg via INTRAVENOUS
  Administered 2021-01-30: 1 mg via INTRAVENOUS

## 2021-01-30 MED ORDER — MIDAZOLAM HCL 2 MG/2ML IJ SOLN
INTRAMUSCULAR | Status: AC
  Filled 2021-01-30: qty 2

## 2021-01-30 MED ORDER — SODIUM CHLORIDE 0.9% FLUSH
3.0000 mL | Freq: Two times a day (BID) | INTRAVENOUS | Status: DC
  Administered 2021-01-30 – 2021-01-31 (×2): 3 mL via INTRAVENOUS

## 2021-01-30 MED ORDER — HEPARIN SODIUM (PORCINE) 1000 UNIT/ML IJ SOLN
INTRAMUSCULAR | Status: AC
  Filled 2021-01-30: qty 1

## 2021-01-30 MED ORDER — SODIUM CHLORIDE 0.9 % IV SOLN: INTRAVENOUS | Status: AC

## 2021-01-30 SURGICAL SUPPLY — 22 items
BALLN TREK RX 2.5X12 (BALLOONS) ×2
BALLN ~~LOC~~ TREK RX 3.25X12 (BALLOONS) ×2
BALLOON TREK RX 2.5X12 (BALLOONS) ×1 IMPLANT
BALLOON ~~LOC~~ TREK RX 3.25X12 (BALLOONS) ×1 IMPLANT
CATH EAGLE EYE PLAT IMAGING (CATHETERS) ×2 IMPLANT
CATH INFINITI 5 FR JL3.5 (CATHETERS) ×2 IMPLANT
CATH INFINITI JR4 5F (CATHETERS) ×2 IMPLANT
CATH VISTA GUIDE 6FR JR4 (CATHETERS) ×2 IMPLANT
DEVICE RAD TR BAND REGULAR (VASCULAR PRODUCTS) ×2 IMPLANT
DRAPE BRACHIAL (DRAPES) ×2 IMPLANT
GLIDESHEATH SLEND SS 6F .021 (SHEATH) ×2 IMPLANT
GUIDEWIRE INQWIRE 1.5J.035X260 (WIRE) ×1 IMPLANT
INQWIRE 1.5J .035X260CM (WIRE) ×2
KIT ENCORE 26 ADVANTAGE (KITS) ×2 IMPLANT
KIT SYRINGE INJ CVI SPIKEX1 (MISCELLANEOUS) ×2 IMPLANT
PACK CARDIAC CATH (CUSTOM PROCEDURE TRAY) ×2 IMPLANT
PROTECTION STATION PRESSURIZED (MISCELLANEOUS) ×2
SET ATX SIMPLICITY (MISCELLANEOUS) ×2 IMPLANT
STATION PROTECTION PRESSURIZED (MISCELLANEOUS) ×1 IMPLANT
STENT ONYX FRONTIER 3.0X12 (Permanent Stent) ×4 IMPLANT
TUBING CIL FLEX 10 FLL-RA (TUBING) ×2 IMPLANT
WIRE ASAHI PROWATER 180CM (WIRE) ×2 IMPLANT

## 2021-01-30 NOTE — Progress Notes (Signed)
1 cc air removed without complication.  6 cc remaining in band.

## 2021-01-30 NOTE — Progress Notes (Addendum)
Called to patient room with complaints of swelling in arm. 5 cc of air on it at this time. Bruising and hardness appear to be increasing. CN RN Arlyss Gandy messaged MD Corky Sox to request him to see patient at bedside. Pulse remains strong 2+. Oxygenation at 97% in thumb

## 2021-01-30 NOTE — Progress Notes (Addendum)
This RN assessed patient's arm at TR band site at 1220. Site appeared to be slightly oozing and bruising at proximal side of band, extended slightly beyond marking made at arrival to unit.   Called Cath RN Shirlean Mylar who informed me to take 3cc out of TR, which I removed at that time.   Shirlean Mylar came to bedside where she removed the additional 3cc and held pressure to radial site. MD Corky Sox to bedside at same time and replaced TR band and inflated with 9cc of air.   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   CN RN Roanna Epley at bedside throughout encounter with Cath RN and MD Corky Sox, fully  aware of situation. MD educated myself and RN Ashly to begin the removal process again at 1420. RN Shirlean Mylar educated me to take 1 cc of air out every 15 minutes until deflated. Per MD Corky Sox, If pulsation begins again, replace band, re-inflate and page MD Corky Sox to bedside.   Patient educated of complication at this time and will be requiring overnight stay in hospital. AOx4 at this time. Patient educated to report any feelings of swelling, pain or bleeding at radial site top staff immediately. Patient verbalizes understanding.

## 2021-01-30 NOTE — Progress Notes (Addendum)
Patient arrived to unit with Cath RN Robin. RN Shirlean Mylar noted trickling of blood at time of arrival to unit from site. 6cc air in TR upon arrival. RN Shirlean Mylar attempted to remove 2 cc of air, replaced immediately, due to trickling of blood.   Re assessed by this RN and CN RN Roanna Epley at 1210. Decision made to leave 6cc of air in place at this time due to signs of slight bleed.   Per patient, MD Corky Sox just left bedside,  assessed site. Small bruise, marked on arm below TR upon arrival to unit. All site and complications assessed by MD Corky Sox. Corky Sox will return to bedside after rounding to assess patient and determine if patient is appropriate for overnight or discharge.   Site will be reassessed at 1240 by this RN and CN RN Roanna Epley.

## 2021-01-30 NOTE — Progress Notes (Signed)
1522: 5cc remaining in TR band.   Site appears without complication, scant amount of oozing blood noted. Pulses palpable and cap refill less than 3 seconds.   MD Corky Sox aware of TR band circumstances at this time.   Follow up appointment for next week with MD Corky Sox, made by NS [place].

## 2021-01-30 NOTE — Progress Notes (Addendum)
MD Corky Sox re-inflated to 13 cc at this time. Placing additional band below current band by MD.  Both bands to remain in place for two hours after MD places second band.   Previous deflation schedule: 1430: down 1 to 8 cc 1445: down 1 to 7 cc 1500: down 1 to 6 cc 1515: down 1 to 5 cc 1530: MD Corky Sox to bedside for site assessment and change in plan of care

## 2021-01-30 NOTE — Progress Notes (Addendum)
1740: Deflation started, 1 cc removed from each band.  12 cc remain in 1st band and 9 cc in 2nd band. No complications  6893: 1 cc removed from each band, now 11 cc in 1st and 8 cc in 2nd. No complications   4068: 1 cc removed from each. 10 cc in 1st, 7 cc in 2nd   1834: 1 cc removed from each. 9 cc in 1st, 6 cc in 2nd. No complications.   1847: 1 cc removed from each. 8 cc in 1st, 5 cc in 2nd. No complications.   1902: 1 cc removed from each. 77 cc in 1st and 4 cc in 2nd remain.   Report given to RN Britt Bolognese on complications of TR band removal. RN aware Nehemiah Massed on call this evening for complications.

## 2021-01-30 NOTE — Progress Notes (Signed)
1 cc air removed without complication. 7 cc remaining.

## 2021-01-30 NOTE — H&P (Signed)
[facility] History and Physical  Patient ID: [name] MRN: [id number] DOB/AGE: [birth year] 73 y.o. Admit date: 01/30/2021  Primary Care Physician: Tracie Harrier, MD Primary Cardiologist Andrez Grime, MD   HPI:  [name] is a 75 year old female with a history of type 2 diabetes, hyperlipidemia, hypothyroidism who was recently evaluated in cardiology clinic for evaluation of chest pain.  She describes typical angina with exertion that she initially thought was reflux.  This happens every time she exerts herself heavily and is associated with mild shortness of breath.  The pain resolves with rest.  She underwent a nuclear medicine stress test which showed a moderate area of inferior ischemia.  Echocardiogram was also completed and was normal.  She has been started on antianginal medications but still has limitation in her daily activities.  We will proceed with coronary angiography for further evaluation.    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, right eyebrow   Diabetes mellitus without complication ([place])    Hypercholesteremia    Hypothyroidism     Past Surgical History:  Procedure Laterality Date   ABDOMINAL HYSTERECTOMY     CATARACT EXTRACTION W/PHACO Left 11/04/2016   Procedure: CATARACT EXTRACTION PHACO AND INTRAOCULAR LENS PLACEMENT ([place]);  Surgeon: Birder Robson, MD;  Location: [facility] ORS;  Service: Ophthalmology;  Laterality: Left;  [place] 00:24.5 AP% 13.5 CDE 3.30 Fluid Pack lot # [id number]   CATARACT EXTRACTION W/PHACO Right 11/25/2016   Procedure: CATARACT EXTRACTION PHACO AND INTRAOCULAR LENS PLACEMENT ([facility]);  Surgeon: Birder Robson, MD;  Location: [facility] ORS;  Service: Ophthalmology;  Laterality: Right;  [place] 00:32 AP% 15.7 CDE 5.00 Fluid pack lot # [id number]   CTR     release of tendon only    Medications Prior to Admission   Medication Sig Dispense Refill Last Dose   acetaminophen (TYLENOL) 500 MG tablet Take 500 mg by mouth 2 (two) times daily as needed for mild pain.   01/29/2021   aspirin EC 81 MG tablet Take 81 mg by mouth daily with supper.   01/29/2021   Calcium Carbonate-Vitamin D (CALCIUM 600+D PO) Take 1 tablet by mouth 2 (two) times daily with a meal.   01/29/2021   Cholecalciferol (VITAMIN D) 50 MCG (2000 UT) CAPS Take 2,000 Units by mouth daily with lunch.   01/29/2021   clotrimazole-betamethasone (LOTRISONE) cream Apply 1 application topically daily as needed for rash.   Past Week   fenofibrate (TRICOR) 48 MG tablet Take 48 mg by mouth daily with lunch.   01/29/2021   ferrous sulfate 325 (65 FE) MG tablet Take 325 mg by mouth daily with supper.   01/29/2021   fluticasone (FLONASE) 50 MCG/ACT nasal spray Place 2 sprays into both nostrils daily.   01/30/2021   glipiZIDE (GLUCOTROL) 10 MG tablet Take 10 mg by mouth 2 (two) times daily before a meal.   01/29/2021   isosorbide mononitrate (IMDUR) 30 MG 24 hr tablet Take 30 mg by mouth daily.   01/29/2021   levothyroxine (SYNTHROID, LEVOTHROID) 125 MCG tablet Take 125 mcg by mouth daily before breakfast.   01/30/2021   levothyroxine (SYNTHROID, LEVOTHROID) 25 MCG tablet Take 25 mcg by mouth every Saturday. For a total of 154mcg on Saturdays.      lovastatin (MEVACOR) 40 MG tablet Take 40 mg by mouth daily with supper.   01/29/2021  magnesium oxide (MAG-OX) 400 MG tablet Take 400 mg by mouth daily with breakfast.   01/29/2021   metFORMIN (GLUCOPHAGE) 500 MG tablet Take 1,000 mg by mouth 2 (two) times daily with a meal.   Past Week   metoprolol succinate (TOPROL-XL) 25 MG 24 hr tablet Take 25 mg by mouth daily.   01/29/2021   Multiple Vitamin (MULTIVITAMIN WITH MINERALS) TABS tablet Take 1 tablet by mouth daily with supper.   01/29/2021   Multiple Vitamins-Minerals (HAIR SKIN AND NAILS FORMULA) TABS Take 1 tablet by mouth daily with supper.   01/29/2021    Multiple Vitamins-Minerals (ICAPS PO) Take 1 tablet by mouth daily with breakfast.   01/29/2021   Omega-3 Fatty Acids (FISH OIL) 1000 MG CAPS Take 1,000 mg by mouth daily with breakfast.   01/29/2021   pantoprazole (PROTONIX) 40 MG tablet Take 40 mg by mouth daily.   01/29/2021   Tetrahydrozoline HCl (VISINE OP) Place 1 drop into both eyes daily as needed (eye irritation).   Past Week   vitamin B-12 (CYANOCOBALAMIN) 500 MCG tablet Take 500 mcg by mouth daily with breakfast.   01/29/2021   vitamin C (ASCORBIC ACID) 500 MG tablet Take 500 mg by mouth daily with lunch.   01/29/2021   Social History   Socioeconomic History   Marital status: Married    Spouse name: Not on file   Number of children: Not on file   Years of education: Not on file   Highest education level: Not on file  Occupational History   Not on file  Tobacco Use   Smoking status: Former    Packs/day: 0.50    Years: 10.00    Pack years: 5.00    Types: Cigarettes    Quit date: 04/14/1998    Years since quitting: 22.8   Smokeless tobacco: Never  Vaping Use   Vaping Use: Never used  Substance and Sexual Activity   Alcohol use: No   Drug use: Not on file   Sexual activity: Never  Other Topics Concern   Not on file  Social History Narrative   Not on file   Social Determinants of Health   Financial Resource Strain: Not on file  Food Insecurity: Not on file  Transportation Needs: Not on file  Physical Activity: Not on file  Stress: Not on file  Social Connections: Not on file  Intimate Partner Violence: Not on file    Family History  Problem Relation Age of Onset   Cancer Mother 44       cervical   Breast cancer Neg Hx       Review of systems complete and found to be negative unless listed above    Physical Exam:  General: Well developed, well nourished, in no acute distress HEENT:  Normocephalic and atramatic Neck:  No JVD.  Lungs: Clear bilaterally to auscultation and percussion. Heart: HRRR .  Normal S1 and S2 without gallops or murmurs.  Abdomen: Bowel sounds are positive, abdomen soft and non-tender  Msk:  Back normal, normal gait. Normal strength and tone for age. Extremities: No clubbing, cyanosis or edema.   Neuro: Alert and oriented X 3. Psych:  Good affect, responds appropriately   Labs:   Lab Results  Component Value Date   WBC 8.6 01/09/2021   HGB 11.4 (L) 01/09/2021   HCT 34.1 (L) 01/09/2021   MCV 95.0 01/09/2021   PLT 294 01/09/2021   No results for input(s): NA, K, CL, CO2, BUN, CREATININE, CALCIUM, PROT,  BILITOT, ALKPHOS, ALT, AST, GLUCOSE in the last 168 hours.  Invalid input(s): LABALBU Lab Results  Component Value Date   TROPONINI < 0.02 08/22/2013   No results found for: CHOL No results found for: HDL No results found for: LDLCALC No results found for: TRIG No results found for: CHOLHDL No results found for: LDLDIRECT    Radiology: DG Chest 2 View  Result Date: 01/09/2021 CLINICAL DATA:  Chest pain EXAM: CHEST - 2 VIEW COMPARISON:  08/22/2013 FINDINGS: The heart size and mediastinal contours are within normal limits. Both lungs are clear. The visualized skeletal structures are unremarkable. IMPRESSION: No active cardiopulmonary disease. Electronically Signed   By: Donavan Foil M.D.   On: 01/09/2021 21:57    EKG: Normal sinus rhythm.  Right bundle branch block.  ASSESSMENT AND PLAN:  75 year old female with multiple risk factors for coronary disease who was been experiencing typical angina.  She has a positive stress test which shows inferior ischemia with exertion.  She has been on antianginal therapies with metoprolol and Imdur and despite this has continued limitations in her daily activities.  We will proceed with coronary angiography and possible intervention for further evaluation.  The risks and benefits were discussed at length with the patient and her husband and they were agreeable to proceed.  Signed: Andrez Grime MD 01/30/2021,  7:44 AM

## 2021-01-30 NOTE — Progress Notes (Signed)
Second band placed by MD at 1540. Filled with 10cc  To start deflating at 1740. Take SAME AMOUNT OF AIR OUT OF EACH BAND AT THE SAME TIME.   Original band filled to 13 cc Second band filled to 10 cc.

## 2021-01-30 NOTE — Progress Notes (Addendum)
Corky Sox requested Nehemiah Massed to be notified tonight if further complications with TR band. This RN will update night shift RN and note left at CN computer with this information.

## 2021-01-30 NOTE — Progress Notes (Signed)
MD Corky Sox notified this RN and CN Arlyss Gandy to start air removal at 1430  1430: 1 cc removed; 8 cc  remaining; site assessed with no complications

## 2021-01-31 DIAGNOSIS — I25110 Atherosclerotic heart disease of native coronary artery with unstable angina pectoris: Secondary | ICD-10-CM | POA: Diagnosis not present

## 2021-01-31 LAB — CBC
HCT: 36.3 % (ref 36.0–46.0)
Hemoglobin: 11.8 g/dL — ABNORMAL LOW (ref 12.0–15.0)
MCH: 30.1 pg (ref 26.0–34.0)
MCHC: 32.5 g/dL (ref 30.0–36.0)
MCV: 92.6 fL (ref 80.0–100.0)
Platelets: 343 10*3/uL (ref 150–400)
RBC: 3.92 MIL/uL (ref 3.87–5.11)
RDW: 13.3 % (ref 11.5–15.5)
WBC: 8.1 10*3/uL (ref 4.0–10.5)
nRBC: 0 % (ref 0.0–0.2)

## 2021-01-31 LAB — BASIC METABOLIC PANEL
Anion gap: 9 (ref 5–15)
BUN: 26 mg/dL — ABNORMAL HIGH (ref 8–23)
CO2: 24 mmol/L (ref 22–32)
Calcium: 9.3 mg/dL (ref 8.9–10.3)
Chloride: 104 mmol/L (ref 98–111)
Creatinine, Ser: 1.13 mg/dL — ABNORMAL HIGH (ref 0.44–1.00)
GFR, Estimated: 51 mL/min — ABNORMAL LOW (ref >60)
Glucose, Bld: 187 mg/dL — ABNORMAL HIGH (ref 70–99)
Potassium: 4.1 mmol/L (ref 3.5–5.1)
Sodium: 137 mmol/L (ref 135–145)

## 2021-01-31 LAB — GLUCOSE, CAPILLARY: Glucose-Capillary: 214 mg/dL — ABNORMAL HIGH (ref 70–99)

## 2021-01-31 NOTE — Discharge Summary (Signed)
Physician Discharge Summary  Patient ID: [name] MRN: [id number] DOB/AGE: 1945/10/17 75 y.o.  Admit date: 01/30/2021 Discharge date: 01/31/2021  Primary Discharge Diagnosis unstable angina Secondary Discharge Diagnosis coronary artery disease  Significant Diagnostic Studies: yes  Consults: None  Hospital Course: The patient underwent elective cardiac catheterization on 01/30/2021 which revealed near one-vessel coronary artery disease with 90% stenosis proximal RCA.  Patient underwent IVUS guided PCI of proximal RCA with overlapping 3.0 x 12 mm Onyx Frontier drug-eluting stents with an excellent angiographic result.  Patient was observed on the progressive care unit where she developed a small hematoma side of the ER band was resolved after reapplication of a TR band.  On the morning of 01/31/2021, the patient was doing well, not residual hematoma at the site a TR band, ambulating without difficulty, and was discharged home in stable condition.   Discharge Exam: Blood pressure 136/63, pulse 63, temperature 98.5 F (36.9 C), temperature source Oral, resp. rate 18, height 5\' 3"  (1.6 m), weight 65.8 kg, SpO2 98 %.   General appearance: alert Head: Normocephalic, without obvious abnormality, atraumatic Eyes: conjunctivae/corneas clear. PERRL, EOM's intact. Fundi benign. Ears: normal TM's and external ear canals both ears Nose: Nares normal. Septum midline. Mucosa normal. No drainage or sinus tenderness. Throat: lips, mucosa, and tongue normal; teeth and gums normal Neck: no adenopathy, no carotid bruit, no JVD, supple, symmetrical, trachea midline, and thyroid not enlarged, symmetric, no tenderness/mass/nodules Back: symmetric, no curvature. ROM normal. No CVA tenderness. Resp: clear to auscultation bilaterally Chest wall: no tenderness Breasts: normal appearance, no masses or tenderness Cardio: regular rate and rhythm, S1, S2 normal, no murmur, click, rub or gallop GI: soft,  non-tender; bowel sounds normal; no masses,  no organomegaly Extremities: extremities normal, atraumatic, no cyanosis or edema Pulses: 2+ and symmetric Skin: Skin color, texture, turgor normal. No rashes or lesions Lymph nodes: Cervical, supraclavicular, and axillary nodes normal. Neurologic: Grossly normal Incision/Wound: Well-healing with slight bruising Labs:   Lab Results  Component Value Date   WBC 8.1 01/31/2021   HGB 11.8 (L) 01/31/2021   HCT 36.3 01/31/2021   MCV 92.6 01/31/2021   PLT 343 01/31/2021   No results for input(s): NA, K, CL, CO2, BUN, CREATININE, CALCIUM, PROT, BILITOT, ALKPHOS, ALT, AST, GLUCOSE in the last 168 hours.  Invalid input(s): LABALBU    Radiology:  EKG: Sinus rhythm, at 61 bpm, with right bundle branch block  FOLLOW UP PLANS AND APPOINTMENTS Discharge Instructions     AMB Referral to Cardiac Rehabilitation - Phase II   Complete by: As directed    Diagnosis: Coronary Stents   After initial evaluation and assessments completed: Virtual Based Care may be provided alone or in conjunction with Phase 2 Cardiac Rehab based on patient barriers.: Yes      Allergies as of 01/31/2021       Reactions   Ibuprofen Shortness Of Breath   Other reaction(s): Other (See Comments) "feels like an elephant sitting on chest"   Propoxyphene Nausea And Vomiting   Tramadol Nausea And Vomiting   Ciprofloxacin Hives, Itching   Oxycodone Nausea And Vomiting   Pseudoephedrine Other (See Comments)   Insomnia   Hydrocodone Nausea And Vomiting        Medication List     STOP taking these medications    lovastatin 40 MG tablet Commonly known as: MEVACOR       TAKE these medications    acetaminophen 500 MG tablet Commonly known as: TYLENOL Take 500  mg by mouth 2 (two) times daily as needed for mild pain.   aspirin EC 81 MG tablet Take 81 mg by mouth daily with supper.   atorvastatin 80 MG tablet Commonly known as: Lipitor Take 1 tablet (80 mg  total) by mouth daily.   CALCIUM 600+D PO Take 1 tablet by mouth 2 (two) times daily with a meal.   clopidogrel 75 MG tablet Commonly known as: PLAVIX Take 1 tablet (75 mg total) by mouth daily with breakfast.   clotrimazole-betamethasone cream Commonly known as: LOTRISONE Apply 1 application topically daily as needed for rash.   fenofibrate 48 MG tablet Commonly known as: TRICOR Take 48 mg by mouth daily with lunch.   ferrous sulfate 325 (65 FE) MG tablet Take 325 mg by mouth daily with supper.   Fish Oil 1000 MG Caps Take 1,000 mg by mouth daily with breakfast.   fluticasone 50 MCG/ACT nasal spray Commonly known as: FLONASE Place 2 sprays into both nostrils daily.   glipiZIDE 10 MG tablet Commonly known as: GLUCOTROL Take 10 mg by mouth 2 (two) times daily before a meal.   Hair Skin and Nails Formula Tabs Take 1 tablet by mouth daily with supper.   ICAPS PO Take 1 tablet by mouth daily with breakfast.   isosorbide mononitrate 30 MG 24 hr tablet Commonly known as: IMDUR Take 30 mg by mouth daily.   levothyroxine 125 MCG tablet Commonly known as: SYNTHROID Take 125 mcg by mouth daily before breakfast.   levothyroxine 25 MCG tablet Commonly known as: SYNTHROID Take 25 mcg by mouth every Saturday. For a total of 143mcg on Saturdays.   magnesium oxide 400 MG tablet Commonly known as: MAG-OX Take 400 mg by mouth daily with breakfast.   metFORMIN 500 MG tablet Commonly known as: GLUCOPHAGE Take 1,000 mg by mouth 2 (two) times daily with a meal.   metoprolol succinate 25 MG 24 hr tablet Commonly known as: TOPROL-XL Take 25 mg by mouth daily.   multivitamin with minerals Tabs tablet Take 1 tablet by mouth daily with supper.   pantoprazole 40 MG tablet Commonly known as: PROTONIX Take 40 mg by mouth daily.   VISINE OP Place 1 drop into both eyes daily as needed (eye irritation).   vitamin B-12 500 MCG tablet Commonly known as: CYANOCOBALAMIN Take 500  mcg by mouth daily with breakfast.   vitamin C 500 MG tablet Commonly known as: ASCORBIC ACID Take 500 mg by mouth daily with lunch.   Vitamin D 50 MCG (2000 UT) Caps Take 2,000 Units by mouth daily with lunch.        Follow-up Information     Andrez Grime, MD Follow up on 02/06/2021.   Specialty: Cardiology Why: @ 10:45am Contact information: [address] [id number] [place] TO FOLLOW UP APPOINTMENTS  Time spent with patient to include physician time: 25 minutes Signed:  Isaias Cowman MD, PhD, [facility] 01/31/2021, 7:43 AM

## 2021-01-31 NOTE — Progress Notes (Signed)
Discharge instructions explained to pt and pts spouse/ verbalized an understanding/ iv and tele removed/ ambulated around nursing station / tolerated well / transported off unit via wheelchair.

## 2021-02-06 ENCOUNTER — Other Ambulatory Visit (INDEPENDENT_AMBULATORY_CARE_PROVIDER_SITE_OTHER): Payer: Medicare Other

## 2021-02-06 ENCOUNTER — Other Ambulatory Visit: Payer: Self-pay

## 2021-02-06 ENCOUNTER — Other Ambulatory Visit (INDEPENDENT_AMBULATORY_CARE_PROVIDER_SITE_OTHER): Payer: Self-pay | Admitting: Cardiology

## 2021-02-06 DIAGNOSIS — I729 Aneurysm of unspecified site: Secondary | ICD-10-CM

## 2021-02-14 ENCOUNTER — Other Ambulatory Visit: Payer: Self-pay

## 2021-02-14 ENCOUNTER — Encounter: Payer: Medicare Other | Attending: Cardiology

## 2021-02-14 DIAGNOSIS — Z955 Presence of coronary angioplasty implant and graft: Secondary | ICD-10-CM

## 2021-02-14 DIAGNOSIS — Z87891 Personal history of nicotine dependence: Secondary | ICD-10-CM | POA: Insufficient documentation

## 2021-02-14 DIAGNOSIS — E118 Type 2 diabetes mellitus with unspecified complications: Secondary | ICD-10-CM | POA: Insufficient documentation

## 2021-02-14 NOTE — Progress Notes (Signed)
Virtual Visit completed. Patient informed on EP and RD appointment and 6 Minute walk test. Patient also informed of patient health questionnaires on My Chart. Patient Verbalizes understanding. Visit diagnosis can be found in [facility] 01/30/2021.

## 2021-02-28 ENCOUNTER — Encounter: Payer: Medicare Other | Admitting: *Deleted

## 2021-02-28 ENCOUNTER — Other Ambulatory Visit: Payer: Self-pay

## 2021-02-28 VITALS — Ht 64.4 in | Wt 147.2 lb

## 2021-02-28 DIAGNOSIS — E118 Type 2 diabetes mellitus with unspecified complications: Secondary | ICD-10-CM | POA: Diagnosis not present

## 2021-02-28 DIAGNOSIS — Z87891 Personal history of nicotine dependence: Secondary | ICD-10-CM | POA: Diagnosis not present

## 2021-02-28 DIAGNOSIS — Z955 Presence of coronary angioplasty implant and graft: Secondary | ICD-10-CM

## 2021-02-28 NOTE — Progress Notes (Signed)
Cardiac Individual Treatment Plan  Patient Details  Name: [name] MRN: [id number] Date of Birth: [birth year] Referring Provider:   Flowsheet Row Cardiac Rehab from 02/28/2021 in [facility] Cardiac and Pulmonary Rehab  Referring Provider Donnelly Angelica MD       Initial Encounter Date:  Flowsheet Row Cardiac Rehab from 02/28/2021 in [facility] Cardiac and Pulmonary Rehab  Date 02/28/21       Visit Diagnosis: Status post coronary artery stent placement  Patient's Home Medications on Admission:  Current Outpatient Medications:    acetaminophen (TYLENOL) 500 MG tablet, Take 500 mg by mouth 2 (two) times daily as needed for mild pain., Disp: , Rfl:    aspirin EC 81 MG tablet, Take 81 mg by mouth daily with supper., Disp: , Rfl:    atorvastatin (LIPITOR) 80 MG tablet, Take 1 tablet (80 mg total) by mouth daily., Disp: 30 tablet, Rfl: 11   Calcium Carbonate-Vitamin D (CALCIUM 600+D PO), Take 1 tablet by mouth 2 (two) times daily with a meal., Disp: , Rfl:    Cholecalciferol (VITAMIN D) 50 MCG (2000 UT) CAPS, Take 2,000 Units by mouth daily with lunch., Disp: , Rfl:    clopidogrel (PLAVIX) 75 MG tablet, Take 1 tablet (75 mg total) by mouth daily with breakfast., Disp: 30 tablet, Rfl: 11   clopidogrel (PLAVIX) 75 MG tablet, Take by mouth. (Patient not taking: Reported on 02/14/2021), Disp: , Rfl:    clotrimazole-betamethasone (LOTRISONE) cream, Apply 1 application topically daily as needed for rash., Disp: , Rfl:    fenofibrate (TRICOR) 48 MG tablet, Take 48 mg by mouth daily with lunch., Disp: , Rfl:    ferrous sulfate 325 (65 FE) MG tablet, Take 325 mg by mouth daily with supper. (Patient not taking: Reported on 02/14/2021), Disp: , Rfl:    ferrous sulfate 325 (65 FE) MG tablet, Take 1 tablet by mouth daily., Disp: , Rfl:    fluticasone (FLONASE) 50 MCG/ACT nasal spray, Place 2 sprays into both nostrils daily., Disp: , Rfl:    glipiZIDE (GLUCOTROL) 10 MG tablet, Take 10 mg by mouth 2 (two) times  daily before a meal., Disp: , Rfl:    isosorbide mononitrate (IMDUR) 30 MG 24 hr tablet, Take 30 mg by mouth daily. (Patient not taking: Reported on 02/14/2021), Disp: , Rfl:    levothyroxine (SYNTHROID, LEVOTHROID) 125 MCG tablet, Take 125 mcg by mouth daily before breakfast., Disp: , Rfl:    levothyroxine (SYNTHROID, LEVOTHROID) 25 MCG tablet, Take 25 mcg by mouth every Saturday. For a total of 123mcg on Saturdays., Disp: , Rfl:    magnesium oxide (MAG-OX) 400 MG tablet, Take 400 mg by mouth daily with breakfast., Disp: , Rfl:    metFORMIN (GLUCOPHAGE) 500 MG tablet, Take 1,000 mg by mouth 2 (two) times daily with a meal., Disp: , Rfl:    metoprolol succinate (TOPROL-XL) 25 MG 24 hr tablet, Take 25 mg by mouth daily., Disp: , Rfl:    Multiple Vitamin (MULTIVITAMIN WITH MINERALS) TABS tablet, Take 1 tablet by mouth daily with supper., Disp: , Rfl:    Multiple Vitamins-Minerals (HAIR SKIN AND NAILS FORMULA) TABS, Take 1 tablet by mouth daily with supper., Disp: , Rfl:    Multiple Vitamins-Minerals (ICAPS PO), Take 1 tablet by mouth daily with breakfast., Disp: , Rfl:    Omega-3 Fatty Acids (FISH OIL) 1000 MG CAPS, Take 1,000 mg by mouth daily with breakfast., Disp: , Rfl:    pantoprazole (PROTONIX) 40 MG tablet, Take 40 mg by mouth  daily., Disp: , Rfl:    Tetrahydrozoline HCl (VISINE OP), Place 1 drop into both eyes daily as needed (eye irritation)., Disp: , Rfl:    vitamin B-12 (CYANOCOBALAMIN) 500 MCG tablet, Take 500 mcg by mouth daily with breakfast., Disp: , Rfl:    vitamin C (ASCORBIC ACID) 500 MG tablet, Take 500 mg by mouth daily with lunch., Disp: , Rfl:   Past Medical History: Past Medical History:  Diagnosis Date   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      Exercise Target Goals: Exercise Program Goal: Individual exercise prescription set using results from initial 6 min walk test and THRR while considering  patient's activity barriers and safety.   Exercise Prescription Goal: Initial exercise prescription builds to 30-45 minutes a day of aerobic activity, 2-3 days per week.  Home exercise guidelines will be given to patient during program as part of exercise prescription that the participant will acknowledge.   Education: Aerobic Exercise: - Group verbal and visual presentation on the components of exercise prescription. Introduces F.I.T.T principle from ACSM for exercise prescriptions.  Reviews F.I.T.T. principles of aerobic exercise including progression. Written material given at graduation. Flowsheet Row Cardiac Rehab from 02/28/2021 in [facility] Cardiac and Pulmonary Rehab  Education need identified 02/28/21       Education: Resistance Exercise: - Group verbal and visual presentation on the components of exercise prescription. Introduces F.I.T.T principle from ACSM for exercise prescriptions  Reviews F.I.T.T. principles of resistance exercise including progression. Written material given at graduation.    Education: Exercise & Equipment Safety: - Individual verbal instruction and demonstration of equipment use and safety with use of the equipment. Flowsheet Row Cardiac Rehab from 02/28/2021 in [facility] Cardiac and Pulmonary Rehab  Date 02/14/21  Educator [facility]  Instruction Review Code 1- Verbalizes Understanding       Education: Exercise Physiology & General Exercise Guidelines: - Group verbal and written instruction with models to review the exercise physiology of the  cardiovascular system and associated critical values. Provides general exercise guidelines with specific guidelines to those with heart or lung disease.  Flowsheet Row Cardiac Rehab from 02/28/2021 in [facility] Cardiac and Pulmonary Rehab  Education need identified 02/28/21       Education: Flexibility, Balance, Mind/Body Relaxation: - Group verbal and visual presentation with interactive activity on the components of exercise prescription. Introduces F.I.T.T principle from ACSM for exercise prescriptions. Reviews F.I.T.T. principles of flexibility and balance exercise training including progression. Also discusses the mind body connection.  Reviews various relaxation techniques to help reduce and manage stress (i.e. Deep breathing, progressive muscle relaxation, and visualization). Balance handout provided to take home. Written material given at graduation.   Activity Barriers & Risk Stratification:  Activity Barriers & Cardiac Risk Stratification - 02/28/21 1146       Activity Barriers & Cardiac Risk Stratification   Activity Barriers Deconditioning;Muscular Weakness   legs feel weaker  Cardiac Risk Stratification Moderate             6 Minute Walk:  6 Minute Walk     Row Name 02/28/21 1145         6 Minute Walk   Phase Initial     Distance 1188 feet     Walk Time 6 minutes     # of Rest Breaks 0     MPH 2.25     METS 2.57     RPE 8     VO2 Peak 8.98     Symptoms No     Resting HR 58 bpm     Resting BP 136/70     Resting Oxygen Saturation  99 %     Exercise Oxygen Saturation  during 6 min walk 99 %     Max Ex. HR 89 bpm     Max Ex. BP 138/60     2 Minute Post BP 132/64              Oxygen Initial Assessment:   Oxygen Re-Evaluation:   Oxygen Discharge (Final Oxygen Re-Evaluation):   Initial Exercise Prescription:  Initial Exercise Prescription - 02/28/21 1100       Date of Initial Exercise RX and Referring Provider   Date 02/28/21    Referring  Provider Donnelly Angelica MD      Oxygen   Maintain Oxygen Saturation 88% or higher      Treadmill   MPH 2    Grade 1    Minutes 15    METs 2.81      NuStep   Level 1    SPM 80    Minutes 15    METs 2      T5 Nustep   Level 1    SPM 80    Minutes 15    METs 2      Track   Laps 31    Minutes 15    METs 2.69      Prescription Details   Frequency (times per week) 3    Duration Progress to 30 minutes of continuous aerobic without signs/symptoms of physical distress      Intensity   THRR 40-80% of Max Heartrate 112-135    Ratings of Perceived Exertion 11-13    Perceived Dyspnea 0-4      Progression   Progression Continue to progress workloads to maintain intensity without signs/symptoms of physical distress.      Resistance Training   Training Prescription Yes    Weight 3 lb    Reps 10-15             Perform Capillary Blood Glucose checks as needed.  Exercise Prescription Changes:   Exercise Prescription Changes     Row Name 02/28/21 1100             Response to Exercise   Blood Pressure (Admit) 136/70       Blood Pressure (Exercise) 138/60       Blood Pressure (Exit) 132/64       Heart Rate (Admit) 58 bpm       Heart Rate (Exercise) 89 bpm       Heart Rate (Exit) 70 bpm       Oxygen Saturation (Admit) 99 %       Oxygen Saturation (Exercise) 99 %       Rating of Perceived Exertion (Exercise) 8       Symptoms none  Comments walk test results                Exercise Comments:   Exercise Goals and Review:   Exercise Goals     Row Name 02/28/21 1149             Exercise Goals   Increase Physical Activity Yes       Intervention Provide advice, education, support and counseling about physical activity/exercise needs.;Develop an individualized exercise prescription for aerobic and resistive training based on initial evaluation findings, risk stratification, comorbidities and participant's personal goals.       Expected Outcomes  Short Term: Attend rehab on a regular basis to increase amount of physical activity.;Long Term: Add in home exercise to make exercise part of routine and to increase amount of physical activity.;Long Term: Exercising regularly at least 3-5 days a week.       Increase Strength and Stamina Yes       Intervention Provide advice, education, support and counseling about physical activity/exercise needs.;Develop an individualized exercise prescription for aerobic and resistive training based on initial evaluation findings, risk stratification, comorbidities and participant's personal goals.       Expected Outcomes Short Term: Increase workloads from initial exercise prescription for resistance, speed, and METs.;Short Term: Perform resistance training exercises routinely during rehab and add in resistance training at home;Long Term: Improve cardiorespiratory fitness, muscular endurance and strength as measured by increased METs and functional capacity (6MWT)       Able to understand and use rate of perceived exertion (RPE) scale Yes       Intervention Provide education and explanation on how to use RPE scale       Expected Outcomes Short Term: Able to use RPE daily in rehab to express subjective intensity level;Long Term:  Able to use RPE to guide intensity level when exercising independently       Able to understand and use Dyspnea scale Yes       Intervention Provide education and explanation on how to use Dyspnea scale       Expected Outcomes Short Term: Able to use Dyspnea scale daily in rehab to express subjective sense of shortness of breath during exertion;Long Term: Able to use Dyspnea scale to guide intensity level when exercising independently       Knowledge and understanding of Target Heart Rate Range (THRR) Yes       Intervention Provide education and explanation of THRR including how the numbers were predicted and where they are located for reference       Expected Outcomes Short Term: Able to  state/look up THRR;Short Term: Able to use daily as guideline for intensity in rehab;Long Term: Able to use THRR to govern intensity when exercising independently       Able to check pulse independently Yes       Intervention Provide education and demonstration on how to check pulse in carotid and radial arteries.;Review the importance of being able to check your own pulse for safety during independent exercise       Expected Outcomes Short Term: Able to explain why pulse checking is important during independent exercise;Long Term: Able to check pulse independently and accurately       Understanding of Exercise Prescription Yes       Intervention Provide education, explanation, and written materials on patient's individual exercise prescription       Expected Outcomes Short Term: Able to explain program exercise prescription;Long Term: Able to explain home exercise prescription  to exercise independently                Exercise Goals Re-Evaluation :   Discharge Exercise Prescription (Final Exercise Prescription Changes):  Exercise Prescription Changes - 02/28/21 1100       Response to Exercise   Blood Pressure (Admit) 136/70    Blood Pressure (Exercise) 138/60    Blood Pressure (Exit) 132/64    Heart Rate (Admit) 58 bpm    Heart Rate (Exercise) 89 bpm    Heart Rate (Exit) 70 bpm    Oxygen Saturation (Admit) 99 %    Oxygen Saturation (Exercise) 99 %    Rating of Perceived Exertion (Exercise) 8    Symptoms none    Comments walk test results             Nutrition:  Target Goals: Understanding of nutrition guidelines, daily intake of sodium 1500mg , cholesterol 200mg , calories 30% from fat and 7% or less from saturated fats, daily to have 5 or more servings of fruits and vegetables.  Education: All About Nutrition: -Group instruction provided by verbal, written material, interactive activities, discussions, models, and posters to present general guidelines for heart healthy  nutrition including fat, fiber, MyPlate, the role of sodium in heart healthy nutrition, utilization of the nutrition label, and utilization of this knowledge for meal planning. Follow up email sent as well. Written material given at graduation. Flowsheet Row Cardiac Rehab from 02/28/2021 in [facility] Cardiac and Pulmonary Rehab  Education need identified 02/28/21       Biometrics:  Pre Biometrics - 02/28/21 1149       Pre Biometrics   Height 5' 4.4" (1.636 m)    Weight 147 lb 3.2 oz (66.8 kg)    BMI (Calculated) 24.95    Single Leg Stand 30 seconds              Nutrition Therapy Plan and Nutrition Goals:  Nutrition Therapy & Goals - 02/28/21 1150       Intervention Plan   Intervention Prescribe, educate and counsel regarding individualized specific dietary modifications aiming towards targeted core components such as weight, hypertension, lipid management, diabetes, heart failure and other comorbidities.    Expected Outcomes Short Term Goal: Understand basic principles of dietary content, such as calories, fat, sodium, cholesterol and nutrients.;Short Term Goal: A plan has been developed with personal nutrition goals set during dietitian appointment.;Long Term Goal: Adherence to prescribed nutrition plan.             Nutrition Assessments:  MEDIFICTS Score Key: ?70 Need to make dietary changes  40-70 Heart Healthy Diet ? 40 Therapeutic Level Cholesterol Diet  Flowsheet Row Cardiac Rehab from 02/28/2021 in [facility] Cardiac and Pulmonary Rehab  Picture Your Plate Total Score on Admission 69      Picture Your Plate Scores: <29 Unhealthy dietary pattern with much room for improvement. 41-50 Dietary pattern unlikely to meet recommendations for good health and room for improvement. 51-60 More healthful dietary pattern, with some room for improvement.  >60 Healthy dietary pattern, although there may be some specific behaviors that could be improved.    Nutrition Goals  Re-Evaluation:   Nutrition Goals Discharge (Final Nutrition Goals Re-Evaluation):   Psychosocial: Target Goals: Acknowledge presence or absence of significant depression and/or stress, maximize coping skills, provide positive support system. Participant is able to verbalize types and ability to use techniques and skills needed for reducing stress and depression.   Education: Stress, Anxiety, and Depression - Group verbal and visual  presentation to define topics covered.  Reviews how body is impacted by stress, anxiety, and depression.  Also discusses healthy ways to reduce stress and to treat/manage anxiety and depression.  Written material given at graduation.   Education: Sleep Hygiene -Provides group verbal and written instruction about how sleep can affect your health.  Define sleep hygiene, discuss sleep cycles and impact of sleep habits. Review good sleep hygiene tips.    Initial Review & Psychosocial Screening:  Initial Psych Review & Screening - 02/14/21 0948       Initial Review   Current issues with None Identified      Family Dynamics   Good Support System? Yes    Comments Jahnay can look to her son, husband and life group fro support. She has a positive outlook on her health.      Barriers   Psychosocial barriers to participate in program The patient should benefit from training in stress management and relaxation.;There are no identifiable barriers or psychosocial needs.      Screening Interventions   Interventions Encouraged to exercise;To provide support and resources with identified psychosocial needs;Provide feedback about the scores to participant    Expected Outcomes Short Term goal: Utilizing psychosocial counselor, staff and physician to assist with identification of specific Stressors or current issues interfering with healing process. Setting desired goal for each stressor or current issue identified.;Long Term Goal: Stressors or current issues are controlled  or eliminated.;Short Term goal: Identification and review with participant of any Quality of Life or Depression concerns found by scoring the questionnaire.;Long Term goal: The participant improves quality of Life and PHQ9 Scores as seen by post scores and/or verbalization of changes             Quality of Life Scores:   Quality of Life - 02/28/21 1149       Quality of Life   Select Quality of Life      Quality of Life Scores   Health/Function Pre 28.6 %    Socioeconomic Pre 26.43 %    Psych/Spiritual Pre 29.64 %    Family Pre 27.6 %    GLOBAL Pre 28.22 %            Scores of 19 and below usually indicate a poorer quality of life in these areas.  A difference of  2-3 points is a clinically meaningful difference.  A difference of 2-3 points in the total score of the Quality of Life Index has been associated with significant improvement in overall quality of life, self-image, physical symptoms, and general health in studies assessing change in quality of life.  PHQ-9: Recent Review Flowsheet Data     Depression screen [facility] 2/9 02/28/2021   Decreased Interest 0   Down, Depressed, Hopeless 0   PHQ - 2 Score 0   Altered sleeping 0   Tired, decreased energy 0   Change in appetite 0   Feeling bad or failure about yourself  0   Trouble concentrating 0   Moving slowly or fidgety/restless 0   Suicidal thoughts 0   PHQ-9 Score 0   Difficult doing work/chores Not difficult at all      Interpretation of Total Score  Total Score Depression Severity:  1-4 = Minimal depression, 5-9 = Mild depression, 10-14 = Moderate depression, 15-19 = Moderately severe depression, 20-27 = Severe depression   Psychosocial Evaluation and Intervention:  Psychosocial Evaluation - 02/14/21 0950       Psychosocial Evaluation & Interventions  Interventions Encouraged to exercise with the program and follow exercise prescription;Relaxation education;Stress management education    Comments Ermina  can look to her son, husband and life group fro support. She has a positive outlook on her health.    Expected Outcomes Short: Start HeartTrack to help with mood. Long: Maintain a healthy mental state    Continue Psychosocial Services  Follow up required by staff             Psychosocial Re-Evaluation:   Psychosocial Discharge (Final Psychosocial Re-Evaluation):   Vocational Rehabilitation: Provide vocational rehab assistance to qualifying candidates.   Vocational Rehab Evaluation & Intervention:   Education: Education Goals: Education classes will be provided on a variety of topics geared toward better understanding of heart health and risk factor modification. Participant will state understanding/return demonstration of topics presented as noted by education test scores.  Learning Barriers/Preferences:  Learning Barriers/Preferences - 02/14/21 0947       Learning Barriers/Preferences   Learning Barriers None    Learning Preferences None             General Cardiac Education Topics:  AED/CPR: - Group verbal and written instruction with the use of models to demonstrate the basic use of the AED with the basic ABC's of resuscitation.   Anatomy and Cardiac Procedures: - Group verbal and visual presentation and models provide information about basic cardiac anatomy and function. Reviews the testing methods done to diagnose heart disease and the outcomes of the test results. Describes the treatment choices: Medical Management, Angioplasty, or Coronary Bypass Surgery for treating various heart conditions including Myocardial Infarction, Angina, Valve Disease, and Cardiac Arrhythmias.  Written material given at graduation.   Medication Safety: - Group verbal and visual instruction to review commonly prescribed medications for heart and lung disease. Reviews the medication, class of the drug, and side effects. Includes the steps to properly store meds and maintain the  prescription regimen.  Written material given at graduation.   Intimacy: - Group verbal instruction through game format to discuss how heart and lung disease can affect sexual intimacy. Written material given at graduation..   Know Your Numbers and Heart Failure: - Group verbal and visual instruction to discuss disease risk factors for cardiac and pulmonary disease and treatment options.  Reviews associated critical values for Overweight/Obesity, Hypertension, Cholesterol, and Diabetes.  Discusses basics of heart failure: signs/symptoms and treatments.  Introduces Heart Failure Zone chart for action plan for heart failure.  Written material given at graduation. Flowsheet Row Cardiac Rehab from 02/28/2021 in [facility] Cardiac and Pulmonary Rehab  Education need identified 02/28/21       Infection Prevention: - Provides verbal and written material to individual with discussion of infection control including proper hand washing and proper equipment cleaning during exercise session. Flowsheet Row Cardiac Rehab from 02/28/2021 in [facility] Cardiac and Pulmonary Rehab  Date 02/14/21  Educator [facility]  Instruction Review Code 1- Verbalizes Understanding       Falls Prevention: - Provides verbal and written material to individual with discussion of falls prevention and safety. Flowsheet Row Cardiac Rehab from 02/28/2021 in [facility] Cardiac and Pulmonary Rehab  Date 02/14/21  Educator [facility]  Instruction Review Code 1- Verbalizes Understanding       Other: -Provides group and verbal instruction on various topics (see comments)   Knowledge Questionnaire Score:  Knowledge Questionnaire Score - 02/28/21 1150       Knowledge Questionnaire Score   Pre Score 22/26  Core Components/Risk Factors/Patient Goals at Admission:  Personal Goals and Risk Factors at Admission - 02/28/21 1151       Core Components/Risk Factors/Patient Goals on Admission    Weight Management Yes;Weight Loss     Intervention Weight Management: Develop a combined nutrition and exercise program designed to reach desired caloric intake, while maintaining appropriate intake of nutrient and fiber, sodium and fats, and appropriate energy expenditure required for the weight goal.;Weight Management/Obesity: Establish reasonable short term and long term weight goals.;Weight Management: Provide education and appropriate resources to help participant work on and attain dietary goals.    Admit Weight 147 lb 3.2 oz (66.8 kg)    Goal Weight: Short Term 142 lb (64.4 kg)    Goal Weight: Long Term 135 lb (61.2 kg)    Expected Outcomes Short Term: Continue to assess and modify interventions until short term weight is achieved;Long Term: Adherence to nutrition and physical activity/exercise program aimed toward attainment of established weight goal;Weight Loss: Understanding of general recommendations for a balanced deficit meal plan, which promotes 1-2 lb weight loss per week and includes a negative energy balance of [phone] kcal/d;Understanding recommendations for meals to include 15-35% energy as protein, 25-35% energy from fat, 35-60% energy from carbohydrates, less than 200mg  of dietary cholesterol, 20-35 gm of total fiber daily;Understanding of distribution of calorie intake throughout the day with the consumption of 4-5 meals/snacks    Diabetes Yes    Intervention Provide education about signs/symptoms and action to take for hypo/hyperglycemia.;Provide education about proper nutrition, including hydration, and aerobic/resistive exercise prescription along with prescribed medications to achieve blood glucose in normal ranges: Fasting glucose 65-99 mg/dL    Expected Outcomes Short Term: Participant verbalizes understanding of the signs/symptoms and immediate care of hyper/hypoglycemia, proper foot care and importance of medication, aerobic/resistive exercise and nutrition plan for blood glucose control.;Long Term: Attainment of  HbA1C < 7%.    Hypertension Yes    Intervention Provide education on lifestyle modifcations including regular physical activity/exercise, weight management, moderate sodium restriction and increased consumption of fresh fruit, vegetables, and low fat dairy, alcohol moderation, and smoking cessation.;Monitor prescription use compliance.    Expected Outcomes Short Term: Continued assessment and intervention until BP is < 140/32mm HG in hypertensive participants. < 130/40mm HG in hypertensive participants with diabetes, heart failure or chronic kidney disease.;Long Term: Maintenance of blood pressure at goal levels.    Lipids Yes    Intervention Provide education and support for participant on nutrition & aerobic/resistive exercise along with prescribed medications to achieve LDL 70mg , HDL >40mg .    Expected Outcomes Short Term: Participant states understanding of desired cholesterol values and is compliant with medications prescribed. Participant is following exercise prescription and nutrition guidelines.;Long Term: Cholesterol controlled with medications as prescribed, with individualized exercise RX and with personalized nutrition plan. Value goals: LDL < 70mg , HDL > 40 mg.             Education:Diabetes - Individual verbal and written instruction to review signs/symptoms of diabetes, desired ranges of glucose level fasting, after meals and with exercise. Acknowledge that pre and post exercise glucose checks will be done for 3 sessions at entry of program. [place] from 02/28/2021 in [facility] Cardiac and Pulmonary Rehab  Date 02/14/21  Educator [facility]  Instruction Review Code 1- Verbalizes Understanding       Core Components/Risk Factors/Patient Goals Review:    Core Components/Risk Factors/Patient Goals at Discharge (Final Review):    ITP Comments:  ITP Comments  [place] Name 02/14/21 0947 02/28/21 1144         ITP Comments Virtual Visit completed. Patient informed  on EP and RD appointment and 6 Minute walk test. Patient also informed of patient health questionnaires on My Chart. Patient Verbalizes understanding. Visit diagnosis can be found in [facility] 01/30/2021. Completed 6MWT and gym orientation. Initial ITP created and sent for review to Dr. Emily Filbert, Medical Director.               Comments: Initial ITP

## 2021-02-28 NOTE — Patient Instructions (Signed)
Patient Instructions  Patient Details  Name: [name] MRN: [id number] Date of Birth: 1945/05/20 Referring Provider:  Andrez Grime, MD  Below are your personal goals for exercise, nutrition, and risk factors. Our goal is to help you stay on track towards obtaining and maintaining these goals. We will be discussing your progress on these goals with you throughout the program.  Initial Exercise Prescription:  Initial Exercise Prescription - 02/28/21 1100       Date of Initial Exercise RX and Referring Provider   Date 02/28/21    Referring Provider Donnelly Angelica MD      Oxygen   Maintain Oxygen Saturation 88% or higher      Treadmill   MPH 2    Grade 1    Minutes 15    METs 2.81      NuStep   Level 1    SPM 80    Minutes 15    METs 2      T5 Nustep   Level 1    SPM 80    Minutes 15    METs 2      Track   Laps 31    Minutes 15    METs 2.69      Prescription Details   Frequency (times per week) 3    Duration Progress to 30 minutes of continuous aerobic without signs/symptoms of physical distress      Intensity   THRR 40-80% of Max Heartrate 112-135    Ratings of Perceived Exertion 11-13    Perceived Dyspnea 0-4      Progression   Progression Continue to progress workloads to maintain intensity without signs/symptoms of physical distress.      Resistance Training   Training Prescription Yes    Weight 3 lb    Reps 10-15             Exercise Goals: Frequency: Be able to perform aerobic exercise two to three times per week in program working toward 2-5 days per week of home exercise.  Intensity: Work with a perceived exertion of 11 (fairly light) - 15 (hard) while following your exercise prescription.  We will make changes to your prescription with you as you progress through the program.   Duration: Be able to do 30 to 45 minutes of continuous aerobic exercise in addition to a 5 minute warm-up and a 5 minute cool-down routine.   Nutrition  Goals: Your personal nutrition goals will be established when you do your nutrition analysis with the dietician.  The following are general nutrition guidelines to follow: Cholesterol < 200mg /day Sodium < 1500mg /day Fiber: Women over 50 yrs - 21 grams per day  Personal Goals:  Personal Goals and Risk Factors at Admission - 02/28/21 1151       Core Components/Risk Factors/Patient Goals on Admission    Weight Management Yes;Weight Loss    Intervention Weight Management: Develop a combined nutrition and exercise program designed to reach desired caloric intake, while maintaining appropriate intake of nutrient and fiber, sodium and fats, and appropriate energy expenditure required for the weight goal.;Weight Management/Obesity: Establish reasonable short term and long term weight goals.;Weight Management: Provide education and appropriate resources to help participant work on and attain dietary goals.    Admit Weight 147 lb 3.2 oz (66.8 kg)    Goal Weight: Short Term 142 lb (64.4 kg)    Goal Weight: Long Term 135 lb (61.2 kg)    Expected Outcomes Short Term: Continue to assess  and modify interventions until short term weight is achieved;Long Term: Adherence to nutrition and physical activity/exercise program aimed toward attainment of established weight goal;Weight Loss: Understanding of general recommendations for a balanced deficit meal plan, which promotes 1-2 lb weight loss per week and includes a negative energy balance of [phone] kcal/d;Understanding recommendations for meals to include 15-35% energy as protein, 25-35% energy from fat, 35-60% energy from carbohydrates, less than 200mg  of dietary cholesterol, 20-35 gm of total fiber daily;Understanding of distribution of calorie intake throughout the day with the consumption of 4-5 meals/snacks    Diabetes Yes    Intervention Provide education about signs/symptoms and action to take for hypo/hyperglycemia.;Provide education about proper  nutrition, including hydration, and aerobic/resistive exercise prescription along with prescribed medications to achieve blood glucose in normal ranges: Fasting glucose 65-99 mg/dL    Expected Outcomes Short Term: Participant verbalizes understanding of the signs/symptoms and immediate care of hyper/hypoglycemia, proper foot care and importance of medication, aerobic/resistive exercise and nutrition plan for blood glucose control.;Long Term: Attainment of HbA1C < 7%.    Hypertension Yes    Intervention Provide education on lifestyle modifcations including regular physical activity/exercise, weight management, moderate sodium restriction and increased consumption of fresh fruit, vegetables, and low fat dairy, alcohol moderation, and smoking cessation.;Monitor prescription use compliance.    Expected Outcomes Short Term: Continued assessment and intervention until BP is < 140/64mm HG in hypertensive participants. < 130/55mm HG in hypertensive participants with diabetes, heart failure or chronic kidney disease.;Long Term: Maintenance of blood pressure at goal levels.    Lipids Yes    Intervention Provide education and support for participant on nutrition & aerobic/resistive exercise along with prescribed medications to achieve LDL 70mg , HDL >40mg .    Expected Outcomes Short Term: Participant states understanding of desired cholesterol values and is compliant with medications prescribed. Participant is following exercise prescription and nutrition guidelines.;Long Term: Cholesterol controlled with medications as prescribed, with individualized exercise RX and with personalized nutrition plan. Value goals: LDL < 70mg , HDL > 40 mg.             Tobacco Use Initial Evaluation: Social History   Tobacco Use  Smoking Status Former   Packs/day: 0.50   Years: 10.00   Pack years: 5.00   Types: Cigarettes   Quit date: 04/14/1998   Years since quitting: 22.8  Smokeless Tobacco Never    Exercise Goals and  Review:  Exercise Goals     Row Name 02/28/21 1149             Exercise Goals   Increase Physical Activity Yes       Intervention Provide advice, education, support and counseling about physical activity/exercise needs.;Develop an individualized exercise prescription for aerobic and resistive training based on initial evaluation findings, risk stratification, comorbidities and participant's personal goals.       Expected Outcomes Short Term: Attend rehab on a regular basis to increase amount of physical activity.;Long Term: Add in home exercise to make exercise part of routine and to increase amount of physical activity.;Long Term: Exercising regularly at least 3-5 days a week.       Increase Strength and Stamina Yes       Intervention Provide advice, education, support and counseling about physical activity/exercise needs.;Develop an individualized exercise prescription for aerobic and resistive training based on initial evaluation findings, risk stratification, comorbidities and participant's personal goals.       Expected Outcomes Short Term: Increase workloads from initial exercise prescription for resistance, speed, and  METs.;Short Term: Perform resistance training exercises routinely during rehab and add in resistance training at home;Long Term: Improve cardiorespiratory fitness, muscular endurance and strength as measured by increased METs and functional capacity (6MWT)       Able to understand and use rate of perceived exertion (RPE) scale Yes       Intervention Provide education and explanation on how to use RPE scale       Expected Outcomes Short Term: Able to use RPE daily in rehab to express subjective intensity level;Long Term:  Able to use RPE to guide intensity level when exercising independently       Able to understand and use Dyspnea scale Yes       Intervention Provide education and explanation on how to use Dyspnea scale       Expected Outcomes Short Term: Able to use Dyspnea  scale daily in rehab to express subjective sense of shortness of breath during exertion;Long Term: Able to use Dyspnea scale to guide intensity level when exercising independently       Knowledge and understanding of Target Heart Rate Range (THRR) Yes       Intervention Provide education and explanation of THRR including how the numbers were predicted and where they are located for reference       Expected Outcomes Short Term: Able to state/look up THRR;Short Term: Able to use daily as guideline for intensity in rehab;Long Term: Able to use THRR to govern intensity when exercising independently       Able to check pulse independently Yes       Intervention Provide education and demonstration on how to check pulse in carotid and radial arteries.;Review the importance of being able to check your own pulse for safety during independent exercise       Expected Outcomes Short Term: Able to explain why pulse checking is important during independent exercise;Long Term: Able to check pulse independently and accurately       Understanding of Exercise Prescription Yes       Intervention Provide education, explanation, and written materials on patient's individual exercise prescription       Expected Outcomes Short Term: Able to explain program exercise prescription;Long Term: Able to explain home exercise prescription to exercise independently                Copy of goals given to participant.

## 2021-03-05 ENCOUNTER — Other Ambulatory Visit: Payer: Self-pay

## 2021-03-05 DIAGNOSIS — Z955 Presence of coronary angioplasty implant and graft: Secondary | ICD-10-CM

## 2021-03-05 LAB — GLUCOSE, CAPILLARY
Glucose-Capillary: 124 mg/dL — ABNORMAL HIGH (ref 70–99)
Glucose-Capillary: 64 mg/dL — ABNORMAL LOW (ref 70–99)
Glucose-Capillary: 68 mg/dL — ABNORMAL LOW (ref 70–99)
Glucose-Capillary: 98 mg/dL (ref 70–99)

## 2021-03-05 NOTE — Progress Notes (Signed)
Daily Session Note  Patient Details  Name: TERRIANN DIFONZO MRN: [id number] Date of Birth: 11/05/1945 Referring Provider:   Flowsheet Row Cardiac Rehab from 02/28/2021 in [facility] Cardiac and Pulmonary Rehab  Referring Provider Donnelly Angelica MD       Encounter Date: 03/05/2021  Check In:  Session Check In - 03/05/21 0927       Check-In   Supervising physician immediately available to respond to emergencies See telemetry face sheet for immediately available ER MD    Location [facility]-Cardiac & Pulmonary Rehab    Staff Present Birdie Sons, MPA, RN;Amanda Oletta Darter, BA, ACSM CEP, Exercise Physiologist;Melissa Union, RDN, Tawanna Solo, MS, ASCM CEP, Exercise Physiologist    Virtual Visit No    Medication changes reported     No    Fall or balance concerns reported    No    Warm-up and Cool-down Performed on first and last piece of equipment    Resistance Training Performed Yes    VAD Patient? No    PAD/SET Patient? No      Pain Assessment   Currently in Pain? No/denies                Social History   Tobacco Use  Smoking Status Former   Packs/day: 0.50   Years: 10.00   Pack years: 5.00   Types: Cigarettes   Quit date: 04/14/1998   Years since quitting: 22.9  Smokeless Tobacco Never    Goals Met:  Independence with exercise equipment Exercise tolerated well No report of concerns or symptoms today Strength training completed today  Goals Unmet:  Not Applicable  Comments: First full day of exercise!  Patient was oriented to gym and equipment including functions, settings, policies, and procedures.  Patient's individual exercise prescription and treatment plan were reviewed.  All starting workloads were established based on the results of the 6 minute walk test done at initial orientation visit.  The plan for exercise progression was also introduced and progression will be customized based on patient's performance and goals.    Dr. Emily Filbert is Medical Director  for [place].  Dr. Ottie Glazier is Medical Director for [facility] Pulmonary Rehabilitation.

## 2021-03-12 ENCOUNTER — Other Ambulatory Visit: Payer: Self-pay

## 2021-03-12 DIAGNOSIS — Z955 Presence of coronary angioplasty implant and graft: Secondary | ICD-10-CM | POA: Diagnosis not present

## 2021-03-12 LAB — GLUCOSE, CAPILLARY
Glucose-Capillary: 172 mg/dL — ABNORMAL HIGH (ref 70–99)
Glucose-Capillary: 82 mg/dL (ref 70–99)

## 2021-03-12 NOTE — Progress Notes (Signed)
Daily Session Note  Patient Details  Name: [name] MRN: [id number] Date of Birth: 12/05/1945 Referring Provider:   Flowsheet Row Cardiac Rehab from 02/28/2021 in [facility] Cardiac and Pulmonary Rehab  Referring Provider Donnelly Angelica MD       Encounter Date: 03/12/2021  Check In:  Session Check In - 03/12/21 0914       Check-In   Supervising physician immediately available to respond to emergencies See telemetry face sheet for immediately available ER MD    Location [facility]-Cardiac & Pulmonary Rehab    Staff Present Birdie Sons, MPA, RN;Jessica Luan Pulling, MA, RCEP, CCRP, CCET;Amanda Sommer, BA, ACSM CEP, Exercise Physiologist    Virtual Visit No    Medication changes reported     No    Fall or balance concerns reported    No    Warm-up and Cool-down Performed on first and last piece of equipment    Resistance Training Performed Yes    VAD Patient? No    PAD/SET Patient? No      Pain Assessment   Currently in Pain? No/denies                Social History   Tobacco Use  Smoking Status Former   Packs/day: 0.50   Years: 10.00   Pack years: 5.00   Types: Cigarettes   Quit date: 04/14/1998   Years since quitting: 22.9  Smokeless Tobacco Never    Goals Met:  Independence with exercise equipment Exercise tolerated well No report of concerns or symptoms today Strength training completed today  Goals Unmet:  Not Applicable  Comments: Pt able to follow exercise prescription today without complaint.  Will continue to monitor for progression.    Dr. Emily Filbert is Medical Director for [place].  Dr. Ottie Glazier is Medical Director for [facility] Pulmonary Rehabilitation.

## 2021-03-13 ENCOUNTER — Encounter: Payer: Self-pay | Admitting: *Deleted

## 2021-03-13 DIAGNOSIS — Z955 Presence of coronary angioplasty implant and graft: Secondary | ICD-10-CM

## 2021-03-13 NOTE — Progress Notes (Signed)
Cardiac Individual Treatment Plan  Patient Details  Name: [name] MRN: [id number] Date of Birth: 07/15/1945 Referring Provider:   Flowsheet Row Cardiac Rehab from 02/28/2021 in [facility] Cardiac and Pulmonary Rehab  Referring Provider Donnelly Angelica MD       Initial Encounter Date:  Flowsheet Row Cardiac Rehab from 02/28/2021 in [facility] Cardiac and Pulmonary Rehab  Date 02/28/21       Visit Diagnosis: Status post coronary artery stent placement  Patient's Home Medications on Admission:  Current Outpatient Medications:    acetaminophen (TYLENOL) 500 MG tablet, Take 500 mg by mouth 2 (two) times daily as needed for mild pain., Disp: , Rfl:    aspirin EC 81 MG tablet, Take 81 mg by mouth daily with supper., Disp: , Rfl:    atorvastatin (LIPITOR) 80 MG tablet, Take 1 tablet (80 mg total) by mouth daily., Disp: 30 tablet, Rfl: 11   Calcium Carbonate-Vitamin D (CALCIUM 600+D PO), Take 1 tablet by mouth 2 (two) times daily with a meal., Disp: , Rfl:    Cholecalciferol (VITAMIN D) 50 MCG (2000 UT) CAPS, Take 2,000 Units by mouth daily with lunch., Disp: , Rfl:    clopidogrel (PLAVIX) 75 MG tablet, Take 1 tablet (75 mg total) by mouth daily with breakfast., Disp: 30 tablet, Rfl: 11   clopidogrel (PLAVIX) 75 MG tablet, Take by mouth. (Patient not taking: Reported on 02/14/2021), Disp: , Rfl:    clotrimazole-betamethasone (LOTRISONE) cream, Apply 1 application topically daily as needed for rash., Disp: , Rfl:    fenofibrate (TRICOR) 48 MG tablet, Take 48 mg by mouth daily with lunch., Disp: , Rfl:    ferrous sulfate 325 (65 FE) MG tablet, Take 325 mg by mouth daily with supper. (Patient not taking: Reported on 02/14/2021), Disp: , Rfl:    ferrous sulfate 325 (65 FE) MG tablet, Take 1 tablet by mouth daily., Disp: , Rfl:    fluticasone (FLONASE) 50 MCG/ACT nasal spray, Place 2 sprays into both nostrils daily., Disp: , Rfl:    glipiZIDE (GLUCOTROL) 10 MG tablet, Take 10 mg by mouth 2 (two) times  daily before a meal., Disp: , Rfl:    isosorbide mononitrate (IMDUR) 30 MG 24 hr tablet, Take 30 mg by mouth daily. (Patient not taking: Reported on 02/14/2021), Disp: , Rfl:    levothyroxine (SYNTHROID, LEVOTHROID) 125 MCG tablet, Take 125 mcg by mouth daily before breakfast., Disp: , Rfl:    levothyroxine (SYNTHROID, LEVOTHROID) 25 MCG tablet, Take 25 mcg by mouth every Saturday. For a total of 148mcg on Saturdays., Disp: , Rfl:    magnesium oxide (MAG-OX) 400 MG tablet, Take 400 mg by mouth daily with breakfast., Disp: , Rfl:    metFORMIN (GLUCOPHAGE) 500 MG tablet, Take 1,000 mg by mouth 2 (two) times daily with a meal., Disp: , Rfl:    metoprolol succinate (TOPROL-XL) 25 MG 24 hr tablet, Take 25 mg by mouth daily., Disp: , Rfl:    Multiple Vitamin (MULTIVITAMIN WITH MINERALS) TABS tablet, Take 1 tablet by mouth daily with supper., Disp: , Rfl:    Multiple Vitamins-Minerals (HAIR SKIN AND NAILS FORMULA) TABS, Take 1 tablet by mouth daily with supper., Disp: , Rfl:    Multiple Vitamins-Minerals (ICAPS PO), Take 1 tablet by mouth daily with breakfast., Disp: , Rfl:    Omega-3 Fatty Acids (FISH OIL) 1000 MG CAPS, Take 1,000 mg by mouth daily with breakfast., Disp: , Rfl:    pantoprazole (PROTONIX) 40 MG tablet, Take 40 mg by mouth  daily., Disp: , Rfl:    Tetrahydrozoline HCl (VISINE OP), Place 1 drop into both eyes daily as needed (eye irritation)., Disp: , Rfl:    vitamin B-12 (CYANOCOBALAMIN) 500 MCG tablet, Take 500 mcg by mouth daily with breakfast., Disp: , Rfl:    vitamin C (ASCORBIC ACID) 500 MG tablet, Take 500 mg by mouth daily with lunch., Disp: , Rfl:   Past Medical History: Past Medical History:  Diagnosis Date   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      Exercise Target Goals: Exercise Program Goal: Individual exercise prescription set using results from initial 6 min walk test and THRR while considering  patient's activity barriers and safety.   Exercise Prescription Goal: Initial exercise prescription builds to 30-45 minutes a day of aerobic activity, 2-3 days per week.  Home exercise guidelines will be given to patient during program as part of exercise prescription that the participant will acknowledge.   Education: Aerobic Exercise: - Group verbal and visual presentation on the components of exercise prescription. Introduces F.I.T.T principle from ACSM for exercise prescriptions.  Reviews F.I.T.T. principles of aerobic exercise including progression. Written material given at graduation. Flowsheet Row Cardiac Rehab from 03/05/2021 in [facility] Cardiac and Pulmonary Rehab  Education need identified 02/28/21       Education: Resistance Exercise: - Group verbal and visual presentation on the components of exercise prescription. Introduces F.I.T.T principle from ACSM for exercise prescriptions  Reviews F.I.T.T. principles of resistance exercise including progression. Written material given at graduation.    Education: Exercise & Equipment Safety: - Individual verbal instruction and demonstration of equipment use and safety with use of the equipment. Flowsheet Row Cardiac Rehab from 03/05/2021 in [facility] Cardiac and Pulmonary Rehab  Date 02/14/21  Educator [facility]  Instruction Review Code 1- Verbalizes Understanding       Education: Exercise Physiology & General Exercise Guidelines: - Group verbal and written instruction with models to review the exercise physiology of the  cardiovascular system and associated critical values. Provides general exercise guidelines with specific guidelines to those with heart or lung disease.  Flowsheet Row Cardiac Rehab from 03/05/2021 in [facility] Cardiac and Pulmonary Rehab  Education need identified 02/28/21       Education: Flexibility, Balance, Mind/Body Relaxation: - Group verbal and visual presentation with interactive activity on the components of exercise prescription. Introduces F.I.T.T principle from ACSM for exercise prescriptions. Reviews F.I.T.T. principles of flexibility and balance exercise training including progression. Also discusses the mind body connection.  Reviews various relaxation techniques to help reduce and manage stress (i.e. Deep breathing, progressive muscle relaxation, and visualization). Balance handout provided to take home. Written material given at graduation.   Activity Barriers & Risk Stratification:  Activity Barriers & Cardiac Risk Stratification - 02/28/21 1146       Activity Barriers & Cardiac Risk Stratification   Activity Barriers Deconditioning;Muscular Weakness   legs feel weaker  Cardiac Risk Stratification Moderate             6 Minute Walk:  6 Minute Walk     Row Name 02/28/21 1145         6 Minute Walk   Phase Initial     Distance 1188 feet     Walk Time 6 minutes     # of Rest Breaks 0     MPH 2.25     METS 2.57     RPE 8     VO2 Peak 8.98     Symptoms No     Resting HR 58 bpm     Resting BP 136/70     Resting Oxygen Saturation  99 %     Exercise Oxygen Saturation  during 6 min walk 99 %     Max Ex. HR 89 bpm     Max Ex. BP 138/60     2 Minute Post BP 132/64              Oxygen Initial Assessment:   Oxygen Re-Evaluation:   Oxygen Discharge (Final Oxygen Re-Evaluation):   Initial Exercise Prescription:  Initial Exercise Prescription - 02/28/21 1100       Date of Initial Exercise RX and Referring Provider   Date 02/28/21    Referring  Provider Donnelly Angelica MD      Oxygen   Maintain Oxygen Saturation 88% or higher      Treadmill   MPH 2    Grade 1    Minutes 15    METs 2.81      NuStep   Level 1    SPM 80    Minutes 15    METs 2      T5 Nustep   Level 1    SPM 80    Minutes 15    METs 2      Track   Laps 31    Minutes 15    METs 2.69      Prescription Details   Frequency (times per week) 3    Duration Progress to 30 minutes of continuous aerobic without signs/symptoms of physical distress      Intensity   THRR 40-80% of Max Heartrate 112-135    Ratings of Perceived Exertion 11-13    Perceived Dyspnea 0-4      Progression   Progression Continue to progress workloads to maintain intensity without signs/symptoms of physical distress.      Resistance Training   Training Prescription Yes    Weight 3 lb    Reps 10-15             Perform Capillary Blood Glucose checks as needed.  Exercise Prescription Changes:   Exercise Prescription Changes     Row Name 02/28/21 1100             Response to Exercise   Blood Pressure (Admit) 136/70       Blood Pressure (Exercise) 138/60       Blood Pressure (Exit) 132/64       Heart Rate (Admit) 58 bpm       Heart Rate (Exercise) 89 bpm       Heart Rate (Exit) 70 bpm       Oxygen Saturation (Admit) 99 %       Oxygen Saturation (Exercise) 99 %       Rating of Perceived Exertion (Exercise) 8       Symptoms none  Comments walk test results                Exercise Comments:   Exercise Comments     Row Name 03/05/21 [phone]           Exercise Comments First full day of exercise!  Patient was oriented to gym and equipment including functions, settings, policies, and procedures.  Patient's individual exercise prescription and treatment plan were reviewed.  All starting workloads were established based on the results of the 6 minute walk test done at initial orientation visit.  The plan for exercise progression was also introduced and  progression will be customized based on patient's performance and goals.                Exercise Goals and Review:   Exercise Goals     Row Name 02/28/21 1149             Exercise Goals   Increase Physical Activity Yes       Intervention Provide advice, education, support and counseling about physical activity/exercise needs.;Develop an individualized exercise prescription for aerobic and resistive training based on initial evaluation findings, risk stratification, comorbidities and participant's personal goals.       Expected Outcomes Short Term: Attend rehab on a regular basis to increase amount of physical activity.;Long Term: Add in home exercise to make exercise part of routine and to increase amount of physical activity.;Long Term: Exercising regularly at least 3-5 days a week.       Increase Strength and Stamina Yes       Intervention Provide advice, education, support and counseling about physical activity/exercise needs.;Develop an individualized exercise prescription for aerobic and resistive training based on initial evaluation findings, risk stratification, comorbidities and participant's personal goals.       Expected Outcomes Short Term: Increase workloads from initial exercise prescription for resistance, speed, and METs.;Short Term: Perform resistance training exercises routinely during rehab and add in resistance training at home;Long Term: Improve cardiorespiratory fitness, muscular endurance and strength as measured by increased METs and functional capacity (6MWT)       Able to understand and use rate of perceived exertion (RPE) scale Yes       Intervention Provide education and explanation on how to use RPE scale       Expected Outcomes Short Term: Able to use RPE daily in rehab to express subjective intensity level;Long Term:  Able to use RPE to guide intensity level when exercising independently       Able to understand and use Dyspnea scale Yes       Intervention  Provide education and explanation on how to use Dyspnea scale       Expected Outcomes Short Term: Able to use Dyspnea scale daily in rehab to express subjective sense of shortness of breath during exertion;Long Term: Able to use Dyspnea scale to guide intensity level when exercising independently       Knowledge and understanding of Target Heart Rate Range (THRR) Yes       Intervention Provide education and explanation of THRR including how the numbers were predicted and where they are located for reference       Expected Outcomes Short Term: Able to state/look up THRR;Short Term: Able to use daily as guideline for intensity in rehab;Long Term: Able to use THRR to govern intensity when exercising independently       Able to check pulse independently Yes       Intervention Provide education and  demonstration on how to check pulse in carotid and radial arteries.;Review the importance of being able to check your own pulse for safety during independent exercise       Expected Outcomes Short Term: Able to explain why pulse checking is important during independent exercise;Long Term: Able to check pulse independently and accurately       Understanding of Exercise Prescription Yes       Intervention Provide education, explanation, and written materials on patient's individual exercise prescription       Expected Outcomes Short Term: Able to explain program exercise prescription;Long Term: Able to explain home exercise prescription to exercise independently                Exercise Goals Re-Evaluation :  Exercise Goals Re-Evaluation     Row Name 03/05/21 0928 03/12/21 1332           Exercise Goal Re-Evaluation   Exercise Goals Review Increase Physical Activity;Able to understand and use rate of perceived exertion (RPE) scale;Knowledge and understanding of Target Heart Rate Range (THRR);Understanding of Exercise Prescription;Increase Strength and Stamina;Able to understand and use Dyspnea scale;Able  to check pulse independently Increase Physical Activity;Increase Strength and Stamina      Comments Reviewed RPE and dyspnea scales, THR and program prescription with pt today.  Pt voiced understanding and was given a copy of goals to take home. Kailan is doing well and is learning how to use all the machines.  She is able to do prescribed workloads.  She has reached [facility] some sessions.  We will monitor progress.      Expected Outcomes Short: Use RPE daily to regulate intensity. Long: Follow program prescription in THR. Short:  attend consistently Long: improve overall stamina               Discharge Exercise Prescription (Final Exercise Prescription Changes):  Exercise Prescription Changes - 02/28/21 1100       Response to Exercise   Blood Pressure (Admit) 136/70    Blood Pressure (Exercise) 138/60    Blood Pressure (Exit) 132/64    Heart Rate (Admit) 58 bpm    Heart Rate (Exercise) 89 bpm    Heart Rate (Exit) 70 bpm    Oxygen Saturation (Admit) 99 %    Oxygen Saturation (Exercise) 99 %    Rating of Perceived Exertion (Exercise) 8    Symptoms none    Comments walk test results             Nutrition:  Target Goals: Understanding of nutrition guidelines, daily intake of sodium 1500mg , cholesterol 200mg , calories 30% from fat and 7% or less from saturated fats, daily to have 5 or more servings of fruits and vegetables.  Education: All About Nutrition: -Group instruction provided by verbal, written material, interactive activities, discussions, models, and posters to present general guidelines for heart healthy nutrition including fat, fiber, MyPlate, the role of sodium in heart healthy nutrition, utilization of the nutrition label, and utilization of this knowledge for meal planning. Follow up email sent as well. Written material given at graduation. Flowsheet Row Cardiac Rehab from 03/05/2021 in [facility] Cardiac and Pulmonary Rehab  Education need identified 02/28/21        Biometrics:  Pre Biometrics - 02/28/21 1149       Pre Biometrics   Height 5' 4.4" (1.636 m)    Weight 147 lb 3.2 oz (66.8 kg)    BMI (Calculated) 24.95    Single Leg Stand 30 seconds  Nutrition Therapy Plan and Nutrition Goals:  Nutrition Therapy & Goals - 02/28/21 1150       Intervention Plan   Intervention Prescribe, educate and counsel regarding individualized specific dietary modifications aiming towards targeted core components such as weight, hypertension, lipid management, diabetes, heart failure and other comorbidities.    Expected Outcomes Short Term Goal: Understand basic principles of dietary content, such as calories, fat, sodium, cholesterol and nutrients.;Short Term Goal: A plan has been developed with personal nutrition goals set during dietitian appointment.;Long Term Goal: Adherence to prescribed nutrition plan.             Nutrition Assessments:  MEDIFICTS Score Key: ?70 Need to make dietary changes  40-70 Heart Healthy Diet ? 40 Therapeutic Level Cholesterol Diet  Flowsheet Row Cardiac Rehab from 02/28/2021 in [facility] Cardiac and Pulmonary Rehab  Picture Your Plate Total Score on Admission 69      Picture Your Plate Scores: <03 Unhealthy dietary pattern with much room for improvement. 41-50 Dietary pattern unlikely to meet recommendations for good health and room for improvement. 51-60 More healthful dietary pattern, with some room for improvement.  >60 Healthy dietary pattern, although there may be some specific behaviors that could be improved.    Nutrition Goals Re-Evaluation:   Nutrition Goals Discharge (Final Nutrition Goals Re-Evaluation):   Psychosocial: Target Goals: Acknowledge presence or absence of significant depression and/or stress, maximize coping skills, provide positive support system. Participant is able to verbalize types and ability to use techniques and skills needed for reducing stress and depression.    Education: Stress, Anxiety, and Depression - Group verbal and visual presentation to define topics covered.  Reviews how body is impacted by stress, anxiety, and depression.  Also discusses healthy ways to reduce stress and to treat/manage anxiety and depression.  Written material given at graduation.   Education: Sleep Hygiene -Provides group verbal and written instruction about how sleep can affect your health.  Define sleep hygiene, discuss sleep cycles and impact of sleep habits. Review good sleep hygiene tips.    Initial Review & Psychosocial Screening:  Initial Psych Review & Screening - 02/14/21 0948       Initial Review   Current issues with None Identified      Family Dynamics   Good Support System? Yes    Comments Lachlyn can look to her son, husband and life group fro support. She has a positive outlook on her health.      Barriers   Psychosocial barriers to participate in program The patient should benefit from training in stress management and relaxation.;There are no identifiable barriers or psychosocial needs.      Screening Interventions   Interventions Encouraged to exercise;To provide support and resources with identified psychosocial needs;Provide feedback about the scores to participant    Expected Outcomes Short Term goal: Utilizing psychosocial counselor, staff and physician to assist with identification of specific Stressors or current issues interfering with healing process. Setting desired goal for each stressor or current issue identified.;Long Term Goal: Stressors or current issues are controlled or eliminated.;Short Term goal: Identification and review with participant of any Quality of Life or Depression concerns found by scoring the questionnaire.;Long Term goal: The participant improves quality of Life and PHQ9 Scores as seen by post scores and/or verbalization of changes             Quality of Life Scores:   Quality of Life - 02/28/21 1149        Quality of Life  Select Quality of Life      Quality of Life Scores   Health/Function Pre 28.6 %    Socioeconomic Pre 26.43 %    Psych/Spiritual Pre 29.64 %    Family Pre 27.6 %    GLOBAL Pre 28.22 %            Scores of 19 and below usually indicate a poorer quality of life in these areas.  A difference of  2-3 points is a clinically meaningful difference.  A difference of 2-3 points in the total score of the Quality of Life Index has been associated with significant improvement in overall quality of life, self-image, physical symptoms, and general health in studies assessing change in quality of life.  PHQ-9: Recent Review Flowsheet Data     Depression screen [facility] 2/9 02/28/2021   Decreased Interest 0   Down, Depressed, Hopeless 0   PHQ - 2 Score 0   Altered sleeping 0   Tired, decreased energy 0   Change in appetite 0   Feeling bad or failure about yourself  0   Trouble concentrating 0   Moving slowly or fidgety/restless 0   Suicidal thoughts 0   PHQ-9 Score 0   Difficult doing work/chores Not difficult at all      Interpretation of Total Score  Total Score Depression Severity:  1-4 = Minimal depression, 5-9 = Mild depression, 10-14 = Moderate depression, 15-19 = Moderately severe depression, 20-27 = Severe depression   Psychosocial Evaluation and Intervention:  Psychosocial Evaluation - 02/14/21 0950       Psychosocial Evaluation & Interventions   Interventions Encouraged to exercise with the program and follow exercise prescription;Relaxation education;Stress management education    Comments Brealyn can look to her son, husband and life group fro support. She has a positive outlook on her health.    Expected Outcomes Short: Start HeartTrack to help with mood. Long: Maintain a healthy mental state    Continue Psychosocial Services  Follow up required by staff             Psychosocial Re-Evaluation:   Psychosocial Discharge (Final Psychosocial  Re-Evaluation):   Vocational Rehabilitation: Provide vocational rehab assistance to qualifying candidates.   Vocational Rehab Evaluation & Intervention:   Education: Education Goals: Education classes will be provided on a variety of topics geared toward better understanding of heart health and risk factor modification. Participant will state understanding/return demonstration of topics presented as noted by education test scores.  Learning Barriers/Preferences:  Learning Barriers/Preferences - 02/14/21 0947       Learning Barriers/Preferences   Learning Barriers None    Learning Preferences None             General Cardiac Education Topics:  AED/CPR: - Group verbal and written instruction with the use of models to demonstrate the basic use of the AED with the basic ABC's of resuscitation.   Anatomy and Cardiac Procedures: - Group verbal and visual presentation and models provide information about basic cardiac anatomy and function. Reviews the testing methods done to diagnose heart disease and the outcomes of the test results. Describes the treatment choices: Medical Management, Angioplasty, or Coronary Bypass Surgery for treating various heart conditions including Myocardial Infarction, Angina, Valve Disease, and Cardiac Arrhythmias.  Written material given at graduation.   Medication Safety: - Group verbal and visual instruction to review commonly prescribed medications for heart and lung disease. Reviews the medication, class of the drug, and side effects. Includes the steps to properly  store meds and maintain the prescription regimen.  Written material given at graduation. Flowsheet Row Cardiac Rehab from 03/05/2021 in [facility] Cardiac and Pulmonary Rehab  Date 03/05/21  Educator SB  Instruction Review Code 1- Verbalizes Understanding       Intimacy: - Group verbal instruction through game format to discuss how heart and lung disease can affect sexual intimacy. Written  material given at graduation..   Know Your Numbers and Heart Failure: - Group verbal and visual instruction to discuss disease risk factors for cardiac and pulmonary disease and treatment options.  Reviews associated critical values for Overweight/Obesity, Hypertension, Cholesterol, and Diabetes.  Discusses basics of heart failure: signs/symptoms and treatments.  Introduces Heart Failure Zone chart for action plan for heart failure.  Written material given at graduation. Flowsheet Row Cardiac Rehab from 03/05/2021 in [facility] Cardiac and Pulmonary Rehab  Education need identified 02/28/21       Infection Prevention: - Provides verbal and written material to individual with discussion of infection control including proper hand washing and proper equipment cleaning during exercise session. Flowsheet Row Cardiac Rehab from 03/05/2021 in [facility] Cardiac and Pulmonary Rehab  Date 02/14/21  Educator [facility]  Instruction Review Code 1- Verbalizes Understanding       Falls Prevention: - Provides verbal and written material to individual with discussion of falls prevention and safety. Flowsheet Row Cardiac Rehab from 03/05/2021 in [facility] Cardiac and Pulmonary Rehab  Date 02/14/21  Educator [facility]  Instruction Review Code 1- Verbalizes Understanding       Other: -Provides group and verbal instruction on various topics (see comments)   Knowledge Questionnaire Score:  Knowledge Questionnaire Score - 02/28/21 1150       Knowledge Questionnaire Score   Pre Score 22/26             Core Components/Risk Factors/Patient Goals at Admission:  Personal Goals and Risk Factors at Admission - 02/28/21 1151       Core Components/Risk Factors/Patient Goals on Admission    Weight Management Yes;Weight Loss    Intervention Weight Management: Develop a combined nutrition and exercise program designed to reach desired caloric intake, while maintaining appropriate intake of nutrient and fiber, sodium and fats,  and appropriate energy expenditure required for the weight goal.;Weight Management/Obesity: Establish reasonable short term and long term weight goals.;Weight Management: Provide education and appropriate resources to help participant work on and attain dietary goals.    Admit Weight 147 lb 3.2 oz (66.8 kg)    Goal Weight: Short Term 142 lb (64.4 kg)    Goal Weight: Long Term 135 lb (61.2 kg)    Expected Outcomes Short Term: Continue to assess and modify interventions until short term weight is achieved;Long Term: Adherence to nutrition and physical activity/exercise program aimed toward attainment of established weight goal;Weight Loss: Understanding of general recommendations for a balanced deficit meal plan, which promotes 1-2 lb weight loss per week and includes a negative energy balance of [phone] kcal/d;Understanding recommendations for meals to include 15-35% energy as protein, 25-35% energy from fat, 35-60% energy from carbohydrates, less than 200mg  of dietary cholesterol, 20-35 gm of total fiber daily;Understanding of distribution of calorie intake throughout the day with the consumption of 4-5 meals/snacks    Diabetes Yes    Intervention Provide education about signs/symptoms and action to take for hypo/hyperglycemia.;Provide education about proper nutrition, including hydration, and aerobic/resistive exercise prescription along with prescribed medications to achieve blood glucose in normal ranges: Fasting glucose 65-99 mg/dL    Expected Outcomes Short  Term: Participant verbalizes understanding of the signs/symptoms and immediate care of hyper/hypoglycemia, proper foot care and importance of medication, aerobic/resistive exercise and nutrition plan for blood glucose control.;Long Term: Attainment of HbA1C < 7%.    Hypertension Yes    Intervention Provide education on lifestyle modifcations including regular physical activity/exercise, weight management, moderate sodium restriction and increased  consumption of fresh fruit, vegetables, and low fat dairy, alcohol moderation, and smoking cessation.;Monitor prescription use compliance.    Expected Outcomes Short Term: Continued assessment and intervention until BP is < 140/46mm HG in hypertensive participants. < 130/51mm HG in hypertensive participants with diabetes, heart failure or chronic kidney disease.;Long Term: Maintenance of blood pressure at goal levels.    Lipids Yes    Intervention Provide education and support for participant on nutrition & aerobic/resistive exercise along with prescribed medications to achieve LDL 70mg , HDL >40mg .    Expected Outcomes Short Term: Participant states understanding of desired cholesterol values and is compliant with medications prescribed. Participant is following exercise prescription and nutrition guidelines.;Long Term: Cholesterol controlled with medications as prescribed, with individualized exercise RX and with personalized nutrition plan. Value goals: LDL < 70mg , HDL > 40 mg.             Education:Diabetes - Individual verbal and written instruction to review signs/symptoms of diabetes, desired ranges of glucose level fasting, after meals and with exercise. Acknowledge that pre and post exercise glucose checks will be done for 3 sessions at entry of program. [place] from 03/05/2021 in [facility] Cardiac and Pulmonary Rehab  Date 02/14/21  Educator [facility]  Instruction Review Code 1- Verbalizes Understanding       Core Components/Risk Factors/Patient Goals Review:    Core Components/Risk Factors/Patient Goals at Discharge (Final Review):    ITP Comments:  ITP Comments     Row Name 02/14/21 0947 02/28/21 1144 03/05/21 0927 03/13/21 0740     ITP Comments Virtual Visit completed. Patient informed on EP and RD appointment and 6 Minute walk test. Patient also informed of patient health questionnaires on My Chart. Patient Verbalizes understanding. Visit diagnosis can be found  in [facility] 01/30/2021. Completed 6MWT and gym orientation. Initial ITP created and sent for review to Dr. Emily Filbert, Medical Director. First full day of exercise!  Patient was oriented to gym and equipment including functions, settings, policies, and procedures.  Patient's individual exercise prescription and treatment plan were reviewed.  All starting workloads were established based on the results of the 6 minute walk test done at initial orientation visit.  The plan for exercise progression was also introduced and progression will be customized based on patient's performance and goals. 30 Day review completed. Medical Director ITP review done, changes made as directed, and signed approval by Medical Director.    New to program             Comments:

## 2021-03-14 ENCOUNTER — Encounter: Payer: Medicare Other | Attending: Cardiology

## 2021-03-14 ENCOUNTER — Other Ambulatory Visit: Payer: Self-pay

## 2021-03-14 DIAGNOSIS — E119 Type 2 diabetes mellitus without complications: Secondary | ICD-10-CM | POA: Insufficient documentation

## 2021-03-14 DIAGNOSIS — Z955 Presence of coronary angioplasty implant and graft: Secondary | ICD-10-CM | POA: Insufficient documentation

## 2021-03-14 LAB — GLUCOSE, CAPILLARY
Glucose-Capillary: 126 mg/dL — ABNORMAL HIGH (ref 70–99)
Glucose-Capillary: 91 mg/dL (ref 70–99)

## 2021-03-14 NOTE — Progress Notes (Signed)
Daily Session Note  Patient Details  Name: [name] MRN: [id number] Date of Birth: April 28, 1945 Referring Provider:   Flowsheet Row Cardiac Rehab from 02/28/2021 in [facility] Cardiac and Pulmonary Rehab  Referring Provider Donnelly Angelica MD       Encounter Date: 03/14/2021  Check In:  Session Check In - 03/14/21 0925       Check-In   Supervising physician immediately available to respond to emergencies See telemetry face sheet for immediately available ER MD    Location [facility]-Cardiac & Pulmonary Rehab    Staff Present Birdie Sons, MPA, Mauricia [place], BS, ACSM CEP, Exercise Physiologist;Amanda Oletta Darter, BA, ACSM CEP, Exercise Physiologist    Virtual Visit No    Medication changes reported     No    Fall or balance concerns reported    No    Warm-up and Cool-down Performed on first and last piece of equipment    Resistance Training Performed Yes    VAD Patient? No    PAD/SET Patient? No      Pain Assessment   Currently in Pain? No/denies                Social History   Tobacco Use  Smoking Status Former   Packs/day: 0.50   Years: 10.00   Pack years: 5.00   Types: Cigarettes   Quit date: 04/14/1998   Years since quitting: 22.9  Smokeless Tobacco Never    Goals Met:  Independence with exercise equipment Exercise tolerated well No report of concerns or symptoms today Strength training completed today  Goals Unmet:  Not Applicable  Comments: Pt able to follow exercise prescription today without complaint.  Will continue to monitor for progression.    Dr. Emily Filbert is Medical Director for [place].  Dr. Ottie Glazier is Medical Director for [facility] Pulmonary Rehabilitation.

## 2021-03-15 ENCOUNTER — Other Ambulatory Visit: Payer: Self-pay

## 2021-03-15 ENCOUNTER — Encounter: Payer: Medicare Other | Admitting: *Deleted

## 2021-03-15 DIAGNOSIS — Z955 Presence of coronary angioplasty implant and graft: Secondary | ICD-10-CM

## 2021-03-15 NOTE — Progress Notes (Signed)
Daily Session Note  Patient Details  Name: [name] MRN: [id number] Date of Birth: [birth year] Referring Provider:   Flowsheet Row Cardiac Rehab from 02/28/2021 in [facility] Cardiac and Pulmonary Rehab  Referring Provider Donnelly Angelica MD       Encounter Date: 03/15/2021  Check In:  Session Check In - 03/15/21 1005       Check-In   Supervising physician immediately available to respond to emergencies See telemetry face sheet for immediately available ER MD    Location [facility]-Cardiac & Pulmonary Rehab    Staff Present Heath Lark, RN, BSN, CCRP;Joseph [place], RCP,RRT,BSRT;Jessica Sophia, [place], [place], CCRP, CCET    Virtual Visit No    Medication changes reported     No    Fall or balance concerns reported    No    Warm-up and Cool-down Performed on first and last piece of equipment    Resistance Training Performed Yes    VAD Patient? No    PAD/SET Patient? No      Pain Assessment   Currently in Pain? No/denies               Exercise Prescription Changes - 03/15/21 0900       Home Exercise Plan   Plans to continue exercise at Home (comment)   walking, treadmill   Frequency Add 2 additional days to program exercise sessions.    Initial Home Exercises Provided 03/15/21      Oxygen   Maintain Oxygen Saturation 88% or higher             Social History   Tobacco Use  Smoking Status Former   Packs/day: 0.50   Years: 10.00   Pack years: 5.00   Types: Cigarettes   Quit date: 04/14/1998   Years since quitting: 22.9  Smokeless Tobacco Never    Goals Met:  Independence with exercise equipment Exercise tolerated well No report of concerns or symptoms today  Goals Unmet:  Not Applicable  Comments: Pt able to follow exercise prescription today without complaint.  Will continue to monitor for progression.    Dr. Emily Filbert is Medical Director for [place].  Dr. Ottie Glazier is Medical Director for [facility] Pulmonary  Rehabilitation.

## 2021-03-15 NOTE — Progress Notes (Signed)
Reviewed home exercise with pt today.  Pt plans to walk and use treadmill at home for exercise.  Reviewed THR, pulse, RPE, sign and symptoms, pulse oximetery and when to call 911 or MD.  Also discussed weather considerations and indoor options.  Pt voiced understanding.

## 2021-03-19 ENCOUNTER — Encounter: Payer: Medicare Other | Admitting: *Deleted

## 2021-03-19 ENCOUNTER — Other Ambulatory Visit: Payer: Self-pay

## 2021-03-19 DIAGNOSIS — Z955 Presence of coronary angioplasty implant and graft: Secondary | ICD-10-CM

## 2021-03-19 NOTE — Progress Notes (Signed)
Daily Session Note  Patient Details  Name: [name] MRN: [id number] Date of Birth: 01-25-1946 Referring Provider:   Flowsheet Row Cardiac Rehab from 02/28/2021 in [facility] Cardiac and Pulmonary Rehab  Referring Provider Donnelly Angelica MD       Encounter Date: 03/19/2021  Check In:  Session Check In - 03/19/21 0939       Check-In   Supervising physician immediately available to respond to emergencies See telemetry face sheet for immediately available ER MD    Location [facility]-Cardiac & Pulmonary Rehab    Staff Present Nyoka Cowden, RN, BSN, Willette Pa, MA, RCEP, CCRP, [place], [place], ACSM CEP, Exercise Physiologist    Virtual Visit No    Medication changes reported     No    Fall or balance concerns reported    No    Tobacco Cessation No Change    Warm-up and Cool-down Performed on first and last piece of equipment    Resistance Training Performed Yes    VAD Patient? No    PAD/SET Patient? No      Pain Assessment   Currently in Pain? No/denies                Social History   Tobacco Use  Smoking Status Former   Packs/day: 0.50   Years: 10.00   Pack years: 5.00   Types: Cigarettes   Quit date: 04/14/1998   Years since quitting: 22.9  Smokeless Tobacco Never    Goals Met:  Independence with exercise equipment Exercise tolerated well No report of concerns or symptoms today  Goals Unmet:  Not Applicable  Comments: Pt able to follow exercise prescription today without complaint.  Will continue to monitor for progression.    Dr. Emily Filbert is Medical Director for [place].  Dr. Ottie Glazier is Medical Director for [facility] Pulmonary Rehabilitation.

## 2021-03-21 ENCOUNTER — Other Ambulatory Visit: Payer: Self-pay

## 2021-03-21 DIAGNOSIS — Z955 Presence of coronary angioplasty implant and graft: Secondary | ICD-10-CM

## 2021-03-21 NOTE — Progress Notes (Signed)
Daily Session Note  Patient Details  Name: MELVINIA ASHBY MRN: [id number] Date of Birth: 12/12/1945 Referring Provider:   Flowsheet Row Cardiac Rehab from 02/28/2021 in [facility] Cardiac and Pulmonary Rehab  Referring Provider Donnelly Angelica MD       Encounter Date: 03/21/2021  Check In:  Session Check In - 03/21/21 0910       Check-In   Supervising physician immediately available to respond to emergencies See telemetry face sheet for immediately available ER MD    Location [facility]-Cardiac & Pulmonary Rehab    Staff Present Birdie Sons, MPA, Elveria Rising, BA, ACSM CEP, Exercise Physiologist;Lathaniel Legate Amedeo Plenty, BS, ACSM CEP, Exercise Physiologist    Virtual Visit No    Medication changes reported     No    Fall or balance concerns reported    No    Tobacco Cessation No Change    Warm-up and Cool-down Performed on first and last piece of equipment    Resistance Training Performed Yes    VAD Patient? No    PAD/SET Patient? No      Pain Assessment   Currently in Pain? No/denies                Social History   Tobacco Use  Smoking Status Former   Packs/day: 0.50   Years: 10.00   Pack years: 5.00   Types: Cigarettes   Quit date: 04/14/1998   Years since quitting: 22.9  Smokeless Tobacco Never    Goals Met:  Independence with exercise equipment Exercise tolerated well No report of concerns or symptoms today Strength training completed today  Goals Unmet:  Not Applicable  Comments: Pt able to follow exercise prescription today without complaint.  Will continue to monitor for progression.    Dr. Emily Filbert is Medical Director for [place].  Dr. Ottie Glazier is Medical Director for [facility] Pulmonary Rehabilitation.

## 2021-03-22 ENCOUNTER — Other Ambulatory Visit: Payer: Self-pay

## 2021-03-22 ENCOUNTER — Encounter: Payer: Medicare Other | Admitting: *Deleted

## 2021-03-22 DIAGNOSIS — Z955 Presence of coronary angioplasty implant and graft: Secondary | ICD-10-CM | POA: Diagnosis not present

## 2021-03-22 NOTE — Progress Notes (Signed)
Daily Session Note  Patient Details  Name: [name] MRN: [id number] Date of Birth: 09/22/1945 Referring Provider:   Flowsheet Row Cardiac Rehab from 02/28/2021 in [facility] Cardiac and Pulmonary Rehab  Referring Provider Donnelly Angelica MD       Encounter Date: 03/22/2021  Check In:  Session Check In - 03/22/21 0928       Check-In   Supervising physician immediately available to respond to emergencies See telemetry face sheet for immediately available ER MD    Location [facility]-Cardiac & Pulmonary Rehab    Staff Present Renita Papa, RN BSN;Joseph [place], RCP,RRT,BSRT;Jessica [place], [place], RCEP, CCRP, CCET    Virtual Visit No    Medication changes reported     No    Fall or balance concerns reported    No    Warm-up and Cool-down Performed on first and last piece of equipment    Resistance Training Performed Yes    VAD Patient? No    PAD/SET Patient? No      Pain Assessment   Currently in Pain? No/denies                Social History   Tobacco Use  Smoking Status Former   Packs/day: 0.50   Years: 10.00   Pack years: 5.00   Types: Cigarettes   Quit date: 04/14/1998   Years since quitting: 22.9  Smokeless Tobacco Never    Goals Met:  Independence with exercise equipment Exercise tolerated well No report of concerns or symptoms today Strength training completed today  Goals Unmet:  Not Applicable  Comments: Pt able to follow exercise prescription today without complaint.  Will continue to monitor for progression.    Dr. Emily Filbert is Medical Director for [place].  Dr. Ottie Glazier is Medical Director for [facility] Pulmonary Rehabilitation.

## 2021-03-26 ENCOUNTER — Other Ambulatory Visit: Payer: Self-pay

## 2021-03-26 DIAGNOSIS — Z955 Presence of coronary angioplasty implant and graft: Secondary | ICD-10-CM

## 2021-03-26 NOTE — Progress Notes (Signed)
Daily Session Note  Patient Details  Name: [name] MRN: [id number] Date of Birth: 10/31/1945 Referring Provider:   Flowsheet Row Cardiac Rehab from 02/28/2021 in [facility] Cardiac and Pulmonary Rehab  Referring Provider Donnelly Angelica MD       Encounter Date: 03/26/2021  Check In:  Session Check In - 03/26/21 0911       Check-In   Supervising physician immediately available to respond to emergencies See telemetry face sheet for immediately available ER MD    Location [facility]-Cardiac & Pulmonary Rehab    Staff Present Birdie Sons, MPA, RN;Jessica [place], MA, RCEP, CCRP, CCET;Amanda Sommer, BA, ACSM CEP, Exercise Physiologist    Virtual Visit No    Medication changes reported     No    Fall or balance concerns reported    No    Tobacco Cessation No Change    Warm-up and Cool-down Performed on first and last piece of equipment    Resistance Training Performed Yes    VAD Patient? No    PAD/SET Patient? No      Pain Assessment   Currently in Pain? No/denies                Social History   Tobacco Use  Smoking Status Former   Packs/day: 0.50   Years: 10.00   Pack years: 5.00   Types: Cigarettes   Quit date: 04/14/1998   Years since quitting: 22.9  Smokeless Tobacco Never    Goals Met:  Independence with exercise equipment Exercise tolerated well No report of concerns or symptoms today Strength training completed today  Goals Unmet:  Not Applicable  Comments: Pt able to follow exercise prescription today without complaint.  Will continue to monitor for progression.    Dr. Emily Filbert is Medical Director for [place].  Dr. Ottie Glazier is Medical Director for [facility] Pulmonary Rehabilitation.

## 2021-03-28 ENCOUNTER — Other Ambulatory Visit: Payer: Self-pay

## 2021-03-28 DIAGNOSIS — Z955 Presence of coronary angioplasty implant and graft: Secondary | ICD-10-CM | POA: Diagnosis not present

## 2021-03-28 NOTE — Progress Notes (Signed)
Daily Session Note  Patient Details  Name: [name] MRN: [id number] Date of Birth: Feb 05, 1946 Referring Provider:   Flowsheet Row Cardiac Rehab from 02/28/2021 in [facility] Cardiac and Pulmonary Rehab  Referring Provider Donnelly Angelica MD       Encounter Date: 03/28/2021  Check In:  Session Check In - 03/28/21 0911       Check-In   Supervising physician immediately available to respond to emergencies See telemetry face sheet for immediately available ER MD    Location [facility]-Cardiac & Pulmonary Rehab    Staff Present Birdie Sons, MPA, RN;Amanda Oletta Darter, BA, ACSM CEP, Exercise Physiologist;Yandel Zeiner Amedeo Plenty, BS, ACSM CEP, Exercise Physiologist    Virtual Visit No    Medication changes reported     No    Fall or balance concerns reported    No    Tobacco Cessation No Change    Warm-up and Cool-down Performed on first and last piece of equipment    Resistance Training Performed Yes    VAD Patient? No    PAD/SET Patient? No      Pain Assessment   Currently in Pain? No/denies                Social History   Tobacco Use  Smoking Status Former   Packs/day: 0.50   Years: 10.00   Pack years: 5.00   Types: Cigarettes   Quit date: 04/14/1998   Years since quitting: 22.9  Smokeless Tobacco Never    Goals Met:  Independence with exercise equipment Exercise tolerated well No report of concerns or symptoms today Strength training completed today  Goals Unmet:  Not Applicable  Comments: Pt able to follow exercise prescription today without complaint.  Will continue to monitor for progression.    Dr. Emily Filbert is Medical Director for [place].  Dr. Ottie Glazier is Medical Director for [facility] Pulmonary Rehabilitation.

## 2021-03-29 DIAGNOSIS — Z955 Presence of coronary angioplasty implant and graft: Secondary | ICD-10-CM

## 2021-03-29 NOTE — Progress Notes (Signed)
Daily Session Note  Patient Details  Name: [name] MRN: [id number] Date of Birth: 1945-09-28 Referring Provider:   Flowsheet Row Cardiac Rehab from 02/28/2021 in [facility] Cardiac and Pulmonary Rehab  Referring Provider Donnelly Angelica MD       Encounter Date: 03/29/2021  Check In:  Session Check In - 03/29/21 0938       Check-In   Supervising physician immediately available to respond to emergencies See telemetry face sheet for immediately available ER MD    Location [facility]-Cardiac & Pulmonary Rehab    Staff Present Alberteen Sam, MA, RCEP, CCRP, CCET;Ellenie Salome, RN, BSN;Joseph [place], [place]    Virtual Visit No    Medication changes reported     No    Fall or balance concerns reported    No    Tobacco Cessation No Change    Warm-up and Cool-down Performed on first and last piece of equipment    Resistance Training Performed Yes    VAD Patient? No    PAD/SET Patient? No      Pain Assessment   Currently in Pain? No/denies                Social History   Tobacco Use  Smoking Status Former   Packs/day: 0.50   Years: 10.00   Pack years: 5.00   Types: Cigarettes   Quit date: 04/14/1998   Years since quitting: 22.9  Smokeless Tobacco Never    Goals Met:  Independence with exercise equipment Exercise tolerated well No report of concerns or symptoms today Strength training completed today  Goals Unmet:  Not Applicable  Comments: Pt able to follow exercise prescription today without complaint.  Will continue to monitor for progression.   Dr. Emily Filbert is Medical Director for [place].  Dr. Ottie Glazier is Medical Director for [facility] Pulmonary Rehabilitation.

## 2021-04-02 ENCOUNTER — Other Ambulatory Visit: Payer: Self-pay

## 2021-04-02 DIAGNOSIS — Z955 Presence of coronary angioplasty implant and graft: Secondary | ICD-10-CM

## 2021-04-02 NOTE — Progress Notes (Signed)
Completed intial RD Consultation

## 2021-04-02 NOTE — Progress Notes (Signed)
Daily Session Note  Patient Details  Name: COURTLYN AKI MRN: [id number] Date of Birth: 07-22-1945 Referring Provider:   Flowsheet Row Cardiac Rehab from 02/28/2021 in [facility] Cardiac and Pulmonary Rehab  Referring Provider Donnelly Angelica MD       Encounter Date: 04/02/2021  Check In:  Session Check In - 04/02/21 0917       Check-In   Supervising physician immediately available to respond to emergencies See telemetry face sheet for immediately available ER MD    Location [facility]-Cardiac & Pulmonary Rehab    Staff Present Birdie Sons, MPA, RN;Amanda Oletta Darter, BA, ACSM CEP, Exercise Physiologist;Jessica [place], MA, RCEP, CCRP, CCET    Virtual Visit No    Medication changes reported     No    Fall or balance concerns reported    No    Tobacco Cessation No Change    Warm-up and Cool-down Performed on first and last piece of equipment    Resistance Training Performed Yes    VAD Patient? No    PAD/SET Patient? No      Pain Assessment   Currently in Pain? No/denies                Social History   Tobacco Use  Smoking Status Former   Packs/day: 0.50   Years: 10.00   Pack years: 5.00   Types: Cigarettes   Quit date: 04/14/1998   Years since quitting: 22.9  Smokeless Tobacco Never    Goals Met:  Independence with exercise equipment Exercise tolerated well No report of concerns or symptoms today Strength training completed today  Goals Unmet:  Not Applicable  Comments: Pt able to follow exercise prescription today without complaint.  Will continue to monitor for progression.    Dr. Emily Filbert is Medical Director for [place].  Dr. Ottie Glazier is Medical Director for [facility] Pulmonary Rehabilitation.

## 2021-04-04 ENCOUNTER — Other Ambulatory Visit: Payer: Self-pay

## 2021-04-04 DIAGNOSIS — Z955 Presence of coronary angioplasty implant and graft: Secondary | ICD-10-CM | POA: Diagnosis not present

## 2021-04-04 NOTE — Progress Notes (Signed)
Daily Session Note  Patient Details  Name: DELRAE HAGEY MRN: [id number] Date of Birth: 02-08-1946 Referring Provider:   Flowsheet Row Cardiac Rehab from 02/28/2021 in [facility] Cardiac and Pulmonary Rehab  Referring Provider Donnelly Angelica MD       Encounter Date: 04/04/2021  Check In:  Session Check In - 04/04/21 0918       Check-In   Supervising physician immediately available to respond to emergencies See telemetry face sheet for immediately available ER MD    Location [facility]-Cardiac & Pulmonary Rehab    Staff Present Birdie Sons, MPA, Elveria Rising, BA, ACSM CEP, Exercise Physiologist;Hisao Doo Amedeo Plenty, BS, ACSM CEP, Exercise Physiologist    Virtual Visit No    Medication changes reported     No    Fall or balance concerns reported    No    Tobacco Cessation No Change    Warm-up and Cool-down Performed on first and last piece of equipment    Resistance Training Performed Yes    VAD Patient? No    PAD/SET Patient? No      Pain Assessment   Currently in Pain? No/denies                Social History   Tobacco Use  Smoking Status Former   Packs/day: 0.50   Years: 10.00   Pack years: 5.00   Types: Cigarettes   Quit date: 04/14/1998   Years since quitting: 22.9  Smokeless Tobacco Never    Goals Met:  Independence with exercise equipment Exercise tolerated well No report of concerns or symptoms today Strength training completed today  Goals Unmet:  Not Applicable  Comments: Pt able to follow exercise prescription today without complaint.  Will continue to monitor for progression.    Dr. Emily Filbert is Medical Director for [place].  Dr. Ottie Glazier is Medical Director for [facility] Pulmonary Rehabilitation.

## 2021-04-05 ENCOUNTER — Other Ambulatory Visit: Payer: Self-pay

## 2021-04-05 ENCOUNTER — Encounter: Payer: Medicare Other | Admitting: *Deleted

## 2021-04-05 DIAGNOSIS — Z955 Presence of coronary angioplasty implant and graft: Secondary | ICD-10-CM

## 2021-04-05 NOTE — Progress Notes (Signed)
Daily Session Note  Patient Details  Name: [name] MRN: [id number] Date of Birth: May 20, 1945 Referring Provider:   Flowsheet Row Cardiac Rehab from 02/28/2021 in [facility] Cardiac [facility] Pul[facility] Re[facility]  Referring Provider Donnelly Angelica MD       Encounter Date: 04/05/2021  Check In:  Session Check In - 04/05/21 0924       Check-In   Supervising physician immediately available to respond to emergencies See telemetry face sheet for immediately available ER MD    Location [facility]-Cardiac & Pulmonary Rehab    Staff Present Renita Papa, RN BSN;Joseph O'Neill, RCP,RRT,BSRT;Jessica [place], [place], RCEP, CCRP, CCET    Virtual Visit No    Medication changes reported     No    Fall or balance concerns reported    No    Warm-up and Cool-down Performed on first and last piece of equipment    Resistance Training Performed Yes    VAD Patient? No    PAD/SET Patient? No      Pain Assessment   Currently in Pain? No/denies                Social History   Tobacco Use  Smoking Status Former   Packs/day: 0.50   Years: 10.00   Pack years: 5.00   Types: Cigarettes   Quit date: 04/14/1998   Years since quitting: 22.9  Smokeless Tobacco Never    Goals Met:  Independence with exercise equipment Exercise tolerated well No report of concerns or symptoms today Strength training completed today  Goals Unmet:  Not Applicable  Comments: Pt able to follow exercise prescription today without complaint.  Will continue to monitor for progression.    Dr. Emily Filbert is Medical Director for [place].  Dr. Ottie Glazier is Medical Director for [facility] Pulmonary Rehabilitation.

## 2021-04-09 ENCOUNTER — Other Ambulatory Visit: Payer: Self-pay

## 2021-04-09 ENCOUNTER — Encounter: Payer: Medicare Other | Admitting: *Deleted

## 2021-04-09 DIAGNOSIS — Z955 Presence of coronary angioplasty implant and graft: Secondary | ICD-10-CM | POA: Diagnosis not present

## 2021-04-09 NOTE — Progress Notes (Signed)
Daily Session Note  Patient Details  Name: [name] MRN: [id number] Date of Birth: 08/11/1945 Referring Provider:   Flowsheet Row Cardiac Rehab from 02/28/2021 in [facility] Cardiac and Pulmonary Rehab  Referring Provider Donnelly Angelica MD       Encounter Date: 04/09/2021  Check In:  Session Check In - 04/09/21 1012       Check-In   Supervising physician immediately available to respond to emergencies See telemetry face sheet for immediately available ER MD    Location [facility]-Cardiac & Pulmonary Rehab    Staff Present Heath Lark, RN, BSN, CCRP;Laureen Owens Shark, BS, RRT, CPFT;Amanda Oletta Darter, BA, ACSM CEP, Exercise Physiologist    Virtual Visit No    Medication changes reported     No    Fall or balance concerns reported    No    Warm-up and Cool-down Performed on first and last piece of equipment    Resistance Training Performed Yes    VAD Patient? No    PAD/SET Patient? No      Pain Assessment   Currently in Pain? No/denies                Social History   Tobacco Use  Smoking Status Former   Packs/day: 0.50   Years: 10.00   Pack years: 5.00   Types: Cigarettes   Quit date: 04/14/1998   Years since quitting: 23.0  Smokeless Tobacco Never    Goals Met:  Independence with exercise equipment Exercise tolerated well No report of concerns or symptoms today  Goals Unmet:  Not Applicable  Comments: Pt able to follow exercise prescription today without complaint.  Will continue to monitor for progression.    Dr. Emily Filbert is Medical Director for [place].  Dr. Ottie Glazier is Medical Director for [facility] Pulmonary Rehabilitation.

## 2021-04-10 ENCOUNTER — Encounter: Payer: Self-pay | Admitting: *Deleted

## 2021-04-10 DIAGNOSIS — Z955 Presence of coronary angioplasty implant and graft: Secondary | ICD-10-CM

## 2021-04-10 NOTE — Progress Notes (Signed)
Cardiac Individual Treatment Plan  Patient Details  Name: [name] MRN: [id number] Date of Birth: 1945-12-26 Referring Provider:   Flowsheet Row Cardiac Rehab from 02/28/2021 in [facility] Cardiac and Pulmonary Rehab  Referring Provider Donnelly Angelica MD       Initial Encounter Date:  Flowsheet Row Cardiac Rehab from 02/28/2021 in [facility] Cardiac and Pulmonary Rehab  Date 02/28/21       Visit Diagnosis: Status post coronary artery stent placement  Patient's Home Medications on Admission:  Current Outpatient Medications:    acetaminophen (TYLENOL) 500 MG tablet, Take 500 mg by mouth 2 (two) times daily as needed for mild pain., Disp: , Rfl:    aspirin EC 81 MG tablet, Take 81 mg by mouth daily with supper., Disp: , Rfl:    atorvastatin (LIPITOR) 80 MG tablet, Take 1 tablet (80 mg total) by mouth daily., Disp: 30 tablet, Rfl: 11   Calcium Carbonate-Vitamin D (CALCIUM 600+D PO), Take 1 tablet by mouth 2 (two) times daily with a meal., Disp: , Rfl:    Cholecalciferol (VITAMIN D) 50 MCG (2000 UT) CAPS, Take 2,000 Units by mouth daily with lunch., Disp: , Rfl:    clopidogrel (PLAVIX) 75 MG tablet, Take 1 tablet (75 mg total) by mouth daily with breakfast., Disp: 30 tablet, Rfl: 11   clopidogrel (PLAVIX) 75 MG tablet, Take by mouth. (Patient not taking: Reported on 02/14/2021), Disp: , Rfl:    clotrimazole-betamethasone (LOTRISONE) cream, Apply 1 application topically daily as needed for rash., Disp: , Rfl:    fenofibrate (TRICOR) 48 MG tablet, Take 48 mg by mouth daily with lunch., Disp: , Rfl:    ferrous sulfate 325 (65 FE) MG tablet, Take 325 mg by mouth daily with supper. (Patient not taking: Reported on 02/14/2021), Disp: , Rfl:    ferrous sulfate 325 (65 FE) MG tablet, Take 1 tablet by mouth daily., Disp: , Rfl:    fluticasone (FLONASE) 50 MCG/ACT nasal spray, Place 2 sprays into both nostrils daily., Disp: , Rfl:    glipiZIDE (GLUCOTROL) 10 MG tablet, Take 10 mg by mouth 2 (two) times  daily before a meal., Disp: , Rfl:    isosorbide mononitrate (IMDUR) 30 MG 24 hr tablet, Take 30 mg by mouth daily. (Patient not taking: Reported on 02/14/2021), Disp: , Rfl:    levothyroxine (SYNTHROID, LEVOTHROID) 125 MCG tablet, Take 125 mcg by mouth daily before breakfast., Disp: , Rfl:    levothyroxine (SYNTHROID, LEVOTHROID) 25 MCG tablet, Take 25 mcg by mouth every Saturday. For a total of 128mg on Saturdays., Disp: , Rfl:    magnesium oxide (MAG-OX) 400 MG tablet, Take 400 mg by mouth daily with breakfast., Disp: , Rfl:    metFORMIN (GLUCOPHAGE) 500 MG tablet, Take 1,000 mg by mouth 2 (two) times daily with a meal., Disp: , Rfl:    metoprolol succinate (TOPROL-XL) 25 MG 24 hr tablet, Take 25 mg by mouth daily., Disp: , Rfl:    Multiple Vitamin (MULTIVITAMIN WITH MINERALS) TABS tablet, Take 1 tablet by mouth daily with supper., Disp: , Rfl:    Multiple Vitamins-Minerals (HAIR SKIN AND NAILS FORMULA) TABS, Take 1 tablet by mouth daily with supper., Disp: , Rfl:    Multiple Vitamins-Minerals (ICAPS PO), Take 1 tablet by mouth daily with breakfast., Disp: , Rfl:    Omega-3 Fatty Acids (FISH OIL) 1000 MG CAPS, Take 1,000 mg by mouth daily with breakfast., Disp: , Rfl:    pantoprazole (PROTONIX) 40 MG tablet, Take 40 mg by mouth  daily., Disp: , Rfl:    Tetrahydrozoline HCl (VISINE OP), Place 1 drop into both eyes daily as needed (eye irritation)., Disp: , Rfl:    vitamin B-12 (CYANOCOBALAMIN) 500 MCG tablet, Take 500 mcg by mouth daily with breakfast., Disp: , Rfl:    vitamin C (ASCORBIC ACID) 500 MG tablet, Take 500 mg by mouth daily with lunch., Disp: , Rfl:   Past Medical History: Past Medical History:  Diagnosis Date   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      Exercise Target Goals: Exercise Program Goal: Individual exercise prescription set using results from initial 6 min walk test and THRR while considering  patients activity barriers and safety.   Exercise Prescription Goal: Initial exercise prescription builds to 30-45 minutes a day of aerobic activity, 2-3 days per week.  Home exercise guidelines will be given to patient during program as part of exercise prescription that the participant will acknowledge.   Education: Aerobic Exercise: - Group verbal and visual presentation on the components of exercise prescription. Introduces F.I.T.T principle from ACSM for exercise prescriptions.  Reviews F.I.T.T. principles of aerobic exercise including progression. Written material given at graduation. Flowsheet Row Cardiac Rehab from 04/04/2021 in [facility] Cardiac and Pulmonary Rehab  Education need identified 02/28/21       Education: Resistance Exercise: - Group verbal and visual presentation on the components of exercise prescription. Introduces F.I.T.T principle from ACSM for exercise prescriptions  Reviews F.I.T.T. principles of resistance exercise including progression. Written material given at graduation.    Education: Exercise & Equipment Safety: - Individual verbal instruction and demonstration of equipment use and safety with use of the equipment. Flowsheet Row Cardiac Rehab from 04/04/2021 in [facility] Cardiac and Pulmonary Rehab  Date 02/14/21  Educator [facility]  Instruction Review Code 1- Verbalizes Understanding       Education: Exercise Physiology & General Exercise Guidelines: - Group verbal and written instruction with models to review the exercise physiology of the  cardiovascular system and associated critical values. Provides general exercise guidelines with specific guidelines to those with heart or lung disease.  Flowsheet Row Cardiac Rehab from 04/04/2021 in [facility] Cardiac and Pulmonary Rehab  Education need identified 02/28/21  Date 04/04/21  Educator [facility]  Instruction Review Code 1- [employer] Understanding       Education: Flexibility, Balance, Mind/Body Relaxation: - Group verbal and visual presentation with interactive activity on the components of exercise prescription. Introduces F.I.T.T principle from ACSM for exercise prescriptions. Reviews F.I.T.T. principles of flexibility and balance exercise training including progression. Also discusses the mind body connection.  Reviews various relaxation techniques to help reduce and manage stress (i.e. Deep breathing, progressive muscle relaxation, and visualization). Balance handout provided to take home. Written material given at graduation.   Activity Barriers & Risk Stratification:  Activity Barriers & Cardiac Risk Stratification - 02/28/21 1146       Activity Barriers & Cardiac Risk Stratification  Activity Barriers Deconditioning;Muscular Weakness   legs feel weaker   Cardiac Risk Stratification Moderate             6 Minute Walk:  6 Minute Walk     Row Name 02/28/21 1145         6 Minute Walk   Phase Initial     Distance 1188 feet     Walk Time 6 minutes     # of Rest Breaks 0     MPH 2.25     METS 2.57     RPE 8     VO2 Peak 8.98     Symptoms No     Resting HR 58 bpm     Resting BP 136/70     Resting Oxygen Saturation  99 %     Exercise Oxygen Saturation  during 6 min walk 99 %     Max Ex. HR 89 bpm     Max Ex. BP 138/60     2 Minute Post BP 132/64              Oxygen Initial Assessment:   Oxygen Re-Evaluation:   Oxygen Discharge (Final Oxygen Re-Evaluation):   Initial Exercise Prescription:  Initial Exercise Prescription - 02/28/21 1100        Date of Initial Exercise RX and Referring Provider   Date 02/28/21    Referring Provider Donnelly Angelica MD      Oxygen   Maintain Oxygen Saturation 88% or higher      Treadmill   MPH 2    Grade 1    Minutes 15    METs 2.81      NuStep   Level 1    SPM 80    Minutes 15    METs 2      T5 Nustep   Level 1    SPM 80    Minutes 15    METs 2      Track   Laps 31    Minutes 15    METs 2.69      Prescription Details   Frequency (times per week) 3    Duration Progress to 30 minutes of continuous aerobic without signs/symptoms of physical distress      Intensity   THRR 40-80% of Max Heartrate 112-135    Ratings of Perceived Exertion 11-13    Perceived Dyspnea 0-4      Progression   Progression Continue to progress workloads to maintain intensity without signs/symptoms of physical distress.      Resistance Training   Training Prescription Yes    Weight 3 lb    Reps 10-15             Perform Capillary Blood Glucose checks as needed.  Exercise Prescription Changes:   Exercise Prescription Changes     Row Name 02/28/21 1100 03/15/21 0900 03/25/21 0900 04/09/21 1300       Response to Exercise   Blood Pressure (Admit) 136/70 -- 102/60 110/60    Blood Pressure (Exercise) 138/60 -- 156/70 --    Blood Pressure (Exit) 132/64 -- 122/64 110/62    Heart Rate (Admit) 58 bpm -- 69 bpm 91 bpm    Heart Rate (Exercise) 89 bpm -- 131 bpm 135 bpm    Heart Rate (Exit) 70 bpm -- 87 bpm 93 bpm    Oxygen Saturation (Admit) 99 % -- -- --    Oxygen Saturation (Exercise) 99 % -- -- --    Rating  of Perceived Exertion (Exercise) 8 -- 12 12    Symptoms none -- none none    Comments walk test results -- -- --    Duration -- -- Continue with 30 min of aerobic exercise without signs/symptoms of physical distress. Continue with 30 min of aerobic exercise without signs/symptoms of physical distress.    Intensity -- -- THRR unchanged THRR unchanged      Progression   Progression -- --  Continue to progress workloads to maintain intensity without signs/symptoms of physical distress. Continue to progress workloads to maintain intensity without signs/symptoms of physical distress.    Average METs -- -- 2.74 3      Resistance Training   Training Prescription -- -- Yes Yes    Weight -- -- 3 lb 3 lb    Reps -- -- 10-15 10-15      Interval Training   Interval Training -- -- No No      Treadmill   MPH -- -- 2.5 --    Grade -- -- 1 --    Minutes -- -- 15 --    METs -- -- 3.26 --      Recumbant Bike   Level -- -- 1 --    Minutes -- -- 15 --    METs -- -- 2.4 --      NuStep   Level -- -- 4 4    Minutes -- -- 15 15    METs -- -- 2.8 3.3      T5 Nustep   Level -- -- 1 --    Minutes -- -- 15 --    METs -- -- 2.5 --      Track   Laps -- -- 35 31    Minutes -- -- 15 15    METs -- -- 2.9 2.7      Home Exercise Plan   Plans to continue exercise at -- Home (comment)  walking, treadmill Home (comment)  walking, treadmill Home (comment)  walking, treadmill    Frequency -- Add 2 additional days to program exercise sessions. Add 2 additional days to program exercise sessions. Add 2 additional days to program exercise sessions.    Initial Home Exercises Provided -- 03/15/21 03/15/21 03/15/21      Oxygen   Maintain Oxygen Saturation -- 88% or higher 88% or higher --             Exercise Comments:   Exercise Comments     Row Name 03/05/21 7169           Exercise Comments First full day of exercise!  Patient was oriented to gym and equipment including functions, settings, policies, and procedures.  Patient's individual exercise prescription and treatment plan were reviewed.  All starting workloads were established based on the results of the 6 minute walk test done at initial orientation visit.  The plan for exercise progression was also introduced and progression will be customized based on patient's performance and goals.                Exercise Goals and  Review:   Exercise Goals     Row Name 02/28/21 1149             Exercise Goals   Increase Physical Activity Yes       Intervention Provide advice, education, support and counseling about physical activity/exercise needs.;Develop an individualized exercise prescription for aerobic and resistive training based on initial evaluation findings, risk stratification, comorbidities and participant's  personal goals.       Expected Outcomes Short Term: Attend rehab on a regular basis to increase amount of physical activity.;Long Term: Add in home exercise to make exercise part of routine and to increase amount of physical activity.;Long Term: Exercising regularly at least 3-5 days a week.       Increase Strength and Stamina Yes       Intervention Provide advice, education, support and counseling about physical activity/exercise needs.;Develop an individualized exercise prescription for aerobic and resistive training based on initial evaluation findings, risk stratification, comorbidities and participant's personal goals.       Expected Outcomes Short Term: Increase workloads from initial exercise prescription for resistance, speed, and METs.;Short Term: Perform resistance training exercises routinely during rehab and add in resistance training at home;Long Term: Improve cardiorespiratory fitness, muscular endurance and strength as measured by increased METs and functional capacity (6MWT)       Able to understand and use rate of perceived exertion (RPE) scale Yes       Intervention Provide education and explanation on how to use RPE scale       Expected Outcomes Short Term: Able to use RPE daily in rehab to express subjective intensity level;Long Term:  Able to use RPE to guide intensity level when exercising independently       Able to understand and use Dyspnea scale Yes       Intervention Provide education and explanation on how to use Dyspnea scale       Expected Outcomes Short Term: Able to use  Dyspnea scale daily in rehab to express subjective sense of shortness of breath during exertion;Long Term: Able to use Dyspnea scale to guide intensity level when exercising independently       Knowledge and understanding of Target Heart Rate Range (THRR) Yes       Intervention Provide education and explanation of THRR including how the numbers were predicted and where they are located for reference       Expected Outcomes Short Term: Able to state/look up THRR;Short Term: Able to use daily as guideline for intensity in rehab;Long Term: Able to use THRR to govern intensity when exercising independently       Able to check pulse independently Yes       Intervention Provide education and demonstration on how to check pulse in carotid and radial arteries.;Review the importance of being able to check your own pulse for safety during independent exercise       Expected Outcomes Short Term: Able to explain why pulse checking is important during independent exercise;Long Term: Able to check pulse independently and accurately       Understanding of Exercise Prescription Yes       Intervention Provide education, explanation, and written materials on patient's individual exercise prescription       Expected Outcomes Short Term: Able to explain program exercise prescription;Long Term: Able to explain home exercise prescription to exercise independently                Exercise Goals Re-Evaluation :  Exercise Goals Re-Evaluation     Row Name 03/05/21 0928 03/12/21 1332 03/15/21 0952 03/22/21 0937 03/25/21 0903     Exercise Goal Re-Evaluation   Exercise Goals Review Increase Physical Activity;Able to understand and use rate of perceived exertion (RPE) scale;Knowledge and understanding of Target Heart Rate Range (THRR);Understanding of Exercise Prescription;Increase Strength and Stamina;Able to understand and use Dyspnea scale;Able to check pulse independently Increase Physical Activity;Increase Strength and  Stamina Increase Physical Activity;Increase Strength and Stamina;Able to understand and use rate of perceived exertion (RPE) scale;Able to understand and use Dyspnea scale;Knowledge and understanding of Target Heart Rate Range (THRR);Able to check pulse independently;Understanding of Exercise Prescription Increase Physical Activity;Increase Strength and Stamina;Understanding of Exercise Prescription Increase Physical Activity;Increase Strength and Stamina;Understanding of Exercise Prescription   Comments Reviewed RPE and dyspnea scales, THR and program prescription with pt today.  Pt voiced understanding and was given a copy of goals to take home. Dariana is doing well and is learning how to use all the machines.  She is able to do prescribed workloads.  She has reached [facility] some sessions.  We will monitor progress. Reviewed home exercise with pt today.  Pt plans to walk and use treadmill at home for exercise.  Reviewed THR, pulse, RPE, sign and symptoms, pulse oximetery and when to call 911 or MD.  Also discussed weather considerations and indoor options.  Pt voiced understanding. Valorie is doing well in rehab. She doing well on her treadmill at home on her off days.  She will usually walk for about 30 min each time.  She is staring to feel better since her stent. Nanako is doing well in rehab.  She is now using the treadmill some at 2.5 mph.  She is also up to level 4 on the NuStep.  We will continue to montior her progress.   Expected Outcomes Short: Use RPE daily to regulate intensity. Long: Follow program prescription in THR. Short:  attend consistently Long: improve overall stamina Short: Start to add in exercise at home Long: Continue to improve stamina. Short: Continue to use treadmill on off days Long; Continue to improve stamina. Short: Increase T5 NuStep Long: Continue to improve stamina on treadmill    Row Name 04/09/21 1302             Exercise Goal Re-Evaluation   Exercise Goals Review  Increase Physical Activity;Increase Strength and Stamina       Comments Maresha attends consistently and works in her THR range.  She is slowly progressing workloads on TM and seated machines.  We will continue to monitor progress.       Expected Outcomes Short: maintain consistent attendance Long: improve average MET level                Discharge Exercise Prescription (Final Exercise Prescription Changes):  Exercise Prescription Changes - 04/09/21 1300       Response to Exercise   Blood Pressure (Admit) 110/60    Blood Pressure (Exit) 110/62    Heart Rate (Admit) 91 bpm    Heart Rate (Exercise) 135 bpm    Heart Rate (Exit) 93 bpm    Rating of Perceived Exertion (Exercise) 12    Symptoms none    Duration Continue with 30 min of aerobic exercise without signs/symptoms of physical distress.    Intensity THRR unchanged      Progression   Progression Continue to progress workloads to maintain intensity without signs/symptoms of physical distress.    Average METs 3      Resistance Training   Training Prescription Yes    Weight 3 lb    Reps 10-15      Interval Training   Interval Training No      NuStep   Level 4    Minutes 15    METs 3.3      Track   Laps 31    Minutes 15    METs 2.7  Home Exercise Plan   Plans to continue exercise at Home (comment)   walking, treadmill   Frequency Add 2 additional days to program exercise sessions.    Initial Home Exercises Provided 03/15/21             Nutrition:  Target Goals: Understanding of nutrition guidelines, daily intake of sodium <1543m, cholesterol <2016m calories 30% from fat and 7% or less from saturated fats, daily to have 5 or more servings of fruits and vegetables.  Education: All About Nutrition: -Group instruction provided by verbal, written material, interactive activities, discussions, models, and posters to present general guidelines for heart healthy nutrition including fat, fiber, MyPlate, the  role of sodium in heart healthy nutrition, utilization of the nutrition label, and utilization of this knowledge for meal planning. Follow up email sent as well. Written material given at graduation. [facility]  Education need identified 02/28/21       Biometrics:  Pre Biometrics - 02/28/21 1149       Pre Biometrics   Height 5' 4.4" (1.636 m)    Weight 147 lb 3.2 oz (66.8 kg)    BMI (Calculated) 24.95    Single Leg Stand 30 seconds              Nutrition Therapy Plan and Nutrition Goals:  Nutrition Therapy & Goals - 04/02/21 0838       Nutrition Therapy   Diet Heart healthy, low Na, T2DM    Drug/Food Interactions Statins/Certain Fruits    Protein (specify units) 50g    Fiber 25 grams    Whole Grain Foods 3 servings    Saturated Fats 12 max. grams    Fruits and Vegetables 8 servings/day    Sodium 1.5 grams      Personal Nutrition Goals   Nutrition Goal ST: try using vinegar and/or citrus to add flavor while reducing salt (<1/4 tsp when adding to meals), taste food before salting LT: A1c </= 7, saturated fat <12g/day, Na <1.5g/day, follow MyPlate structure with most meals.    Comments [age].o. F admitted to rehab s/p stent placement presenting with CAD, T2DM, hypothyroidism, HLD, anemia, osteoporosis. Relevant medications include vitamin C, lipitor, calcium/vit D supplement, B12. iron, magnesium, MVI, omega-3, protonix,  glipizide, metformin, synthroid. PYP 69. B: greek yogurt or cottage cheese with fruit and granola S: protein drink before rehab (30g) L: tuna with onions and pickles on crackers or tomato soup or brunswick stew or pimento cheese sandwich or salad when out to eat or baked fish D: same as lunch. She eats lost of different beans w/ vegetable oil. She does not usually salt while cooking. She feels that salt is her weakness, she estimates using 1.5 tsp salt per day in added salt. Drinks: she reports  not drinking much (she does not like water) - splenda sweetened iced tea or flavored water. She got mrs. dash, but she does not feel it tastes good. Discussed heart healthy eating and diabetes friendly eating.      Intervention Plan   Intervention Prescribe, educate and counsel regarding individualized specific dietary modifications aiming towards targeted core components such as weight, hypertension, lipid management, diabetes, heart failure and other comorbidities.    Expected Outcomes Short Term Goal: Understand basic principles of dietary content, such as calories, fat, sodium, cholesterol and nutrients.;Short Term Goal: A plan has been developed with personal nutrition goals set during dietitian appointment.;Long Term Goal: Adherence to prescribed nutrition  plan.             Nutrition Assessments:  MEDIFICTS Score Key: ?70 Need to make dietary changes  40-70 Heart Healthy Diet ? 40 Therapeutic Level Cholesterol Diet  Flowsheet Row Cardiac Rehab from 02/28/2021 in [facility] Cardiac and Pulmonary Rehab  Picture Your Plate Total Score on Admission 69      Picture Your Plate Scores: <00 Unhealthy dietary pattern with much room for improvement. 41-50 Dietary pattern unlikely to meet recommendations for good health and room for improvement. 51-60 More healthful dietary pattern, with some room for improvement.  >60 Healthy dietary pattern, although there may be some specific behaviors that could be improved.    Nutrition Goals Re-Evaluation:  Nutrition Goals Re-Evaluation     [place] Name 03/19/21 [phone]             Goals   Comment No goals have been established yet as she has not met with the RD. Appt on 12/13.       Expected Outcome Short: Meet with RD Long: Establish heart healthy diet                Nutrition Goals Discharge (Final Nutrition Goals Re-Evaluation):  Nutrition Goals Re-Evaluation - 03/19/21 0929       Goals   Comment No goals have been established yet as  she has not met with the RD. Appt on 12/13.    Expected Outcome Short: Meet with RD Long: Establish heart healthy diet             Psychosocial: Target Goals: Acknowledge presence or absence of significant depression and/or stress, maximize coping skills, provide positive support system. Participant is able to verbalize types and ability to use techniques and skills needed for reducing stress and depression.   Education: Stress, Anxiety, and Depression - Group verbal and visual presentation to define topics covered.  Reviews how body is impacted by stress, anxiety, and depression.  Also discusses healthy ways to reduce stress and to treat/manage anxiety and depression.  Written material given at graduation. Flowsheet Row Cardiac Rehab from 04/04/2021 in [facility] Cardiac and Pulmonary Rehab  Date 03/28/21  Educator [facility]  Instruction Review Code 1- Verbalizes Understanding       Education: Sleep Hygiene -Provides group verbal and written instruction about how sleep can affect your health.  Define sleep hygiene, discuss sleep cycles and impact of sleep habits. Review good sleep hygiene tips.    Initial Review & Psychosocial Screening:  Initial Psych Review & Screening - 02/14/21 0948       Initial Review   Current issues with None Identified      Family Dynamics   Good Support System? Yes    Comments Zea can look to her son, husband and life group fro support. She has a positive outlook on her health.      Barriers   Psychosocial barriers to participate in program The patient should benefit from training in stress management and relaxation.;There are no identifiable barriers or psychosocial needs.      Screening Interventions   Interventions Encouraged to exercise;To provide support and resources with identified psychosocial needs;Provide feedback about the scores to participant    Expected Outcomes Short Term goal: Utilizing psychosocial counselor, staff and physician to assist  with identification of specific Stressors or current issues interfering with healing process. Setting desired goal for each stressor or current issue identified.;Long Term Goal: Stressors or current issues are controlled or eliminated.;Short Term goal: Identification and review with  participant of any Quality of Life or Depression concerns found by scoring the questionnaire.;Long Term goal: The participant improves quality of Life and PHQ9 Scores as seen by post scores and/or verbalization of changes             Quality of Life Scores:   Quality of Life - 02/28/21 1149       Quality of Life   Select Quality of Life      Quality of Life Scores   Health/Function Pre 28.6 %    Socioeconomic Pre 26.43 %    Psych/Spiritual Pre 29.64 %    Family Pre 27.6 %    GLOBAL Pre 28.22 %            Scores of 19 and below usually indicate a poorer quality of life in these areas.  A difference of  2-3 points is a clinically meaningful difference.  A difference of 2-3 points in the total score of the Quality of Life Index has been associated with significant improvement in overall quality of life, self-image, physical symptoms, and general health in studies assessing change in quality of life.  PHQ-9: Recent Review Flowsheet Data     Depression screen [facility] 2/9 02/28/2021   Decreased Interest 0   Down, Depressed, Hopeless 0   PHQ - 2 Score 0   Altered sleeping 0   Tired, decreased energy 0   Change in appetite 0   Feeling bad or failure about yourself  0   Trouble concentrating 0   Moving slowly or fidgety/restless 0   Suicidal thoughts 0   PHQ-9 Score 0   Difficult doing work/chores Not difficult at all      Interpretation of Total Score  Total Score Depression Severity:  1-4 = Minimal depression, 5-9 = Mild depression, 10-14 = Moderate depression, 15-19 = Moderately severe depression, 20-27 = Severe depression   Psychosocial Evaluation and Intervention:  Psychosocial Evaluation -  02/14/21 0950       Psychosocial Evaluation & Interventions   Interventions Encouraged to exercise with the program and follow exercise prescription;Relaxation education;Stress management education    Comments Merryl can look to her son, husband and life group fro support. She has a positive outlook on her health.    Expected Outcomes Short: Start [facility] to help with mood. Long: Maintain a healthy mental state    Continue Psychosocial Services  Follow up required by staff             Psychosocial Re-Evaluation:  Psychosocial Re-Evaluation     [place] Name 03/19/21 [phone]             Psychosocial Re-Evaluation   Current issues with None Identified       Comments Jeilyn is doing well mentally. She denies any big stressor or "drama". No medications for depression or anxiety. Her biggest support is her husband, pastor's wife, and a couple local friends. She has 2 Sons and 2 grandchildren that she sees periodically. She is enjoying the program thus far.       Expected Outcomes Short: Continue routine attendance Long: Utilize exercise for stress management and maintain positive attitude       Interventions Encouraged to attend Cardiac Rehabilitation for the exercise       Continue Psychosocial Services  Follow up required by staff                Psychosocial Discharge (Final Psychosocial Re-Evaluation):  Psychosocial Re-Evaluation - 03/19/21 3300  Psychosocial Re-Evaluation   Current issues with None Identified    Comments Hannahgrace is doing well mentally. She denies any big stressor or "drama". No medications for depression or anxiety. Her biggest support is her husband, pastor's wife, and a couple local friends. She has 2 Sons and 2 grandchildren that she sees periodically. She is enjoying the program thus far.    Expected Outcomes Short: Continue routine attendance Long: Utilize exercise for stress management and maintain positive attitude    Interventions Encouraged to  attend Cardiac Rehabilitation for the exercise    Continue Psychosocial Services  Follow up required by staff             Vocational Rehabilitation: Provide vocational rehab assistance to qualifying candidates.   Vocational Rehab Evaluation & Intervention:   Education: Education Goals: Education classes will be provided on a variety of topics geared toward better understanding of heart health and risk factor modification. Participant will state understanding/return demonstration of topics presented as noted by education test scores.  Learning Barriers/Preferences:  Learning Barriers/Preferences - 02/14/21 0947       Learning Barriers/Preferences   Learning Barriers None    Learning Preferences None             General Cardiac Education Topics:  AED/CPR: - Group verbal and written instruction with the use of models to demonstrate the basic use of the AED with the basic ABC's of resuscitation.   Anatomy and Cardiac Procedures: - Group verbal and visual presentation and models provide information about basic cardiac anatomy and function. Reviews the testing methods done to diagnose heart disease and the outcomes of the test results. Describes the treatment choices: Medical Management, Angioplasty, or Coronary Bypass Surgery for treating various heart conditions including Myocardial Infarction, Angina, Valve Disease, and Cardiac Arrhythmias.  Written material given at graduation.   Medication Safety: - Group verbal and visual instruction to review commonly prescribed medications for heart and lung disease. Reviews the medication, class of the drug, and side effects. Includes the steps to properly store meds and maintain the prescription regimen.  Written material given at graduation. Flowsheet Row Cardiac Rehab from 04/04/2021 in [facility] Cardiac and Pulmonary Rehab  Date 03/05/21  Educator SB  Instruction Review Code 1- Verbalizes Understanding       Intimacy: - Group  verbal instruction through game format to discuss how heart and lung disease can affect sexual intimacy. Written material given at graduation..   Know Your Numbers and Heart Failure: - Group verbal and visual instruction to discuss disease risk factors for cardiac and pulmonary disease and treatment options.  Reviews associated critical values for Overweight/Obesity, Hypertension, Cholesterol, and Diabetes.  Discusses basics of heart failure: signs/symptoms and treatments.  Introduces Heart Failure Zone chart for action plan for heart failure.  Written material given at graduation. Flowsheet Row Cardiac Rehab from 04/04/2021 in [facility] Cardiac and Pulmonary Rehab  Education need identified 02/28/21  Date 03/14/21  Educator SB  Instruction Review Code 1- Verbalizes Understanding       Infection Prevention: - Provides verbal and written material to individual with discussion of infection control including proper hand washing and proper equipment cleaning during exercise session. Flowsheet Row Cardiac Rehab from 04/04/2021 in [facility] Cardiac and Pulmonary Rehab  Date 02/14/21  Educator [facility]  Instruction Review Code 1- Verbalizes Understanding       Falls Prevention: - Provides verbal and written material to individual with discussion of falls prevention and safety. Flowsheet Row Cardiac Rehab from 04/04/2021 in  [place] Cardiac and Pulmonary Rehab  Date 02/14/21  Educator [facility]  Instruction Review Code 1- Verbalizes Understanding       Other: -Provides group and verbal instruction on various topics (see comments)   Knowledge Questionnaire Score:  Knowledge Questionnaire Score - 02/28/21 1150       Knowledge Questionnaire Score   Pre Score 22/26             Core Components/Risk Factors/Patient Goals at Admission:  Personal Goals and Risk Factors at Admission - 02/28/21 1151       Core Components/Risk Factors/Patient Goals on Admission    Weight Management Yes;Weight Loss     Intervention Weight Management: Develop a combined nutrition and exercise program designed to reach desired caloric intake, while maintaining appropriate intake of nutrient and fiber, sodium and fats, and appropriate energy expenditure required for the weight goal.;Weight Management/Obesity: Establish reasonable short term and long term weight goals.;Weight Management: Provide education and appropriate resources to help participant work on and attain dietary goals.    Admit Weight 147 lb 3.2 oz (66.8 kg)    Goal Weight: Short Term 142 lb (64.4 kg)    Goal Weight: Long Term 135 lb (61.2 kg)    Expected Outcomes Short Term: Continue to assess and modify interventions until short term weight is achieved;Long Term: Adherence to nutrition and physical activity/exercise program aimed toward attainment of established weight goal;Weight Loss: Understanding of general recommendations for a balanced deficit meal plan, which promotes 1-2 lb weight loss per week and includes a negative energy balance of [phone] kcal/d;Understanding recommendations for meals to include 15-35% energy as protein, 25-35% energy from fat, 35-60% energy from carbohydrates, less than 228m of dietary cholesterol, 20-35 gm of total fiber daily;Understanding of distribution of calorie intake throughout the day with the consumption of 4-5 meals/snacks    Diabetes Yes    Intervention Provide education about signs/symptoms and action to take for hypo/hyperglycemia.;Provide education about proper nutrition, including hydration, and aerobic/resistive exercise prescription along with prescribed medications to achieve blood glucose in normal ranges: Fasting glucose 65-99 mg/dL    Expected Outcomes Short Term: Participant verbalizes understanding of the signs/symptoms and immediate care of hyper/hypoglycemia, proper foot care and importance of medication, aerobic/resistive exercise and nutrition plan for blood glucose control.;Long Term: Attainment of  HbA1C < 7%.    Hypertension Yes    Intervention Provide education on lifestyle modifcations including regular physical activity/exercise, weight management, moderate sodium restriction and increased consumption of fresh fruit, vegetables, and low fat dairy, alcohol moderation, and smoking cessation.;Monitor prescription use compliance.    Expected Outcomes Short Term: Continued assessment and intervention until BP is < 140/962mHG in hypertensive participants. < 130/8059mG in hypertensive participants with diabetes, heart failure or chronic kidney disease.;Long Term: Maintenance of blood pressure at goal levels.    Lipids Yes    Intervention Provide education and support for participant on nutrition & aerobic/resistive exercise along with prescribed medications to achieve LDL <20m26mDL >40mg7m Expected Outcomes Short Term: Participant states understanding of desired cholesterol values and is compliant with medications prescribed. Participant is following exercise prescription and nutrition guidelines.;Long Term: Cholesterol controlled with medications as prescribed, with individualized exercise RX and with personalized nutrition plan. Value goals: LDL < 20mg,18m > 40 mg.             Education:Diabetes - Individual verbal and written instruction to review signs/symptoms of diabetes, desired ranges of glucose level fasting, after meals and with exercise. Acknowledge  that pre and post exercise glucose checks will be done for 3 sessions at entry of program. [place] from 04/04/2021 in [facility] Cardiac and Pulmonary Rehab  Date 02/14/21  Educator [facility]  Instruction Review Code 1- Verbalizes Understanding       Core Components/Risk Factors/Patient Goals Review:   Goals and Risk Factor Review     Row Name 03/19/21 0930             Core Components/Risk Factors/Patient Goals Review   Personal Goals Review Diabetes;Weight Management/Obesity;Hypertension       Review Alasha  is doing well and staying compliant with all her medications. She takes her blood sugars twice/day- denies any abnormal low or high sugars. She is aware of her symptoms if anything falls into the abnormal range. She would like to lose weight and her goal is to reach 125 lb and looking for diet changes to help achieve that. She weighs herself everyday. She has a BP cuff at home but does not check her BPs- we talked about checking them periodically to ensure they are staying in stable ranges.       Expected Outcomes Short: Check BP at home Long: Continue to manage lifestyle risk factors                Core Components/Risk Factors/Patient Goals at Discharge (Final Review):   Goals and Risk Factor Review - 03/19/21 0930       Core Components/Risk Factors/Patient Goals Review   Personal Goals Review Diabetes;Weight Management/Obesity;Hypertension    Review Elizabet is doing well and staying compliant with all her medications. She takes her blood sugars twice/day- denies any abnormal low or high sugars. She is aware of her symptoms if anything falls into the abnormal range. She would like to lose weight and her goal is to reach 125 lb and looking for diet changes to help achieve that. She weighs herself everyday. She has a BP cuff at home but does not check her BPs- we talked about checking them periodically to ensure they are staying in stable ranges.    Expected Outcomes Short: Check BP at home Long: Continue to manage lifestyle risk factors             ITP Comments:  ITP Comments     Row Name 02/14/21 0947 02/28/21 1144 03/05/21 0927 03/13/21 0740 04/02/21 0922   ITP Comments Virtual Visit completed. Patient informed on EP and RD appointment and 6 Minute walk test. Patient also informed of patient health questionnaires on My Chart. Patient Verbalizes understanding. Visit diagnosis can be found in [facility] 01/30/2021. Completed 6MWT and gym orientation. Initial ITP created and sent for review to Dr.  Emily Filbert, Medical Director. First full day of exercise!  Patient was oriented to gym and equipment including functions, settings, policies, and procedures.  Patient's individual exercise prescription and treatment plan were reviewed.  All starting workloads were established based on the results of the 6 minute walk test done at initial orientation visit.  The plan for exercise progression was also introduced and progression will be customized based on patient's performance and goals. 30 Day review completed. Medical Director ITP review done, changes made as directed, and signed approval by Medical Director.    New to program Completed intial RD Consultation    [place] Name 04/10/21 0832           ITP Comments 30 Day review completed. Medical Director ITP review done, changes made as directed, and signed approval  by Medical Director.                Comments:

## 2021-04-11 ENCOUNTER — Other Ambulatory Visit: Payer: Self-pay

## 2021-04-11 DIAGNOSIS — Z955 Presence of coronary angioplasty implant and graft: Secondary | ICD-10-CM

## 2021-04-11 NOTE — Progress Notes (Signed)
Daily Session Note  Patient Details  Name: [name] MRN: [id number] Date of Birth: 1945-06-26 Referring Provider:   Flowsheet Row Cardiac Rehab from 02/28/2021 in [facility] Cardiac and Pulmonary Rehab  Referring Provider Donnelly Angelica MD       Encounter Date: 04/11/2021  Check In:  Session Check In - 04/11/21 1039       Check-In   Supervising physician immediately available to respond to emergencies See telemetry face sheet for immediately available ER MD    Location [facility]-Cardiac & Pulmonary Rehab    Staff Present Will Bonnet, RN,BC,MSN;Amanda Oletta Darter, [place], ACSM CEP, Exercise Physiologist;Melissa Caiola, RDN, LDN    Virtual Visit No    Medication changes reported     No    Fall or balance concerns reported    No    Warm-up and Cool-down Performed on first and last piece of equipment    Resistance Training Performed Yes    VAD Patient? No    PAD/SET Patient? No      Pain Assessment   Currently in Pain? No/denies                Social History   Tobacco Use  Smoking Status Former   Packs/day: 0.50   Years: 10.00   Pack years: 5.00   Types: Cigarettes   Quit date: 04/14/1998   Years since quitting: 23.0  Smokeless Tobacco Never    Goals Met:  Independence with exercise equipment Exercise tolerated well No report of concerns or symptoms today Strength training completed today  Goals Unmet:  Not Applicable  Comments: Pt able to follow exercise prescription today without complaint.  Will continue to monitor for progression.    Dr. Emily Filbert is Medical Director for [place].  Dr. Ottie Glazier is Medical Director for [facility] Pulmonary Rehabilitation.

## 2021-04-12 ENCOUNTER — Other Ambulatory Visit: Payer: Self-pay

## 2021-04-12 ENCOUNTER — Encounter: Payer: Medicare Other | Admitting: *Deleted

## 2021-04-12 DIAGNOSIS — Z955 Presence of coronary angioplasty implant and graft: Secondary | ICD-10-CM | POA: Diagnosis not present

## 2021-04-12 NOTE — Progress Notes (Signed)
Daily Session Note  Patient Details  Name: [name] MRN: [id number] Date of Birth: 09-19-1945 Referring Provider:   Flowsheet Row Cardiac Rehab from 02/28/2021 in [facility] Cardiac and Pulmonary Rehab  Referring Provider Donnelly Angelica MD       Encounter Date: 04/12/2021  Check In:  Session Check In - 04/12/21 0916       Check-In   Supervising physician immediately available to respond to emergencies See telemetry face sheet for immediately available ER MD    Location [facility]-Cardiac & Pulmonary Rehab    Staff Present Renita Papa, RN BSN;Joseph [place], RCP,RRT,BSRT;Jessica [place], [place], RCEP, CCRP, CCET    Virtual Visit No    Medication changes reported     No    Fall or balance concerns reported    No    Warm-up and Cool-down Performed on first and last piece of equipment    Resistance Training Performed Yes    VAD Patient? No    PAD/SET Patient? No      Pain Assessment   Currently in Pain? No/denies                Social History   Tobacco Use  Smoking Status Former   Packs/day: 0.50   Years: 10.00   Pack years: 5.00   Types: Cigarettes   Quit date: 04/14/1998   Years since quitting: 23.0  Smokeless Tobacco Never    Goals Met:  Independence with exercise equipment Exercise tolerated well No report of concerns or symptoms today Strength training completed today  Goals Unmet:  Not Applicable  Comments: Pt able to follow exercise prescription today without complaint.  Will continue to monitor for progression.    Dr. Emily Filbert is Medical Director for [place].  Dr. Ottie Glazier is Medical Director for [facility] Pulmonary Rehabilitation.

## 2021-04-16 ENCOUNTER — Encounter: Payer: Medicare Other | Attending: Cardiology

## 2021-04-16 ENCOUNTER — Other Ambulatory Visit: Payer: Self-pay

## 2021-04-16 DIAGNOSIS — Z955 Presence of coronary angioplasty implant and graft: Secondary | ICD-10-CM | POA: Diagnosis present

## 2021-04-16 NOTE — Progress Notes (Signed)
Daily Session Note  Patient Details  Name: DENEAN PAVON MRN: [id number] Date of Birth: 12/23/1945 Referring Provider:   Flowsheet Row Cardiac Rehab from 02/28/2021 in [facility] Cardiac and Pulmonary Rehab  Referring Provider Donnelly Angelica MD       Encounter Date: 04/16/2021  Check In:  Session Check In - 04/16/21 0913       Check-In   Supervising physician immediately available to respond to emergencies See telemetry face sheet for immediately available ER MD    Location [facility]-Cardiac & Pulmonary Rehab    Staff Present Birdie Sons, MPA, RN;Amanda Oletta Darter, BA, ACSM CEP, Exercise Physiologist;Jessica Luan [place], MA, RCEP, CCRP, CCET    Virtual Visit No    Medication changes reported     No    Fall or balance concerns reported    No    Tobacco Cessation No Change    Warm-up and Cool-down Performed on first and last piece of equipment    Resistance Training Performed Yes    VAD Patient? No    PAD/SET Patient? No      Pain Assessment   Currently in Pain? No/denies                Social History   Tobacco Use  Smoking Status Former   Packs/day: 0.50   Years: 10.00   Pack years: 5.00   Types: Cigarettes   Quit date: 04/14/1998   Years since quitting: 23.0  Smokeless Tobacco Never    Goals Met:  Independence with exercise equipment Exercise tolerated well No report of concerns or symptoms today Strength training completed today  Goals Unmet:  Not Applicable  Comments: Pt able to follow exercise prescription today without complaint.  Will continue to monitor for progression.    Dr. Emily Filbert is Medical Director for [place].  Dr. Ottie Glazier is Medical Director for [facility] Pulmonary Rehabilitation.

## 2021-04-18 ENCOUNTER — Other Ambulatory Visit: Payer: Self-pay

## 2021-04-18 DIAGNOSIS — Z955 Presence of coronary angioplasty implant and graft: Secondary | ICD-10-CM

## 2021-04-18 NOTE — Progress Notes (Signed)
Daily Session Note  Patient Details  Name: [name] MRN: [id number] Date of Birth: [birth year] Referring Provider:   Flowsheet Row Cardiac Rehab from 02/28/2021 in [facility] Cardiac and Pulmonary Rehab  Referring Provider Donnelly Angelica MD       Encounter Date: 04/18/2021  Check In:  Session Check In - 04/18/21 0918       Check-In   Supervising physician immediately available to respond to emergencies See telemetry face sheet for immediately available ER MD    Location [facility]-Cardiac & Pulmonary Rehab    Staff Present Birdie Sons, MPA, RN;Amanda Oletta Darter, BA, ACSM CEP, Exercise Physiologist;Devonia Farro Amedeo Plenty, BS, ACSM CEP, Exercise Physiologist    Virtual Visit No    Medication changes reported     No    Fall or balance concerns reported    No    Tobacco Cessation No Change    Warm-up and Cool-down Performed on first and last piece of equipment    Resistance Training Performed Yes    VAD Patient? No    PAD/SET Patient? No      Pain Assessment   Currently in Pain? No/denies                Social History   Tobacco Use  Smoking Status Former   Packs/day: 0.50   Years: 10.00   Pack years: 5.00   Types: Cigarettes   Quit date: 04/14/1998   Years since quitting: 23.0  Smokeless Tobacco Never    Goals Met:  Independence with exercise equipment Exercise tolerated well No report of concerns or symptoms today Strength training completed today  Goals Unmet:  Not Applicable  Comments: Pt able to follow exercise prescription today without complaint.  Will continue to monitor for progression.    Dr. Emily Filbert is Medical Director for [place].  Dr. Ottie Glazier is Medical Director for [facility] Pulmonary Rehabilitation.

## 2021-04-19 ENCOUNTER — Encounter: Payer: Medicare Other | Admitting: *Deleted

## 2021-04-19 ENCOUNTER — Other Ambulatory Visit: Payer: Self-pay

## 2021-04-19 DIAGNOSIS — Z955 Presence of coronary angioplasty implant and graft: Secondary | ICD-10-CM | POA: Diagnosis not present

## 2021-04-19 NOTE — Progress Notes (Signed)
Daily Session Note  Patient Details  Name: [name] MRN: [id number] Date of Birth: 12/29/1945 Referring Provider:   Flowsheet Row Cardiac Rehab from 02/28/2021 in [facility] Cardiac and Pulmonary Rehab  Referring Provider Donnelly Angelica MD       Encounter Date: 04/19/2021  Check In:  Session Check In - 04/19/21 0919       Check-In   Supervising physician immediately available to respond to emergencies See telemetry face sheet for immediately available ER MD    Location [facility]-Cardiac & Pulmonary Rehab    Staff Present Renita Papa, RN BSN;[place], RCP,RRT,BSRT;Jessica Ov[place], [place], RCEP, CCRP, CCET    Virtual Visit No    Medication changes reported     No    Fall or balance concerns reported    No    Warm-up and Cool-down Performed on first and last piece of equipment    Resistance Training Performed Yes    VAD Patient? No    PAD/SET Patient? No      Pain Assessment   Currently in Pain? No/denies                Social History   Tobacco Use  Smoking Status Former   Packs/day: 0.50   Years: 10.00   Pack years: 5.00   Types: Cigarettes   Quit date: 04/14/1998   Years since quitting: 23.0  Smokeless Tobacco Never    Goals Met:  Independence with exercise equipment Exercise tolerated well No report of concerns or symptoms today Strength training completed today  Goals Unmet:  Not Applicable  Comments: Pt able to follow exercise prescription today without complaint.  Will continue to monitor for progression.    Dr. Emily Filbert is Medical Director for [place].  Dr. Ottie Glazier is Medical Director for [facility] Pulmonary Rehabilitation.

## 2021-04-23 ENCOUNTER — Other Ambulatory Visit: Payer: Self-pay

## 2021-04-23 DIAGNOSIS — Z955 Presence of coronary angioplasty implant and graft: Secondary | ICD-10-CM | POA: Diagnosis not present

## 2021-04-23 NOTE — Progress Notes (Signed)
Daily Session Note  Patient Details  Name: [name] MRN: [id number] Date of Birth: 06-06-1945 Referring Provider:   Flowsheet Row Cardiac Rehab from 02/28/2021 in [facility] Cardiac and Pulmonary Rehab  Referring Provider Donnelly Angelica MD       Encounter Date: 04/23/2021  Check In:  Session Check In - 04/23/21 0909       Check-In   Supervising physician immediately available to respond to emergencies See telemetry face sheet for immediately available ER MD    Location [facility]Card[facility] Pul[facility] Re[facility]    Staff Present Birdie Sons, MPA, RN;Amanda Oletta Darter, BA, ACSM CEP, Exercise Physiologist;Jessica Balta, MA, RCEP, CCRP, CCET    Virtual Visit No    Medication changes reported     No    Fall or balance concerns reported    No    Tobacco Cessation No Change    Warm-up and Cool-down Performed on first and last piece of equipment    Resistance Training Performed Yes    VAD Patient? No    PAD/SET Patient? No      Pain Assessment   Currently in Pain? No/denies                Social History   Tobacco Use  Smoking Status Former   Packs/day: 0.50   Years: 10.00   Pack years: 5.00   Types: Cigarettes   Quit date: 04/14/1998   Years since quitting: 23.0  Smokeless Tobacco Never    Goals Met:  Independence with exercise equipment Exercise tolerated well No report of concerns or symptoms today Strength training completed today  Goals Unmet:  Not Applicable  Comments: Pt able to follow exercise prescription today without complaint.  Will continue to monitor for progression.    Dr. Emily Filbert is Medical Director for [place].  Dr. Ottie Glazier is Medical Director for [facility] Pulmonary Rehabilitation.

## 2021-04-25 ENCOUNTER — Other Ambulatory Visit: Payer: Self-pay

## 2021-04-25 DIAGNOSIS — Z955 Presence of coronary angioplasty implant and graft: Secondary | ICD-10-CM | POA: Diagnosis not present

## 2021-04-25 NOTE — Progress Notes (Signed)
Daily Session Note  Patient Details  Name: [name] MRN: [id number] Date of Birth: 30-Dec-1945 Referring Provider:   Flowsheet Row Cardiac Rehab from 02/28/2021 in [facility] Cardiac and Pulmonary Rehab  Referring Provider Donnelly Angelica MD       Encounter Date: 04/25/2021  Check In:  Session Check In - 04/25/21 0916       Check-In   Supervising physician immediately available to respond to emergencies See telemetry face sheet for immediately available ER MD    Location [facility] Pul[facility] Re[facility]    Staff Present Birdie Sons, MPA, RN;Melissa [place], RDN, LDN;Jessica Hawkins, MA, RCEP, CCRP, CCET    Virtual Visit No    Medication changes reported     No    Fall or balance concerns reported    No    Tobacco Cessation No Change    Warm-up and Cool-down Performed on first and last piece of equipment    Resistance Training Performed Yes    VAD Patient? No    PAD/SET Patient? No      Pain Assessment   Currently in Pain? No/denies                Social History   Tobacco Use  Smoking Status Former   Packs/day: 0.50   Years: 10.00   Pack years: 5.00   Types: Cigarettes   Quit date: 04/14/1998   Years since quitting: 23.0  Smokeless Tobacco Never    Goals Met:  Independence with exercise equipment Exercise tolerated well No report of concerns or symptoms today Strength training completed today  Goals Unmet:  Not Applicable  Comments: Pt able to follow exercise prescription today without complaint.  Will continue to monitor for progression.    Dr. Emily Filbert is Medical Director for [place].  Dr. Ottie Glazier is Medical Director for [facility] Pulmonary Rehabilitation.

## 2021-04-26 ENCOUNTER — Encounter: Payer: Medicare Other | Admitting: *Deleted

## 2021-04-26 DIAGNOSIS — Z955 Presence of coronary angioplasty implant and graft: Secondary | ICD-10-CM | POA: Diagnosis not present

## 2021-04-26 NOTE — Progress Notes (Signed)
Daily Session Note  Patient Details  Name: [name] MRN: [id number] Date of Birth: 05/31/1945 Referring Provider:   Flowsheet Row Cardiac Rehab from 02/28/2021 in [facility] Cardiac and Pulmonary Rehab  Referring Provider Donnelly Angelica MD       Encounter Date: 04/26/2021  Check In:  Session Check In - 04/26/21 0913       Check-In   Supervising physician immediately available to respond to emergencies See telemetry face sheet for immediately available ER MD    Location [facility] Pul[facility] Re[facility]    Staff Present Renita Papa, RN BSN;Joseph [place], RCP,RRT,BSRT;Jessica [place], [place], RCEP, CCRP, CCET    Virtual Visit No    Medication changes reported     No    Fall or balance concerns reported    No    Warm-up and Cool-down Performed on first and last piece of equipment    Resistance Training Performed Yes    VAD Patient? No    PAD/SET Patient? No      Pain Assessment   Currently in Pain? No/denies                Social History   Tobacco Use  Smoking Status Former   Packs/day: 0.50   Years: 10.00   Pack years: 5.00   Types: Cigarettes   Quit date: 04/14/1998   Years since quitting: 23.0  Smokeless Tobacco Never    Goals Met:  Independence with exercise equipment Exercise tolerated well No report of concerns or symptoms today Strength training completed today  Goals Unmet:  Not Applicable  Comments: Pt able to follow exercise prescription today without complaint.  Will continue to monitor for progression.    Dr. Emily Filbert is Medical Director for [place].  Dr. Ottie Glazier is Medical Director for [facility] Pulmonary Rehabilitation.

## 2021-04-30 ENCOUNTER — Other Ambulatory Visit: Payer: Self-pay

## 2021-04-30 VITALS — Ht 64.4 in | Wt 144.3 lb

## 2021-04-30 DIAGNOSIS — Z955 Presence of coronary angioplasty implant and graft: Secondary | ICD-10-CM | POA: Diagnosis not present

## 2021-04-30 NOTE — Progress Notes (Signed)
Daily Session Note  Patient Details  Name: [name] MRN: [id number] Date of Birth: 11/11/1945 Referring Provider:   Flowsheet Row Cardiac Rehab from 02/28/2021 in [facility] Cardiac and Pulmonary Rehab  Referring Provider Donnelly Angelica MD       Encounter Date: 04/30/2021  Check In:  Session Check In - 04/30/21 0920       Check-In   Supervising physician immediately available to respond to emergencies See telemetry face sheet for immediately available ER MD    Location [facility] Pul[facility] Re[facility]    Staff Present Birdie Sons, MPA, RN;Amanda Sommer, BA, ACSM CEP, Exercise Physiologist    Virtual Visit No    Medication changes reported     No    Fall or balance concerns reported    No    Tobacco Cessation No Change    Warm-up and Cool-down Performed on first and last piece of equipment    Resistance Training Performed Yes    VAD Patient? No    PAD/SET Patient? No      Pain Assessment   Currently in Pain? No/denies                Social History   Tobacco Use  Smoking Status Former   Packs/day: 0.50   Years: 10.00   Pack years: 5.00   Types: Cigarettes   Quit date: 04/14/1998   Years since quitting: 23.0  Smokeless Tobacco Never    Goals Met:  Independence with exercise equipment Exercise tolerated well No report of concerns or symptoms today Strength training completed today  Goals Unmet:  Not Applicable  Comments: Pt able to follow exercise prescription today without complaint.  Will continue to monitor for progression.    Dr. Emily Filbert is Medical Director for [place].  Dr. Ottie Glazier is Medical Director for [facility] Pulmonary Rehabilitation.

## 2021-05-02 ENCOUNTER — Other Ambulatory Visit: Payer: Self-pay

## 2021-05-02 DIAGNOSIS — Z955 Presence of coronary angioplasty implant and graft: Secondary | ICD-10-CM | POA: Diagnosis not present

## 2021-05-02 NOTE — Progress Notes (Signed)
Daily Session Note  Patient Details  Name: [name] MRN: [id number] Date of Birth: October 22, 1945 Referring Provider:   Flowsheet Row Cardiac Rehab from 02/28/2021 in [facility] Cardiac and Pulmonary Rehab  Referring Provider Donnelly Angelica MD       Encounter Date: 05/02/2021  Check In:  Session Check In - 05/02/21 0914       Check-In   Supervising physician immediately available to respond to emergencies See telemetry face sheet for immediately available ER MD    Location [facility]Card[facility] Pulmonary Rehab    Staff Present Birdie Sons, MPA, RN;Jessica Luan [place], MA, RCEP, CCRP, CCET;Amanda Sommer, BA, ACSM CEP, Exercise Physiologist    Virtual Visit No    Medication changes reported     No    Fall or balance concerns reported    No    Tobacco Cessation No Change    Warm-up and Cool-down Performed on first and last piece of equipment    Resistance Training Performed Yes    VAD Patient? No    PAD/SET Patient? No      Pain Assessment   Currently in Pain? No/denies                Social History   Tobacco Use  Smoking Status Former   Packs/day: 0.50   Years: 10.00   Pack years: 5.00   Types: Cigarettes   Quit date: 04/14/1998   Years since quitting: 23.0  Smokeless Tobacco Never    Goals Met:  Independence with exercise equipment Exercise tolerated well No report of concerns or symptoms today Strength training completed today  Goals Unmet:  Not Applicable  Comments: Pt able to follow exercise prescription today without complaint.  Will continue to monitor for progression.    Dr. Emily Filbert is Medical Director for [place].  Dr. Ottie Glazier is Medical Director for [facility] Pulmonary Rehabilitation.

## 2021-05-02 NOTE — Patient Instructions (Signed)
Discharge Patient Instructions  Patient Details  Name: [name] MRN: [id number] Date of Birth: 1945/12/10 Referring Provider:  Armando Reichert, MD   Number of Visits: 36  Reason for Discharge:  Patient reached a stable level of exercise. Patient independent in their exercise. Patient has met program and personal goals.  Smoking History:  Social History   Tobacco Use  Smoking Status Former   Packs/day: 0.50   Years: 10.00   Pack years: 5.00   Types: Cigarettes   Quit date: 04/14/1998   Years since quitting: 23.0  Smokeless Tobacco Never    Diagnosis:  Status post coronary artery stent placement  Initial Exercise Prescription:  Initial Exercise Prescription - 02/28/21 1100       Date of Initial Exercise RX and Referring Provider   Date 02/28/21    Referring Provider Sena Slate MD      Oxygen   Maintain Oxygen Saturation 88% or higher      Treadmill   MPH 2    Grade 1    Minutes 15    METs 2.81      NuStep   Level 1    SPM 80    Minutes 15    METs 2      T5 Nustep   Level 1    SPM 80    Minutes 15    METs 2      Track   Laps 31    Minutes 15    METs 2.69      Prescription Details   Frequency (times per week) 3    Duration Progress to 30 minutes of continuous aerobic without signs/symptoms of physical distress      Intensity   THRR 40-80% of Max Heartrate 112-135    Ratings of Perceived Exertion 11-13    Perceived Dyspnea 0-4      Progression   Progression Continue to progress workloads to maintain intensity without signs/symptoms of physical distress.      Resistance Training   Training Prescription Yes    Weight 3 lb    Reps 10-15             Discharge Exercise Prescription (Final Exercise Prescription Changes):  Exercise Prescription Changes - 04/22/21 1200       Response to Exercise   Blood Pressure (Admit) 124/70    Blood Pressure (Exit) 122/60    Heart Rate (Admit) 62 bpm    Heart Rate (Exercise) 118 bpm     Heart Rate (Exit) 101 bpm    Rating of Perceived Exertion (Exercise) 12    Symptoms none    Duration Continue with 30 min of aerobic exercise without signs/symptoms of physical distress.    Intensity THRR unchanged      Progression   Progression Continue to progress workloads to maintain intensity without signs/symptoms of physical distress.    Average METs 3.79      Resistance Training   Training Prescription Yes    Weight 3 lb    Reps 10-15      Interval Training   Interval Training No      Treadmill   MPH 3    Grade 2    Minutes 15    METs 4.12      NuStep   Level 4    Minutes 15    METs 3.5      Elliptical   Level 1    Minutes 15      T5 Nustep  Level 3    Minutes 15      Track   Laps 40    Minutes 15    METs 3.18      Home Exercise Plan   Plans to continue exercise at Home (comment)   walking, treadmill   Frequency Add 2 additional days to program exercise sessions.    Initial Home Exercises Provided 03/15/21      Oxygen   Maintain Oxygen Saturation 88% or higher             Functional Capacity:  6 Minute Walk     Row Name 02/28/21 1145 04/30/21 0941       6 Minute Walk   Phase Initial Discharge    Distance 1188 feet 1640 feet    Distance % Change -- 38 %    Distance Feet Change -- 452 ft    Walk Time 6 minutes 6 minutes    # of Rest Breaks 0 0    MPH 2.25 3.1    METS 2.57 3.7    RPE 8 12    Perceived Dyspnea  -- 0    VO2 Peak 8.98 12.9    Symptoms No --    Resting HR 58 bpm 78 bpm    Resting BP 136/70 130/68    Resting Oxygen Saturation  99 % 97 %    Exercise Oxygen Saturation  during 6 min walk 99 % 96 %    Max Ex. HR 89 bpm 116 bpm    Max Ex. BP 138/60 142/64    2 Minute Post BP 132/64 --             Nutrition & Weight - Outcomes:  Pre Biometrics - 02/28/21 1149       Pre Biometrics   Height 5' 4.4" (1.636 m)    Weight 147 lb 3.2 oz (66.8 kg)    BMI (Calculated) 24.95    Single Leg Stand 30 seconds              Post Biometrics - 04/30/21 0942        Post  Biometrics   Height 5' 4.4" (1.636 m)    Weight 144 lb 4.8 oz (65.5 kg)    BMI (Calculated) 24.46    Single Leg Stand 30 seconds             Nutrition:  Nutrition Therapy & Goals - 04/02/21 0838       Nutrition Therapy   Diet Heart healthy, low Na, T2DM    Drug/Food Interactions Statins/Certain Fruits    Protein (specify units) 50g    Fiber 25 grams    Whole Grain Foods 3 servings    Saturated Fats 12 max. grams    Fruits and Vegetables 8 servings/day    Sodium 1.5 grams      Personal Nutrition Goals   Nutrition Goal ST: try using vinegar and/or citrus to add flavor while reducing salt (<1/4 tsp when adding to meals), taste food before salting LT: A1c </= 7, saturated fat <12g/day, Na <1.5g/day, follow MyPlate structure with most meals.    Comments 76 y.o. F admitted to rehab s/p stent placement presenting with CAD, T2DM, hypothyroidism, HLD, anemia, osteoporosis. Relevant medications include vitamin C, lipitor, calcium/vit D supplement, B12. iron, magnesium, MVI, omega-3, protonix,  glipizide, metformin, synthroid. PYP 69. B: greek yogurt or cottage cheese with fruit and granola S: protein drink before rehab (30g) L: tuna with onions and pickles on crackers or tomato  soup or brunswick stew or pimento cheese sandwich or salad when out to eat or baked fish D: same as lunch. She eats lost of different beans w/ vegetable oil. She does not usually salt while cooking. She feels that salt is her weakness, she estimates using 1.5 tsp salt per day in added salt. Drinks: she reports not drinking much (she does not like water) - splenda sweetened iced tea or flavored water. She got mrs. dash, but she does not feel it tastes good. Discussed heart healthy eating and diabetes friendly eating.      Intervention Plan   Intervention Prescribe, educate and counsel regarding individualized specific dietary modifications aiming towards targeted core  components such as weight, hypertension, lipid management, diabetes, heart failure and other comorbidities.    Expected Outcomes Short Term Goal: Understand basic principles of dietary content, such as calories, fat, sodium, cholesterol and nutrients.;Short Term Goal: A plan has been developed with personal nutrition goals set during dietitian appointment.;Long Term Goal: Adherence to prescribed nutrition plan.              Goals reviewed with patient; copy given to patient.

## 2021-05-03 ENCOUNTER — Encounter: Payer: Medicare Other | Admitting: *Deleted

## 2021-05-03 ENCOUNTER — Other Ambulatory Visit: Payer: Self-pay

## 2021-05-03 DIAGNOSIS — Z955 Presence of coronary angioplasty implant and graft: Secondary | ICD-10-CM

## 2021-05-03 NOTE — Progress Notes (Signed)
Daily Session Note  Patient Details  Name: NOAMI BOVE MRN: [id number] Date of Birth: 08-Jan-1946 Referring Provider:   Flowsheet Row Cardiac Rehab from 02/28/2021 in [facility] Cardiac and Pulmonary Rehab  Referring Provider Donnelly Angelica MD       Encounter Date: 05/03/2021  Check In:  Session Check In - 05/03/21 0924       Check-In   Supervising physician immediately available to respond to emergencies See telemetry face sheet for immediately available ER MD    Location [facility]Cardiac [facility] Pulmonary Rehab    Staff Present Renita Papa, RN BSN;Joseph [place], RCP,RRT,BSRT;Jessica [place], [place], RCEP, CCRP, CCET    Virtual Visit No    Medication changes reported     No    Fall or balance concerns reported    No    Warm-up and Cool-down Performed on first and last piece of equipment    Resistance Training Performed Yes    VAD Patient? No    PAD/SET Patient? No      Pain Assessment   Currently in Pain? No/denies                Social History   Tobacco Use  Smoking Status Former   Packs/day: 0.50   Years: 10.00   Pack years: 5.00   Types: Cigarettes   Quit date: 04/14/1998   Years since quitting: 23.0  Smokeless Tobacco Never    Goals Met:  Independence with exercise equipment Exercise tolerated well No report of concerns or symptoms today Strength training completed today  Goals Unmet:  Not Applicable  Comments: Pt able to follow exercise prescription today without complaint.  Will continue to monitor for progression.    Dr. Emily Filbert is Medical Director for [place].  Dr. Ottie Glazier is Medical Director for [facility] Pulmonary Rehabilitation.

## 2021-05-07 ENCOUNTER — Other Ambulatory Visit: Payer: Self-pay

## 2021-05-07 DIAGNOSIS — Z955 Presence of coronary angioplasty implant and graft: Secondary | ICD-10-CM | POA: Diagnosis not present

## 2021-05-07 NOTE — Progress Notes (Signed)
Daily Session Note  Patient Details  Name: [name] MRN: [id number] Date of Birth: [birth year] Referring Provider:   Flowsheet Row Cardiac Rehab from 02/28/2021 in [facility] Cardiac and Pulmonary Rehab  Referring Provider Donnelly Angelica MD       Encounter Date: 05/07/2021  Check In:  Session Check In - 05/07/21 0932       Check-In   Supervising physician immediately available to respond to emergencies See telemetry face sheet for immediately available ER MD    Location [facility]-Cardiac & Pulmonary Rehab    Staff Present Birdie Sons, MPA, RN;Melissa [place], RDN, LDN;Jessica Hawkins, MA, RCEP, CCRP, CCET    Virtual Visit No    Medication changes reported     No    Fall or balance concerns reported    No    Tobacco Cessation No Change    Warm-up and Cool-down Performed on first and last piece of equipment    Resistance Training Performed Yes    VAD Patient? No    PAD/SET Patient? No      Pain Assessment   Currently in Pain? No/denies                Social History   Tobacco Use  Smoking Status Former   Packs/day: 0.50   Years: 10.00   Pack years: 5.00   Types: Cigarettes   Quit date: 04/14/1998   Years since quitting: 23.0  Smokeless Tobacco Never    Goals Met:  Independence with exercise equipment Exercise tolerated well No report of concerns or symptoms today Strength training completed today  Goals Unmet:  Not Applicable  Comments: Pt able to follow exercise prescription today without complaint.  Will continue to monitor for progression.    Dr. Emily Filbert is Medical Director for [place].  Dr. Ottie Glazier is Medical Director for [facility] Pulmonary Rehabilitation.

## 2021-05-08 ENCOUNTER — Encounter: Payer: Self-pay | Admitting: *Deleted

## 2021-05-08 DIAGNOSIS — Z955 Presence of coronary angioplasty implant and graft: Secondary | ICD-10-CM

## 2021-05-08 NOTE — Progress Notes (Signed)
Cardiac Individual Treatment Plan  Patient Details  Name: KHAMILLE BEYNON MRN: [id number] Date of Birth: 1945-12-26 Referring Provider:   Flowsheet Row Cardiac Rehab from 02/28/2021 in [facility] Cardiac and Pulmonary Rehab  Referring Provider Donnelly Angelica MD       Initial Encounter Date:  Flowsheet Row Cardiac Rehab from 02/28/2021 in [facility] Cardiac and Pulmonary Rehab  Date 02/28/21       Visit Diagnosis: Status post coronary artery stent placement  Patient's Home Medications on Admission:  Current Outpatient Medications:    acetaminophen (TYLENOL) 500 MG tablet, Take 500 mg by mouth 2 (two) times daily as needed for mild pain., Disp: , Rfl:    aspirin EC 81 MG tablet, Take 81 mg by mouth daily with supper., Disp: , Rfl:    atorvastatin (LIPITOR) 80 MG tablet, Take 1 tablet (80 mg total) by mouth daily., Disp: 30 tablet, Rfl: 11   Calcium Carbonate-Vitamin D (CALCIUM 600+D PO), Take 1 tablet by mouth 2 (two) times daily with a meal., Disp: , Rfl:    Cholecalciferol (VITAMIN D) 50 MCG (2000 UT) CAPS, Take 2,000 Units by mouth daily with lunch., Disp: , Rfl:    clopidogrel (PLAVIX) 75 MG tablet, Take 1 tablet (75 mg total) by mouth daily with breakfast., Disp: 30 tablet, Rfl: 11   clopidogrel (PLAVIX) 75 MG tablet, Take by mouth. (Patient not taking: Reported on 02/14/2021), Disp: , Rfl:    clotrimazole-betamethasone (LOTRISONE) cream, Apply 1 application topically daily as needed for rash., Disp: , Rfl:    fenofibrate (TRICOR) 48 MG tablet, Take 48 mg by mouth daily with lunch., Disp: , Rfl:    ferrous sulfate 325 (65 FE) MG tablet, Take 325 mg by mouth daily with supper. (Patient not taking: Reported on 02/14/2021), Disp: , Rfl:    ferrous sulfate 325 (65 FE) MG tablet, Take 1 tablet by mouth daily., Disp: , Rfl:    fluticasone (FLONASE) 50 MCG/ACT nasal spray, Place 2 sprays into both nostrils daily., Disp: , Rfl:    glipiZIDE (GLUCOTROL) 10 MG tablet, Take 10 mg by mouth 2 (two) times  daily before a meal., Disp: , Rfl:    isosorbide mononitrate (IMDUR) 30 MG 24 hr tablet, Take 30 mg by mouth daily. (Patient not taking: Reported on 02/14/2021), Disp: , Rfl:    levothyroxine (SYNTHROID, LEVOTHROID) 125 MCG tablet, Take 125 mcg by mouth daily before breakfast., Disp: , Rfl:    levothyroxine (SYNTHROID, LEVOTHROID) 25 MCG tablet, Take 25 mcg by mouth every Saturday. For a total of 128mg on Saturdays., Disp: , Rfl:    magnesium oxide (MAG-OX) 400 MG tablet, Take 400 mg by mouth daily with breakfast., Disp: , Rfl:    metFORMIN (GLUCOPHAGE) 500 MG tablet, Take 1,000 mg by mouth 2 (two) times daily with a meal., Disp: , Rfl:    metoprolol succinate (TOPROL-XL) 25 MG 24 hr tablet, Take 25 mg by mouth daily., Disp: , Rfl:    Multiple Vitamin (MULTIVITAMIN WITH MINERALS) TABS tablet, Take 1 tablet by mouth daily with supper., Disp: , Rfl:    Multiple Vitamins-Minerals (HAIR SKIN AND NAILS FORMULA) TABS, Take 1 tablet by mouth daily with supper., Disp: , Rfl:    Multiple Vitamins-Minerals (ICAPS PO), Take 1 tablet by mouth daily with breakfast., Disp: , Rfl:    Omega-3 Fatty Acids (FISH OIL) 1000 MG CAPS, Take 1,000 mg by mouth daily with breakfast., Disp: , Rfl:    pantoprazole (PROTONIX) 40 MG tablet, Take 40 mg by mouth  daily., Disp: , Rfl:    Tetrahydrozoline HCl (VISINE OP), Place 1 drop into both eyes daily as needed (eye irritation)., Disp: , Rfl:    vitamin B-12 (CYANOCOBALAMIN) 500 MCG tablet, Take 500 mcg by mouth daily with breakfast., Disp: , Rfl:    vitamin C (ASCORBIC ACID) 500 MG tablet, Take 500 mg by mouth daily with lunch., Disp: , Rfl:   Past Medical History: Past Medical History:  Diagnosis Date   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      Exercise Target Goals: Exercise Program Goal: Individual exercise prescription set using results from initial 6 min walk test and THRR while considering  patients activity barriers and safety.   Exercise Prescription Goal: Initial exercise prescription builds to 30-45 minutes a day of aerobic activity, 2-3 days per week.  Home exercise guidelines will be given to patient during program as part of exercise prescription that the participant will acknowledge.   Education: Aerobic Exercise: - Group verbal and visual presentation on the components of exercise prescription. Introduces F.I.T.T principle from ACSM for exercise prescriptions.  Reviews F.I.T.T. principles of aerobic exercise including progression. Written material given at graduation. Flowsheet Row Cardiac Rehab from 05/02/2021 in [facility] Cardiac and Pulmonary Rehab  Education need identified 02/28/21  Date 04/11/21  Educator [facility]  Instruction Review Code 1- Verbalizes Understanding       Education: Resistance Exercise: - Group verbal and visual presentation on the components of exercise prescription. Introduces F.I.T.T principle from ACSM for exercise prescriptions  Reviews F.I.T.T. principles of resistance exercise including progression. Written material given at graduation. Flowsheet Row Cardiac Rehab from 05/02/2021 in [facility] Cardiac and Pulmonary Rehab  Date 04/18/21  Educator [facility]  Instruction Review Code 1- Verbalizes Understanding        Education: Exercise & Equipment Safety: - Individual verbal instruction and demonstration of equipment use and safety with use of the equipment. Flowsheet Row Cardiac Rehab from 05/02/2021 in [facility] Cardiac and Pulmonary Rehab   Date 02/14/21  Educator [employer]  Instruction Review Code 1- Verbalizes Understanding       Education: Exercise Physiology & General Exercise Guidelines: - Group verbal and written instruction with models to review the exercise physiology of the cardiovascular system and associated critical values. Provides general exercise guidelines with specific guidelines to those with heart or lung disease.  Flowsheet Row Cardiac Rehab from 05/02/2021 in [facility] Cardiac and Pulmonary Rehab  Education need identified 02/28/21  Date 04/04/21  Educator [facility]  Instruction Review Code 1- [employer] Understanding       Education: Flexibility, Balance, Mind/Body Relaxation: - Group verbal and visual presentation with interactive activity on the components of exercise prescription. Introduces F.I.T.T principle from ACSM for exercise prescriptions. Reviews F.I.T.T. principles of flexibility and balance exercise training including progression. Also discusses the mind body connection.  Reviews various relaxation techniques to help reduce and manage stress (i.e. Deep breathing, progressive muscle relaxation,  and visualization). Balance handout provided to take home. Written material given at graduation. Flowsheet Row Cardiac Rehab from 05/02/2021 in [facility] Cardiac and Pulmonary Rehab  Date 04/25/21  Educator [employer]  Instruction Review Code 1- Verbalizes Understanding       Activity Barriers & Risk Stratification:  Activity Barriers & Cardiac Risk Stratification - 02/28/21 1146       Activity Barriers & Cardiac Risk Stratification   Activity Barriers Deconditioning;Muscular Weakness   legs feel weaker   Cardiac Risk Stratification Moderate             6 Minute Walk:  6 Minute Walk     Row Name 02/28/21 1145 04/30/21 0941       6 Minute Walk   Phase Initial Discharge    Distance 1188 feet 1640 feet    Distance % Change -- 38 %    Distance Feet Change -- 452 ft    Walk Time 6 minutes 6 minutes    # of Rest  Breaks 0 0    MPH 2.25 3.1    METS 2.57 3.7    RPE 8 12    Perceived Dyspnea  -- 0    VO2 Peak 8.98 12.9    Symptoms No --    Resting HR 58 bpm 78 bpm    Resting BP 136/70 130/68    Resting Oxygen Saturation  99 % 97 %    Exercise Oxygen Saturation  during 6 min walk 99 % 96 %    Max Ex. HR 89 bpm 116 bpm    Max Ex. BP 138/60 142/64    2 Minute Post BP 132/64 --             Oxygen Initial Assessment:   Oxygen Re-Evaluation:   Oxygen Discharge (Final Oxygen Re-Evaluation):   Initial Exercise Prescription:  Initial Exercise Prescription - 02/28/21 1100       Date of Initial Exercise RX and Referring Provider   Date 02/28/21    Referring Provider Donnelly Angelica MD      Oxygen   Maintain Oxygen Saturation 88% or higher      Treadmill   MPH 2    Grade 1    Minutes 15    METs 2.81      NuStep   Level 1    SPM 80    Minutes 15    METs 2      T5 Nustep   Level 1    SPM 80    Minutes 15    METs 2      Track   Laps 31    Minutes 15    METs 2.69      Prescription Details   Frequency (times per week) 3    Duration Progress to 30 minutes of continuous aerobic without signs/symptoms of physical distress      Intensity   THRR 40-80% of Max Heartrate 112-135    Ratings of Perceived Exertion 11-13    Perceived Dyspnea 0-4      Progression   Progression Continue to progress workloads to maintain intensity without signs/symptoms of physical distress.      Resistance Training   Training Prescription Yes    Weight 3 lb    Reps 10-15             Perform Capillary Blood Glucose checks as needed.  Exercise Prescription Changes:   Exercise Prescription Changes     Row Name 02/28/21 1100 03/15/21 0900 03/25/21 0900  04/09/21 1300 04/22/21 1200     Response to Exercise   Blood Pressure (Admit) 136/70 -- 102/60 110/60 124/70   Blood Pressure (Exercise) 138/60 -- 156/70 -- --   Blood Pressure (Exit) 132/64 -- 122/64 110/62 122/60   Heart Rate (Admit)  58 bpm -- 69 bpm 91 bpm 62 bpm   Heart Rate (Exercise) 89 bpm -- 131 bpm 135 bpm 118 bpm   Heart Rate (Exit) 70 bpm -- 87 bpm 93 bpm 101 bpm   Oxygen Saturation (Admit) 99 % -- -- -- --   Oxygen Saturation (Exercise) 99 % -- -- -- --   Rating of Perceived Exertion (Exercise) 8 -- _0 Symptoms none -- none none none   Comments walk test results -- -- -- --   Duration -- -- Continue with 30 min of aerobic exercise without signs/symptoms of physical distress. Continue with 30 min of aerobic exercise without signs/symptoms of physical distress. Continue with 30 min of aerobic exercise without signs/symptoms of physical distress.   Intensity -- -- THRR unchanged THRR unchanged THRR unchanged     Progression   Progression -- -- Continue to progress workloads to maintain intensity without signs/symptoms of physical distress. Continue to progress workloads to maintain intensity without signs/symptoms of physical distress. Continue to progress workloads to maintain intensity without signs/symptoms of physical distress.   Average METs -- -- 2.74 3 3.79     Resistance Training   Training Prescription -- -- Yes Yes Yes   Weight -- -- 3 lb 3 lb 3 lb   Reps -- -- 10-15 10-15 10-15     Interval Training   Interval Training -- -- No No No     Treadmill   MPH -- -- 2.5 -- 3   Grade -- -- 1 -- 2   Minutes -- -- 15 -- 15   METs -- -- 3.26 -- 4.12     Recumbant Bike   Level -- -- 1 -- --   Minutes -- -- 15 -- --   METs -- -- 2.4 -- --     NuStep   Level -- -- _1 Minutes -- -- _2 METs -- -- 2.8 3.3 3.5     Elliptical   Level -- -- -- -- 1   Minutes -- -- -- -- 15     T5 Nustep   Level -- -- 1 -- 3   Minutes -- -- 15 -- 15   METs -- -- 2.5 -- --     Track   Laps -- -- 35 31 40   Minutes -- -- _3 METs -- -- 2.9 2.7 3.18     Home Exercise Plan   Plans to continue exercise at -- Home (comment)  walking, treadmill Home (comment)  walking, treadmill Home  (comment)  walking, treadmill Home (comment)  walking, treadmill   Frequency -- Add 2 additional days to program exercise sessions. Add 2 additional days to program exercise sessions. Add 2 additional days to program exercise sessions. Add 2 additional days to program exercise sessions.   Initial Home Exercises Provided -- 03/15/21 03/15/21 03/15/21 03/15/21     Oxygen   Maintain Oxygen Saturation -- 88% or higher 88% or higher -- 88% or higher    Row Name 05/06/21 1300             Response to Exercise   Blood Pressure (Admit) 122/64  Blood Pressure (Exit) 122/64       Heart Rate (Admit) 65 bpm       Heart Rate (Exercise) 124 bpm       Heart Rate (Exit) 98 bpm       Oxygen Saturation (Admit) 98 %       Oxygen Saturation (Exercise) 97 %       Oxygen Saturation (Exit) 96 %       Rating of Perceived Exertion (Exercise) 12       Symptoms none       Duration Continue with 30 min of aerobic exercise without signs/symptoms of physical distress.       Intensity THRR unchanged         Progression   Progression Continue to progress workloads to maintain intensity without signs/symptoms of physical distress.       Average METs 4.01         Resistance Training   Training Prescription Yes       Weight 3 lb       Reps 10-15         Interval Training   Interval Training No         Treadmill   MPH 3.4       Grade 2       Minutes 15       METs 4.54         Recumbant Bike   Level 2       Watts 29       Minutes 15       METs 3.5         T5 Nustep   Level 5       Minutes 15       METs 2.5         Home Exercise Plan   Plans to continue exercise at Home (comment)  walking, treadmill       Frequency Add 2 additional days to program exercise sessions.       Initial Home Exercises Provided 03/15/21         Oxygen   Maintain Oxygen Saturation 88% or higher                Exercise Comments:   Exercise Comments     Row Name 03/05/21 6767           Exercise  Comments First full day of exercise!  Patient was oriented to gym and equipment including functions, settings, policies, and procedures.  Patient's individual exercise prescription and treatment plan were reviewed.  All starting workloads were established based on the results of the 6 minute walk test done at initial orientation visit.  The plan for exercise progression was also introduced and progression will be customized based on patient's performance and goals.                Exercise Goals and Review:   Exercise Goals     Row Name 02/28/21 1149             Exercise Goals   Increase Physical Activity Yes       Intervention Provide advice, education, support and counseling about physical activity/exercise needs.;Develop an individualized exercise prescription for aerobic and resistive training based on initial evaluation findings, risk stratification, comorbidities and participant's personal goals.       Expected Outcomes Short Term: Attend rehab on a regular basis to increase amount of physical activity.;Long Term: Add in home exercise to make exercise  part of routine and to increase amount of physical activity.;Long Term: Exercising regularly at least 3-5 days a week.       Increase Strength and Stamina Yes       Intervention Provide advice, education, support and counseling about physical activity/exercise needs.;Develop an individualized exercise prescription for aerobic and resistive training based on initial evaluation findings, risk stratification, comorbidities and participant's personal goals.       Expected Outcomes Short Term: Increase workloads from initial exercise prescription for resistance, speed, and METs.;Short Term: Perform resistance training exercises routinely during rehab and add in resistance training at home;Long Term: Improve cardiorespiratory fitness, muscular endurance and strength as measured by increased METs and functional capacity (6MWT)       Able to  understand and use rate of perceived exertion (RPE) scale Yes       Intervention Provide education and explanation on how to use RPE scale       Expected Outcomes Short Term: Able to use RPE daily in rehab to express subjective intensity level;Long Term:  Able to use RPE to guide intensity level when exercising independently       Able to understand and use Dyspnea scale Yes       Intervention Provide education and explanation on how to use Dyspnea scale       Expected Outcomes Short Term: Able to use Dyspnea scale daily in rehab to express subjective sense of shortness of breath during exertion;Long Term: Able to use Dyspnea scale to guide intensity level when exercising independently       Knowledge and understanding of Target Heart Rate Range (THRR) Yes       Intervention Provide education and explanation of THRR including how the numbers were predicted and where they are located for reference       Expected Outcomes Short Term: Able to state/look up THRR;Short Term: Able to use daily as guideline for intensity in rehab;Long Term: Able to use THRR to govern intensity when exercising independently       Able to check pulse independently Yes       Intervention Provide education and demonstration on how to check pulse in carotid and radial arteries.;Review the importance of being able to check your own pulse for safety during independent exercise       Expected Outcomes Short Term: Able to explain why pulse checking is important during independent exercise;Long Term: Able to check pulse independently and accurately       Understanding of Exercise Prescription Yes       Intervention Provide education, explanation, and written materials on patient's individual exercise prescription       Expected Outcomes Short Term: Able to explain program exercise prescription;Long Term: Able to explain home exercise prescription to exercise independently                Exercise Goals Re-Evaluation :  Exercise  Goals Re-Evaluation     Row Name 03/05/21 0928 03/12/21 1332 03/15/21 0952 03/22/21 0937 03/25/21 0903     Exercise Goal Re-Evaluation   Exercise Goals Review Increase Physical Activity;Able to understand and use rate of perceived exertion (RPE) scale;Knowledge and understanding of Target Heart Rate Range (THRR);Understanding of Exercise Prescription;Increase Strength and Stamina;Able to understand and use Dyspnea scale;Able to check pulse independently Increase Physical Activity;Increase Strength and Stamina Increase Physical Activity;Increase Strength and Stamina;Able to understand and use rate of perceived exertion (RPE) scale;Able to understand and use Dyspnea scale;Knowledge and understanding of Target Heart Rate Range (THRR);Able  to check pulse independently;Understanding of Exercise Prescription Increase Physical Activity;Increase Strength and Stamina;Understanding of Exercise Prescription Increase Physical Activity;Increase Strength and Stamina;Understanding of Exercise Prescription   Comments Reviewed RPE and dyspnea scales, THR and program prescription with pt today.  Pt voiced understanding and was given a copy of goals to take home. Amandeep is doing well and is learning how to use all the machines.  She is able to do prescribed workloads.  She has reached [facility] some sessions.  We will monitor progress. Reviewed home exercise with pt today.  Pt plans to walk and use treadmill at home for exercise.  Reviewed THR, pulse, RPE, sign and symptoms, pulse oximetery and when to call 911 or MD.  Also discussed weather considerations and indoor options.  Pt voiced understanding. Zenobia is doing well in rehab. She doing well on her treadmill at home on her off days.  She will usually walk for about 30 min each time.  She is staring to feel better since her stent. Jacolyn is doing well in rehab.  She is now using the treadmill some at 2.5 mph.  She is also up to level 4 on the NuStep.  We will continue to montior  her progress.   Expected Outcomes Short: Use RPE daily to regulate intensity. Long: Follow program prescription in THR. Short:  attend consistently Long: improve overall stamina Short: Start to add in exercise at home Long: Continue to improve stamina. Short: Continue to use treadmill on off days Long; Continue to improve stamina. Short: Increase T5 NuStep Long: Continue to improve stamina on treadmill    Row Name 04/09/21 1302 04/16/21 0932 04/22/21 1301 05/06/21 1309       Exercise Goal Re-Evaluation   Exercise Goals Review Increase Physical Activity;Increase Strength and Stamina Increase Physical Activity;Increase Strength and Stamina Increase Physical Activity;Increase Strength and Stamina Increase Physical Activity;Increase Strength and Stamina;Understanding of Exercise Prescription    Comments Evangelyne attends consistently and works in her THR range.  She is slowly progressing workloads on TM and seated machines.  We will continue to monitor progress. Kalissa will use the treadmill at home - 35 minutes (warm up and cool down like in rehab) - will do this days when not at rehab. She is also an active person, especially when the weather is nice. Torin is doing great in rehab. She is up to 40 laps on the track and even tried the elliptical for 5 minutes! She also increased her treadmill loads to 3 speed/ 2% incline overall. Will continue to monitor. Coraline is getting ready to graduate.  She improved her post walk by 48%!!  She is set to continue to exercise by walking at home and is thinking of joining the [place] after graduation.  She has really enjoyed attending the program and has appreciated the boost it has given her.    Expected Outcomes Short: maintain consistent attendance Long: improve average MET level ST: continue exercising at home LT: improve average MET level Short: work up tolerance on the elliptical Long: Continue to build up overall strength and stamina Graduate! Continue to exercise  independently             Discharge Exercise Prescription (Final Exercise Prescription Changes):  Exercise Prescription Changes - 05/06/21 1300       Response to Exercise   Blood Pressure (Admit) 122/64    Blood Pressure (Exit) 122/64    Heart Rate (Admit) 65 bpm    Heart Rate (Exercise) 124 bpm  Heart Rate (Exit) 98 bpm    Oxygen Saturation (Admit) 98 %    Oxygen Saturation (Exercise) 97 %    Oxygen Saturation (Exit) 96 %    Rating of Perceived Exertion (Exercise) 12    Symptoms none    Duration Continue with 30 min of aerobic exercise without signs/symptoms of physical distress.    Intensity THRR unchanged      Progression   Progression Continue to progress workloads to maintain intensity without signs/symptoms of physical distress.    Average METs 4.01      Resistance Training   Training Prescription Yes    Weight 3 lb    Reps 10-15      Interval Training   Interval Training No      Treadmill   MPH 3.4    Grade 2    Minutes 15    METs 4.54      Recumbant Bike   Level 2    Watts 29    Minutes 15    METs 3.5      T5 Nustep   Level 5    Minutes 15    METs 2.5      Home Exercise Plan   Plans to continue exercise at Home (comment)   walking, treadmill   Frequency Add 2 additional days to program exercise sessions.    Initial Home Exercises Provided 03/15/21      Oxygen   Maintain Oxygen Saturation 88% or higher             Nutrition:  Target Goals: Understanding of nutrition guidelines, daily intake of sodium '1500mg'$ , cholesterol '200mg'$ , calories 30% from fat and 7% or less from saturated fats, daily to have 5 or more servings of fruits and vegetables.  Education: All About Nutrition: -Group instruction provided by verbal, written material, interactive activities, discussions, models, and posters to present general guidelines for heart healthy nutrition including fat, fiber, MyPlate, the role of sodium in heart healthy nutrition, utilization of  the nutrition label, and utilization of this knowledge for meal planning. Follow up email sent as well. Written material given at graduation. Flowsheet Row Cardiac Rehab from 05/02/2021 in [facility] Cardiac and Pulmonary Rehab  Education need identified 02/28/21       Biometrics:  Pre Biometrics - 02/28/21 1149       Pre Biometrics   Height 5' 4.4" (1.636 m)    Weight 147 lb 3.2 oz (66.8 kg)    BMI (Calculated) 24.95    Single Leg Stand 30 seconds             Post Biometrics - 04/30/21 0942        Post  Biometrics   Height 5' 4.4" (1.636 m)    Weight 144 lb 4.8 oz (65.5 kg)    BMI (Calculated) 24.46    Single Leg Stand 30 seconds             Nutrition Therapy Plan and Nutrition Goals:  Nutrition Therapy & Goals - 04/02/21 0838       Nutrition Therapy   Diet Heart healthy, low Na, T2DM    Drug/Food Interactions Statins/Certain Fruits    Protein (specify units) 50g    Fiber 25 grams    Whole Grain Foods 3 servings    Saturated Fats 12 max. grams    Fruits and Vegetables 8 servings/day    Sodium 1.5 grams      Personal Nutrition Goals   Nutrition Goal ST: try using vinegar  and/or citrus to add flavor while reducing salt (<1/4 tsp when adding to meals), taste food before salting LT: A1c </= 7, saturated fat <12g/day, Na <1.5g/day, follow MyPlate structure with most meals.    Comments 76 y.o. F admitted to rehab s/p stent placement presenting with CAD, T2DM, hypothyroidism, HLD, anemia, osteoporosis. Relevant medications include vitamin C, lipitor, calcium/vit D supplement, B12. iron, magnesium, MVI, omega-3, protonix,  glipizide, metformin, synthroid. PYP 69. B: greek yogurt or cottage cheese with fruit and granola S: protein drink before rehab (30g) L: tuna with onions and pickles on crackers or tomato soup or brunswick stew or pimento cheese sandwich or salad when out to eat or baked fish D: same as lunch. She eats lost of different beans w/ vegetable oil. She does not  usually salt while cooking. She feels that salt is her weakness, she estimates using 1.5 tsp salt per day in added salt. Drinks: she reports not drinking much (she does not like water) - splenda sweetened iced tea or flavored water. She got mrs. dash, but she does not feel it tastes good. Discussed heart healthy eating and diabetes friendly eating.      Intervention Plan   Intervention Prescribe, educate and counsel regarding individualized specific dietary modifications aiming towards targeted core components such as weight, hypertension, lipid management, diabetes, heart failure and other comorbidities.    Expected Outcomes Short Term Goal: Understand basic principles of dietary content, such as calories, fat, sodium, cholesterol and nutrients.;Short Term Goal: A plan has been developed with personal nutrition goals set during dietitian appointment.;Long Term Goal: Adherence to prescribed nutrition plan.             Nutrition Assessments:  MEDIFICTS Score Key: ?70 Need to make dietary changes  40-70 Heart Healthy Diet ? 40 Therapeutic Level Cholesterol Diet  Flowsheet Row Cardiac Rehab from 04/30/2021 in [facility] Cardiac and Pulmonary Rehab  Picture Your Plate Total Score on Admission 69  Picture Your Plate Total Score on Discharge 60      Picture Your Plate Scores: <26 Unhealthy dietary pattern with much room for improvement. 41-50 Dietary pattern unlikely to meet recommendations for good health and room for improvement. 51-60 More healthful dietary pattern, with some room for improvement.  >60 Healthy dietary pattern, although there may be some specific behaviors that could be improved.    Nutrition Goals Re-Evaluation:  Nutrition Goals Re-Evaluation     [place] Name 03/19/21 0929 04/16/21 0925           Goals   Nutrition Goal -- ST: try using vinegar and/or citrus to add flavor while reducing salt (<1/4 tsp when adding to meals), taste food before salting LT: A1c </= 7, saturated  fat <12g/day, Na <1.5g/day, follow MyPlate structure with most meals.      Comment No goals have been established yet as she has not met with the RD. Appt on 12/13. She reports she uses vinegar and citrus with greens and fish and enjoys that, she does not care for Mrs. Dash. She still will use salt if the recipe calls for it, but now just a pinch. She has stopped salting her food all together - she says generally it has been bland, but doable. Continue to monitor salt intake and continue to enhnace the flavor of food with blends. Discussed adding flavor by adding flavorful dips/sauces like pesto and tzatiki - she is not interested. She would like to continue with this goal.      Expected Outcome Short: Meet  with RD Long: Establish heart healthy diet ST: try using vinegar and/or citrus to add flavor while reducing salt (<1/4 tsp when adding to meals), taste food before salting LT: A1c </= 7, saturated fat <12g/day, Na <1.5g/day, follow MyPlate structure with most meals.               Nutrition Goals Discharge (Final Nutrition Goals Re-Evaluation):  Nutrition Goals Re-Evaluation - 04/16/21 0925       Goals   Nutrition Goal ST: try using vinegar and/or citrus to add flavor while reducing salt (<1/4 tsp when adding to meals), taste food before salting LT: A1c </= 7, saturated fat <12g/day, Na <1.5g/day, follow MyPlate structure with most meals.    Comment She reports she uses vinegar and citrus with greens and fish and enjoys that, she does not care for Mrs. Dash. She still will use salt if the recipe calls for it, but now just a pinch. She has stopped salting her food all together - she says generally it has been bland, but doable. Continue to monitor salt intake and continue to enhnace the flavor of food with blends. Discussed adding flavor by adding flavorful dips/sauces like pesto and tzatiki - she is not interested. She would like to continue with this goal.    Expected Outcome ST: try using  vinegar and/or citrus to add flavor while reducing salt (<1/4 tsp when adding to meals), taste food before salting LT: A1c </= 7, saturated fat <12g/day, Na <1.5g/day, follow MyPlate structure with most meals.             Psychosocial: Target Goals: Acknowledge presence or absence of significant depression and/or stress, maximize coping skills, provide positive support system. Participant is able to verbalize types and ability to use techniques and skills needed for reducing stress and depression.   Education: Stress, Anxiety, and Depression - Group verbal and visual presentation to define topics covered.  Reviews how body is impacted by stress, anxiety, and depression.  Also discusses healthy ways to reduce stress and to treat/manage anxiety and depression.  Written material given at graduation. Flowsheet Row Cardiac Rehab from 05/02/2021 in [facility] Cardiac and Pulmonary Rehab  Date 03/28/21  Educator [facility]  Instruction Review Code 1- Verbalizes Understanding       Education: Sleep Hygiene -Provides group verbal and written instruction about how sleep can affect your health.  Define sleep hygiene, discuss sleep cycles and impact of sleep habits. Review good sleep hygiene tips.    Initial Review & Psychosocial Screening:  Initial Psych Review & Screening - 02/14/21 0948       Initial Review   Current issues with None Identified      Family Dynamics   Good Support System? Yes    Comments Marye can look to her son, husband and life group fro support. She has a positive outlook on her health.      Barriers   Psychosocial barriers to participate in program The patient should benefit from training in stress management and relaxation.;There are no identifiable barriers or psychosocial needs.      Screening Interventions   Interventions Encouraged to exercise;To provide support and resources with identified psychosocial needs;Provide feedback about the scores to participant    Expected  Outcomes Short Term goal: Utilizing psychosocial counselor, staff and physician to assist with identification of specific Stressors or current issues interfering with healing process. Setting desired goal for each stressor or current issue identified.;Long Term Goal: Stressors or current issues are controlled or eliminated.;Short  Term goal: Identification and review with participant of any Quality of Life or Depression concerns found by scoring the questionnaire.;Long Term goal: The participant improves quality of Life and PHQ9 Scores as seen by post scores and/or verbalization of changes             Quality of Life Scores:   Quality of Life - 04/30/21 0926       Quality of Life   Select Quality of Life      Quality of Life Scores   Health/Function Pre 28.6 %    Health/Function Post 29.33 %    Health/Function % Change 2.55 %    Socioeconomic Pre 26.43 %    Socioeconomic Post 29.06 %    Socioeconomic % Change  9.95 %    Psych/Spiritual Pre 29.64 %    Psych/Spiritual Post 30 %    Psych/Spiritual % Change 1.21 %    Family Pre 27.6 %    Family Post 26.4 %    Family % Change -4.35 %    GLOBAL Pre 28.22 %    GLOBAL Post 28.99 %    GLOBAL % Change 2.73 %            Scores of 19 and below usually indicate a poorer quality of life in these areas.  A difference of  2-3 points is a clinically meaningful difference.  A difference of 2-3 points in the total score of the Quality of Life Index has been associated with significant improvement in overall quality of life, self-image, physical symptoms, and general health in studies assessing change in quality of life.  PHQ-9: Recent Review Flowsheet Data     Depression screen [facility] 2/9 04/30/2021 02/28/2021   Decreased Interest 0 0   Down, Depressed, Hopeless 0 0   PHQ - 2 Score 0 0   Altered sleeping 0 0   Tired, decreased energy 0 0   Change in appetite 0 0   Feeling bad or failure about yourself  0 0   Trouble concentrating 0 0    Moving slowly or fidgety/restless 0 0   Suicidal thoughts 0 0   PHQ-9 Score 0 0   Difficult doing work/chores Not difficult at all Not difficult at all      Interpretation of Total Score  Total Score Depression Severity:  1-4 = Minimal depression, 5-9 = Mild depression, 10-14 = Moderate depression, 15-19 = Moderately severe depression, 20-27 = Severe depression   Psychosocial Evaluation and Intervention:  Psychosocial Evaluation - 02/14/21 0950       Psychosocial Evaluation & Interventions   Interventions Encouraged to exercise with the program and follow exercise prescription;Relaxation education;Stress management education    Comments Dariona can look to her son, husband and life group fro support. She has a positive outlook on her health.    Expected Outcomes Short: Start [facility] to help with mood. Long: Maintain a healthy mental state    Continue Psychosocial Services  Follow up required by staff             Psychosocial Re-Evaluation:  Psychosocial Re-Evaluation     Row Name 03/19/21 0936 04/16/21 0934           Psychosocial Re-Evaluation   Current issues with None Identified None Identified      Comments Raynah is doing well mentally. She denies any big stressor or "drama". No medications for depression or anxiety. Her biggest support is her husband, pastor's wife, and a couple local friends. She has  2 Sons and 2 grandchildren that she sees periodically. She is enjoying the program thus far. Melondy reports not having any stress, her husband is recovering well from his stroke. She continues to use her support sytem and does crosswords/sodoku/reading. She is having no issues with sleep - she has sleepy time tea because she likes it and it relaxes her.      Expected Outcomes Short: Continue routine attendance Long: Utilize exercise for stress management and maintain positive attitude Short: Continue routine attendance Long: Utilize exercise for stress management and  maintain positive attitude      Interventions Encouraged to attend Cardiac Rehabilitation for the exercise Encouraged to attend Cardiac Rehabilitation for the exercise      Continue Psychosocial Services  Follow up required by staff Follow up required by staff               Psychosocial Discharge (Final Psychosocial Re-Evaluation):  Psychosocial Re-Evaluation - 04/16/21 0934       Psychosocial Re-Evaluation   Current issues with None Identified    Comments Arrie reports not having any stress, her husband is recovering well from his stroke. She continues to use her support sytem and does crosswords/sodoku/reading. She is having no issues with sleep - she has sleepy time tea because she likes it and it relaxes her.    Expected Outcomes Short: Continue routine attendance Long: Utilize exercise for stress management and maintain positive attitude    Interventions Encouraged to attend Cardiac Rehabilitation for the exercise    Continue Psychosocial Services  Follow up required by staff             Vocational Rehabilitation: Provide vocational rehab assistance to qualifying candidates.   Vocational Rehab Evaluation & Intervention:   Education: Education Goals: Education classes will be provided on a variety of topics geared toward better understanding of heart health and risk factor modification. Participant will state understanding/return demonstration of topics presented as noted by education test scores.  Learning Barriers/Preferences:  Learning Barriers/Preferences - 02/14/21 0947       Learning Barriers/Preferences   Learning Barriers None    Learning Preferences None             General Cardiac Education Topics:  AED/CPR: - Group verbal and written instruction with the use of models to demonstrate the basic use of the AED with the basic ABC's of resuscitation.   Anatomy and Cardiac Procedures: - Group verbal and visual presentation and models provide  information about basic cardiac anatomy and function. Reviews the testing methods done to diagnose heart disease and the outcomes of the test results. Describes the treatment choices: Medical Management, Angioplasty, or Coronary Bypass Surgery for treating various heart conditions including Myocardial Infarction, Angina, Valve Disease, and Cardiac Arrhythmias.  Written material given at graduation. Flowsheet Row Cardiac Rehab from 05/02/2021 in [facility] Cardiac and Pulmonary Rehab  Date 04/18/21  Educator SB  Instruction Review Code 1- Verbalizes Understanding       Medication Safety: - Group verbal and visual instruction to review commonly prescribed medications for heart and lung disease. Reviews the medication, class of the drug, and side effects. Includes the steps to properly store meds and maintain the prescription regimen.  Written material given at graduation. Flowsheet Row Cardiac Rehab from 05/02/2021 in [facility] Cardiac and Pulmonary Rehab  Date 03/05/21  Educator SB  Instruction Review Code 1- Verbalizes Understanding       Intimacy: - Group verbal instruction through game format to discuss how heart and  lung disease can affect sexual intimacy. Written material given at graduation.. Flowsheet Row Cardiac Rehab from 05/02/2021 in [facility] Cardiac and Pulmonary Rehab  Date 04/11/21  Educator [facility]  Instruction Review Code 1- Verbalizes Understanding       Know Your Numbers and Heart Failure: - Group verbal and visual instruction to discuss disease risk factors for cardiac and pulmonary disease and treatment options.  Reviews associated critical values for Overweight/Obesity, Hypertension, Cholesterol, and Diabetes.  Discusses basics of heart failure: signs/symptoms and treatments.  Introduces Heart Failure Zone chart for action plan for heart failure.  Written material given at graduation. Flowsheet Row Cardiac Rehab from 05/02/2021 in [facility] Cardiac and Pulmonary Rehab  Education need  identified 02/28/21  Date 03/14/21  Educator SB  Instruction Review Code 1- Verbalizes Understanding       Infection Prevention: - Provides verbal and written material to individual with discussion of infection control including proper hand washing and proper equipment cleaning during exercise session. Flowsheet Row Cardiac Rehab from 05/02/2021 in [facility] Cardiac and Pulmonary Rehab  Date 02/14/21  Educator [facility]  Instruction Review Code 1- Verbalizes Understanding       Falls Prevention: - Provides verbal and written material to individual with discussion of falls prevention and safety. Flowsheet Row Cardiac Rehab from 05/02/2021 in [facility] Cardiac and Pulmonary Rehab  Date 02/14/21  Educator [facility]  Instruction Review Code 1- Verbalizes Understanding       Other: -Provides group and verbal instruction on various topics (see comments)   Knowledge Questionnaire Score:  Knowledge Questionnaire Score - 04/30/21 0925       Knowledge Questionnaire Score   Pre Score 22/26    Post Score 25/26             Core Components/Risk Factors/Patient Goals at Admission:  Personal Goals and Risk Factors at Admission - 02/28/21 1151       Core Components/Risk Factors/Patient Goals on Admission    Weight Management Yes;Weight Loss    Intervention Weight Management: Develop a combined nutrition and exercise program designed to reach desired caloric intake, while maintaining appropriate intake of nutrient and fiber, sodium and fats, and appropriate energy expenditure required for the weight goal.;Weight Management/Obesity: Establish reasonable short term and long term weight goals.;Weight Management: Provide education and appropriate resources to help participant work on and attain dietary goals.    Admit Weight 147 lb 3.2 oz (66.8 kg)    Goal Weight: Short Term 142 lb (64.4 kg)    Goal Weight: Long Term 135 lb (61.2 kg)    Expected Outcomes Short Term: Continue to assess and modify  interventions until short term weight is achieved;Long Term: Adherence to nutrition and physical activity/exercise program aimed toward attainment of established weight goal;Weight Loss: Understanding of general recommendations for a balanced deficit meal plan, which promotes 1-2 lb weight loss per week and includes a negative energy balance of [phone] kcal/d;Understanding recommendations for meals to include 15-35% energy as protein, 25-35% energy from fat, 35-60% energy from carbohydrates, less than $RemoveB'200mg'UrjMnrOj$  of dietary cholesterol, 20-35 gm of total fiber daily;Understanding of distribution of calorie intake throughout the day with the consumption of 4-5 meals/snacks    Diabetes Yes    Intervention Provide education about signs/symptoms and action to take for hypo/hyperglycemia.;Provide education about proper nutrition, including hydration, and aerobic/resistive exercise prescription along with prescribed medications to achieve blood glucose in normal ranges: Fasting glucose 65-99 mg/dL    Expected Outcomes Short Term: Participant verbalizes understanding of the signs/symptoms and immediate  care of hyper/hypoglycemia, proper foot care and importance of medication, aerobic/resistive exercise and nutrition plan for blood glucose control.;Long Term: Attainment of HbA1C < 7%.    Hypertension Yes    Intervention Provide education on lifestyle modifcations including regular physical activity/exercise, weight management, moderate sodium restriction and increased consumption of fresh fruit, vegetables, and low fat dairy, alcohol moderation, and smoking cessation.;Monitor prescription use compliance.    Expected Outcomes Short Term: Continued assessment and intervention until BP is < 140/77mm HG in hypertensive participants. < 130/27mm HG in hypertensive participants with diabetes, heart failure or chronic kidney disease.;Long Term: Maintenance of blood pressure at goal levels.    Lipids Yes    Intervention Provide  education and support for participant on nutrition & aerobic/resistive exercise along with prescribed medications to achieve LDL '70mg'$ , HDL >$Remo'40mg'Frobt$ .    Expected Outcomes Short Term: Participant states understanding of desired cholesterol values and is compliant with medications prescribed. Participant is following exercise prescription and nutrition guidelines.;Long Term: Cholesterol controlled with medications as prescribed, with individualized exercise RX and with personalized nutrition plan. Value goals: LDL < $Rem'70mg'uNml$ , HDL > 40 mg.             Education:Diabetes - Individual verbal and written instruction to review signs/symptoms of diabetes, desired ranges of glucose level fasting, after meals and with exercise. Acknowledge that pre and post exercise glucose checks will be done for 3 sessions at entry of program. [place] from 05/02/2021 in [facility] Cardiac and Pulmonary Rehab  Date 02/14/21  Educator [facility]  Instruction Review Code 1- Verbalizes Understanding       Core Components/Risk Factors/Patient Goals Review:   Goals and Risk Factor Review     Row Name 03/19/21 0930 04/16/21 0935           Core Components/Risk Factors/Patient Goals Review   Personal Goals Review Diabetes;Weight Management/Obesity;Hypertension Diabetes;Weight Management/Obesity;Hypertension      Review Ajia is doing well and staying compliant with all her medications. She takes her blood sugars twice/day- denies any abnormal low or high sugars. She is aware of her symptoms if anything falls into the abnormal range. She would like to lose weight and her goal is to reach 125 lb and looking for diet changes to help achieve that. She weighs herself everyday. She has a BP cuff at home but does not check her BPs- we talked about checking them periodically to ensure they are staying in stable ranges. Zareya checks her BG 2x/day 530 am/pm - this morning 87 (usually 80-130); she has not had any abnormal BG  recently. She does not check her BP, but has a cuff - encrouaged to check it when not at rehab. She continues to weigh herself daily - she has been weight stable.      Expected Outcomes Short: Check BP at home Long: Continue to manage lifestyle risk factors Short: Check BP at home Long: Continue to manage lifestyle risk factors               Core Components/Risk Factors/Patient Goals at Discharge (Final Review):   Goals and Risk Factor Review - 04/16/21 0935       Core Components/Risk Factors/Patient Goals Review   Personal Goals Review Diabetes;Weight Management/Obesity;Hypertension    Review Jennifermarie checks her BG 2x/day 530 am/pm - this morning 87 (usually 80-130); she has not had any abnormal BG recently. She does not check her BP, but has a cuff - encrouaged to check it when not at rehab. She continues  to weigh herself daily - she has been weight stable.    Expected Outcomes Short: Check BP at home Long: Continue to manage lifestyle risk factors             ITP Comments:  ITP Comments     Row Name 02/14/21 0947 02/28/21 1144 03/05/21 0927 03/13/21 0740 04/02/21 0922   ITP Comments Virtual Visit completed. Patient informed on EP and RD appointment and 6 Minute walk test. Patient also informed of patient health questionnaires on My Chart. Patient Verbalizes understanding. Visit diagnosis can be found in [facility] 01/30/2021. Completed 6MWT and gym orientation. Initial ITP created and sent for review to Dr. Emily Filbert, Medical Director. First full day of exercise!  Patient was oriented to gym and equipment including functions, settings, policies, and procedures.  Patient's individual exercise prescription and treatment plan were reviewed.  All starting workloads were established based on the results of the 6 minute walk test done at initial orientation visit.  The plan for exercise progression was also introduced and progression will be customized based on patient's performance and goals. 30  Day review completed. Medical Director ITP review done, changes made as directed, and signed approval by Medical Director.    New to program Completed intial RD Consultation    Row Name 04/10/21 [phone] 05/08/21 0915         ITP Comments 30 Day review completed. Medical Director ITP review done, changes made as directed, and signed approval by Medical Director. 30 Day review completed. Medical Director ITP review done, changes made as directed, and signed approval by Medical Director.               Comments:

## 2021-05-09 ENCOUNTER — Other Ambulatory Visit: Payer: Self-pay

## 2021-05-09 DIAGNOSIS — Z955 Presence of coronary angioplasty implant and graft: Secondary | ICD-10-CM

## 2021-05-09 NOTE — Progress Notes (Signed)
Discharge Progress Report  Patient Details  Name: [name] MRN: [id number] Date of Birth: April 12, 1946 Referring Provider:   Flowsheet Row Cardiac Rehab from 02/28/2021 in [facility] Cardiac and Pulmonary Rehab  Referring Provider Sena Slate MD        Number of Visits: 36  Reason for Discharge:  Patient reached a stable level of exercise. Patient independent in their exercise. Patient has met program and personal goals.  Smoking History:  Social History   Tobacco Use  Smoking Status Former   Packs/day: 0.50   Years: 10.00   Pack years: 5.00   Types: Cigarettes   Quit date: 04/14/1998   Years since quitting: 23.0  Smokeless Tobacco Never    Diagnosis:  Status post coronary artery stent placement  ADL UCSD:   Initial Exercise Prescription:  Initial Exercise Prescription - 02/28/21 1100       Date of Initial Exercise RX and Referring Provider   Date 02/28/21    Referring Provider Sena Slate MD      Oxygen   Maintain Oxygen Saturation 88% or higher      Treadmill   MPH 2    Grade 1    Minutes 15    METs 2.81      NuStep   Level 1    SPM 80    Minutes 15    METs 2      T5 Nustep   Level 1    SPM 80    Minutes 15    METs 2      Track   Laps 31    Minutes 15    METs 2.69      Prescription Details   Frequency (times per week) 3    Duration Progress to 30 minutes of continuous aerobic without signs/symptoms of physical distress      Intensity   THRR 40-80% of Max Heartrate 112-135    Ratings of Perceived Exertion 11-13    Perceived Dyspnea 0-4      Progression   Progression Continue to progress workloads to maintain intensity without signs/symptoms of physical distress.      Resistance Training   Training Prescription Yes    Weight 3 lb    Reps 10-15             Discharge Exercise Prescription (Final Exercise Prescription Changes):  Exercise Prescription Changes - 05/06/21 1300       Response to Exercise   Blood Pressure  (Admit) 122/64    Blood Pressure (Exit) 122/64    Heart Rate (Admit) 65 bpm    Heart Rate (Exercise) 124 bpm    Heart Rate (Exit) 98 bpm    Oxygen Saturation (Admit) 98 %    Oxygen Saturation (Exercise) 97 %    Oxygen Saturation (Exit) 96 %    Rating of Perceived Exertion (Exercise) 12    Symptoms none    Duration Continue with 30 min of aerobic exercise without signs/symptoms of physical distress.    Intensity THRR unchanged      Progression   Progression Continue to progress workloads to maintain intensity without signs/symptoms of physical distress.    Average METs 4.01      Resistance Training   Training Prescription Yes    Weight 3 lb    Reps 10-15      Interval Training   Interval Training No      Treadmill   MPH 3.4    Grade 2    Minutes 15  METs 4.54      Recumbant Bike   Level 2    Watts 29    Minutes 15    METs 3.5      T5 Nustep   Level 5    Minutes 15    METs 2.5      Home Exercise Plan   Plans to continue exercise at Home (comment)   walking, treadmill   Frequency Add 2 additional days to program exercise sessions.    Initial Home Exercises Provided 03/15/21      Oxygen   Maintain Oxygen Saturation 88% or higher             Functional Capacity:  6 Minute Walk     Row Name 02/28/21 1145 04/30/21 0941       6 Minute Walk   Phase Initial Discharge    Distance 1188 feet 1640 feet    Distance % Change -- 38 %    Distance Feet Change -- 452 ft    Walk Time 6 minutes 6 minutes    # of Rest Breaks 0 0    MPH 2.25 3.1    METS 2.57 3.7    RPE 8 12    Perceived Dyspnea  -- 0    VO2 Peak 8.98 12.9    Symptoms No --    Resting HR 58 bpm 78 bpm    Resting BP 136/70 130/68    Resting Oxygen Saturation  99 % 97 %    Exercise Oxygen Saturation  during 6 min walk 99 % 96 %    Max Ex. HR 89 bpm 116 bpm    Max Ex. BP 138/60 142/64    2 Minute Post BP 132/64 --             Psychological, QOL, Others - Outcomes: PHQ 2/9: Depression  screen [facility] 2/9 04/30/2021 02/28/2021  Decreased Interest 0 0  Down, Depressed, Hopeless 0 0  PHQ - 2 Score 0 0  Altered sleeping 0 0  Tired, decreased energy 0 0  Change in appetite 0 0  Feeling bad or failure about yourself  0 0  Trouble concentrating 0 0  Moving slowly or fidgety/restless 0 0  Suicidal thoughts 0 0  PHQ-9 Score 0 0  Difficult doing work/chores Not difficult at all Not difficult at all    Quality of Life:  Quality of Life - 04/30/21 0926       Quality of Life   Select Quality of Life      Quality of Life Scores   Health/Function Pre 28.6 %    Health/Function Post 29.33 %    Health/Function % Change 2.55 %    Socioeconomic Pre 26.43 %    Socioeconomic Post 29.06 %    Socioeconomic % Change  9.95 %    Psych/Spiritual Pre 29.64 %    Psych/Spiritual Post 30 %    Psych/Spiritual % Change 1.21 %    Family Pre 27.6 %    Family Post 26.4 %    Family % Change -4.35 %    GLOBAL Pre 28.22 %    GLOBAL Post 28.99 %    GLOBAL % Change 2.73 %              Nutrition & Weight - Outcomes:  Pre Biometrics - 02/28/21 1149       Pre Biometrics   Height 5' 4.4" (1.636 m)    Weight 147 lb 3.2 oz (66.8 kg)  BMI (Calculated) 24.95    Single Leg Stand 30 seconds             Post Biometrics - 04/30/21 0942        Post  Biometrics   Height 5' 4.4" (1.636 m)    Weight 144 lb 4.8 oz (65.5 kg)    BMI (Calculated) 24.46    Single Leg Stand 30 seconds             Nutrition:  Nutrition Therapy & Goals - 04/02/21 0838       Nutrition Therapy   Diet Heart healthy, low Na, T2DM    Drug/Food Interactions Statins/Certain Fruits    Protein (specify units) 50g    Fiber 25 grams    Whole Grain Foods 3 servings    Saturated Fats 12 max. grams    Fruits and Vegetables 8 servings/day    Sodium 1.5 grams      Personal Nutrition Goals   Nutrition Goal ST: try using vinegar and/or citrus to add flavor while reducing salt (<1/4 tsp when adding to meals),  taste food before salting LT: A1c </= 7, saturated fat <12g/day, Na <1.5g/day, follow MyPlate structure with most meals.    Comments 76 y.o. F admitted to rehab s/p stent placement presenting with CAD, T2DM, hypothyroidism, HLD, anemia, osteoporosis. Relevant medications include vitamin C, lipitor, calcium/vit D supplement, B12. iron, magnesium, MVI, omega-3, protonix,  glipizide, metformin, synthroid. PYP 69. B: greek yogurt or cottage cheese with fruit and granola S: protein drink before rehab (30g) L: tuna with onions and pickles on crackers or tomato soup or brunswick stew or pimento cheese sandwich or salad when out to eat or baked fish D: same as lunch. She eats lost of different beans w/ vegetable oil. She does not usually salt while cooking. She feels that salt is her weakness, she estimates using 1.5 tsp salt per day in added salt. Drinks: she reports not drinking much (she does not like water) - splenda sweetened iced tea or flavored water. She got mrs. dash, but she does not feel it tastes good. Discussed heart healthy eating and diabetes friendly eating.      Intervention Plan   Intervention Prescribe, educate and counsel regarding individualized specific dietary modifications aiming towards targeted core components such as weight, hypertension, lipid management, diabetes, heart failure and other comorbidities.    Expected Outcomes Short Term Goal: Understand basic principles of dietary content, such as calories, fat, sodium, cholesterol and nutrients.;Short Term Goal: A plan has been developed with personal nutrition goals set during dietitian appointment.;Long Term Goal: Adherence to prescribed nutrition plan.             Nutrition Discharge:   Education Questionnaire Score:  Knowledge Questionnaire Score - 04/30/21 0925       Knowledge Questionnaire Score   Pre Score 22/26    Post Score 25/26             Goals reviewed with patient; copy given to patient.

## 2021-05-09 NOTE — Progress Notes (Signed)
Daily Session Note  Patient Details  Name: [name] MRN: [id number] Date of Birth: 30-Jun-1945 Referring Provider:   Flowsheet Row Cardiac Rehab from 02/28/2021 in [facility] Cardiac and Pulmonary Rehab  Referring Provider Donnelly Angelica MD       Encounter Date: 05/09/2021  Check In:  Session Check In - 05/09/21 0917       Check-In   Supervising physician immediately available to respond to emergencies See telemetry face sheet for immediately available ER MD    Location [facility]-Cardiac & Pulmonary Rehab    Staff Present Birdie Sons, MPA, RN;Joseph [place], Jaci Carrel, BS, ACSM CEP, Exercise Physiologist;Amanda Oletta Darter, [place], ACSM CEP, Exercise Physiologist    Virtual Visit No    Medication changes reported     No    Fall or balance concerns reported    No    Tobacco Cessation No Change    Warm-up and Cool-down Performed on first and last piece of equipment    Resistance Training Performed Yes    VAD Patient? No    PAD/SET Patient? No      Pain Assessment   Currently in Pain? No/denies                Social History   Tobacco Use  Smoking Status Former   Packs/day: 0.50   Years: 10.00   Pack years: 5.00   Types: Cigarettes   Quit date: 04/14/1998   Years since quitting: 23.0  Smokeless Tobacco Never    Goals Met:  Independence with exercise equipment Exercise tolerated well No report of concerns or symptoms today Strength training completed today  Goals Unmet:  Not Applicable  Comments:  [name] graduated today from  rehab with 36 sessions completed.  Details of the patient's exercise prescription and what She needs to do in order to continue the prescription and progress were discussed with patient.  Patient was given a copy of prescription and goals.  Patient verbalized understanding.  [name] plans to continue to exercise by walking on the treadmill at home.     Dr. Emily Filbert is Medical Director for [facility].  Dr.  Ottie Glazier is Medical Director for [facility] Pulmonary Rehabilitation.

## 2021-05-09 NOTE — Progress Notes (Signed)
Cardiac Individual Treatment Plan  Patient Details  Name: KHAMILLE BEYNON MRN: [id number] Date of Birth: 1945-12-26 Referring Provider:   Flowsheet Row Cardiac Rehab from 02/28/2021 in [facility] Cardiac and Pulmonary Rehab  Referring Provider Donnelly Angelica MD       Initial Encounter Date:  Flowsheet Row Cardiac Rehab from 02/28/2021 in [facility] Cardiac and Pulmonary Rehab  Date 02/28/21       Visit Diagnosis: Status post coronary artery stent placement  Patient's Home Medications on Admission:  Current Outpatient Medications:    acetaminophen (TYLENOL) 500 MG tablet, Take 500 mg by mouth 2 (two) times daily as needed for mild pain., Disp: , Rfl:    aspirin EC 81 MG tablet, Take 81 mg by mouth daily with supper., Disp: , Rfl:    atorvastatin (LIPITOR) 80 MG tablet, Take 1 tablet (80 mg total) by mouth daily., Disp: 30 tablet, Rfl: 11   Calcium Carbonate-Vitamin D (CALCIUM 600+D PO), Take 1 tablet by mouth 2 (two) times daily with a meal., Disp: , Rfl:    Cholecalciferol (VITAMIN D) 50 MCG (2000 UT) CAPS, Take 2,000 Units by mouth daily with lunch., Disp: , Rfl:    clopidogrel (PLAVIX) 75 MG tablet, Take 1 tablet (75 mg total) by mouth daily with breakfast., Disp: 30 tablet, Rfl: 11   clopidogrel (PLAVIX) 75 MG tablet, Take by mouth. (Patient not taking: Reported on 02/14/2021), Disp: , Rfl:    clotrimazole-betamethasone (LOTRISONE) cream, Apply 1 application topically daily as needed for rash., Disp: , Rfl:    fenofibrate (TRICOR) 48 MG tablet, Take 48 mg by mouth daily with lunch., Disp: , Rfl:    ferrous sulfate 325 (65 FE) MG tablet, Take 325 mg by mouth daily with supper. (Patient not taking: Reported on 02/14/2021), Disp: , Rfl:    ferrous sulfate 325 (65 FE) MG tablet, Take 1 tablet by mouth daily., Disp: , Rfl:    fluticasone (FLONASE) 50 MCG/ACT nasal spray, Place 2 sprays into both nostrils daily., Disp: , Rfl:    glipiZIDE (GLUCOTROL) 10 MG tablet, Take 10 mg by mouth 2 (two) times  daily before a meal., Disp: , Rfl:    isosorbide mononitrate (IMDUR) 30 MG 24 hr tablet, Take 30 mg by mouth daily. (Patient not taking: Reported on 02/14/2021), Disp: , Rfl:    levothyroxine (SYNTHROID, LEVOTHROID) 125 MCG tablet, Take 125 mcg by mouth daily before breakfast., Disp: , Rfl:    levothyroxine (SYNTHROID, LEVOTHROID) 25 MCG tablet, Take 25 mcg by mouth every Saturday. For a total of 128mg on Saturdays., Disp: , Rfl:    magnesium oxide (MAG-OX) 400 MG tablet, Take 400 mg by mouth daily with breakfast., Disp: , Rfl:    metFORMIN (GLUCOPHAGE) 500 MG tablet, Take 1,000 mg by mouth 2 (two) times daily with a meal., Disp: , Rfl:    metoprolol succinate (TOPROL-XL) 25 MG 24 hr tablet, Take 25 mg by mouth daily., Disp: , Rfl:    Multiple Vitamin (MULTIVITAMIN WITH MINERALS) TABS tablet, Take 1 tablet by mouth daily with supper., Disp: , Rfl:    Multiple Vitamins-Minerals (HAIR SKIN AND NAILS FORMULA) TABS, Take 1 tablet by mouth daily with supper., Disp: , Rfl:    Multiple Vitamins-Minerals (ICAPS PO), Take 1 tablet by mouth daily with breakfast., Disp: , Rfl:    Omega-3 Fatty Acids (FISH OIL) 1000 MG CAPS, Take 1,000 mg by mouth daily with breakfast., Disp: , Rfl:    pantoprazole (PROTONIX) 40 MG tablet, Take 40 mg by mouth  daily., Disp: , Rfl:    Tetrahydrozoline HCl (VISINE OP), Place 1 drop into both eyes daily as needed (eye irritation)., Disp: , Rfl:    vitamin B-12 (CYANOCOBALAMIN) 500 MCG tablet, Take 500 mcg by mouth daily with breakfast., Disp: , Rfl:    vitamin C (ASCORBIC ACID) 500 MG tablet, Take 500 mg by mouth daily with lunch., Disp: , Rfl:   Past Medical History: Past Medical History:  Diagnosis Date   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      Exercise Target Goals: Exercise Program Goal: Individual exercise prescription set using results from initial 6 min walk test and THRR while considering  patients activity barriers and safety.   Exercise Prescription Goal: Initial exercise prescription builds to 30-45 minutes a day of aerobic activity, 2-3 days per week.  Home exercise guidelines will be given to patient during program as part of exercise prescription that the participant will acknowledge.   Education: Aerobic Exercise: - Group verbal and visual presentation on the components of exercise prescription. Introduces F.I.T.T principle from ACSM for exercise prescriptions.  Reviews F.I.T.T. principles of aerobic exercise including progression. Written material given at graduation. Flowsheet Row Cardiac Rehab from 05/09/2021 in [facility] Cardiac and Pulmonary Rehab  Education need identified 02/28/21  Date 04/11/21  Educator [facility]  Instruction Review Code 1- Verbalizes Understanding       Education: Resistance Exercise: - Group verbal and visual presentation on the components of exercise prescription. Introduces F.I.T.T principle from ACSM for exercise prescriptions  Reviews F.I.T.T. principles of resistance exercise including progression. Written material given at graduation. Flowsheet Row Cardiac Rehab from 05/09/2021 in [facility] Cardiac and Pulmonary Rehab  Date 04/18/21  Educator [facility]  Instruction Review Code 1- Verbalizes Understanding        Education: Exercise & Equipment Safety: - Individual verbal instruction and demonstration of equipment use and safety with use of the equipment. Flowsheet Row Cardiac Rehab from 05/09/2021 in [facility] Cardiac and Pulmonary Rehab   Date 02/14/21  Educator [facility]  Instruction Review Code 1- Verbalizes Understanding       Education: Exercise Physiology & General Exercise Guidelines: - Group verbal and written instruction with models to review the exercise physiology of the cardiovascular system and associated critical values. Provides general exercise guidelines with specific guidelines to those with heart or lung disease.  Flowsheet Row Cardiac Rehab from 05/09/2021 in [facility] Cardiac and Pulmonary Rehab  Education need identified 02/28/21  Date 04/04/21  Educator [facility]  Instruction Review Code 1- [employer] Understanding       Education: Flexibility, Balance, Mind/Body Relaxation: - Group verbal and visual presentation with interactive activity on the components of exercise prescription. Introduces F.I.T.T principle from ACSM for exercise prescriptions. Reviews F.I.T.T. principles of flexibility and balance exercise training including progression. Also discusses the mind body connection.  Reviews various relaxation techniques to help reduce and manage stress (i.e. Deep breathing, progressive muscle relaxation,  and visualization). Balance handout provided to take home. Written material given at graduation. Flowsheet Row Cardiac Rehab from 05/09/2021 in [facility] Cardiac and Pulmonary Rehab  Date 04/25/21  Educator [facility]  Instruction Review Code 1- Verbalizes Understanding       Activity Barriers & Risk Stratification:  Activity Barriers & Cardiac Risk Stratification - 02/28/21 1146       Activity Barriers & Cardiac Risk Stratification   Activity Barriers Deconditioning;Muscular Weakness   legs feel weaker   Cardiac Risk Stratification Moderate             6 Minute Walk:  6 Minute Walk     Row Name 02/28/21 1145 04/30/21 0941       6 Minute Walk   Phase Initial Discharge    Distance 1188 feet 1640 feet    Distance % Change -- 38 %    Distance Feet Change -- 452 ft    Walk Time 6 minutes 6 minutes    # of Rest  Breaks 0 0    MPH 2.25 3.1    METS 2.57 3.7    RPE 8 12    Perceived Dyspnea  -- 0    VO2 Peak 8.98 12.9    Symptoms No --    Resting HR 58 bpm 78 bpm    Resting BP 136/70 130/68    Resting Oxygen Saturation  99 % 97 %    Exercise Oxygen Saturation  during 6 min walk 99 % 96 %    Max Ex. HR 89 bpm 116 bpm    Max Ex. BP 138/60 142/64    2 Minute Post BP 132/64 --             Oxygen Initial Assessment:   Oxygen Re-Evaluation:   Oxygen Discharge (Final Oxygen Re-Evaluation):   Initial Exercise Prescription:  Initial Exercise Prescription - 02/28/21 1100       Date of Initial Exercise RX and Referring Provider   Date 02/28/21    Referring Provider Donnelly Angelica MD      Oxygen   Maintain Oxygen Saturation 88% or higher      Treadmill   MPH 2    Grade 1    Minutes 15    METs 2.81      NuStep   Level 1    SPM 80    Minutes 15    METs 2      T5 Nustep   Level 1    SPM 80    Minutes 15    METs 2      Track   Laps 31    Minutes 15    METs 2.69      Prescription Details   Frequency (times per week) 3    Duration Progress to 30 minutes of continuous aerobic without signs/symptoms of physical distress      Intensity   THRR 40-80% of Max Heartrate 112-135    Ratings of Perceived Exertion 11-13    Perceived Dyspnea 0-4      Progression   Progression Continue to progress workloads to maintain intensity without signs/symptoms of physical distress.      Resistance Training   Training Prescription Yes    Weight 3 lb    Reps 10-15             Perform Capillary Blood Glucose checks as needed.  Exercise Prescription Changes:   Exercise Prescription Changes     Row Name 02/28/21 1100 03/15/21 0900 03/25/21 0900  04/09/21 1300 04/22/21 1200     Response to Exercise   Blood Pressure (Admit) 136/70 -- 102/60 110/60 124/70   Blood Pressure (Exercise) 138/60 -- 156/70 -- --   Blood Pressure (Exit) 132/64 -- 122/64 110/62 122/60   Heart Rate (Admit)  58 bpm -- 69 bpm 91 bpm 62 bpm   Heart Rate (Exercise) 89 bpm -- 131 bpm 135 bpm 118 bpm   Heart Rate (Exit) 70 bpm -- 87 bpm 93 bpm 101 bpm   Oxygen Saturation (Admit) 99 % -- -- -- --   Oxygen Saturation (Exercise) 99 % -- -- -- --   Rating of Perceived Exertion (Exercise) 8 -- _0 Symptoms none -- none none none   Comments walk test results -- -- -- --   Duration -- -- Continue with 30 min of aerobic exercise without signs/symptoms of physical distress. Continue with 30 min of aerobic exercise without signs/symptoms of physical distress. Continue with 30 min of aerobic exercise without signs/symptoms of physical distress.   Intensity -- -- THRR unchanged THRR unchanged THRR unchanged     Progression   Progression -- -- Continue to progress workloads to maintain intensity without signs/symptoms of physical distress. Continue to progress workloads to maintain intensity without signs/symptoms of physical distress. Continue to progress workloads to maintain intensity without signs/symptoms of physical distress.   Average METs -- -- 2.74 3 3.79     Resistance Training   Training Prescription -- -- Yes Yes Yes   Weight -- -- 3 lb 3 lb 3 lb   Reps -- -- 10-15 10-15 10-15     Interval Training   Interval Training -- -- No No No     Treadmill   MPH -- -- 2.5 -- 3   Grade -- -- 1 -- 2   Minutes -- -- 15 -- 15   METs -- -- 3.26 -- 4.12     Recumbant Bike   Level -- -- 1 -- --   Minutes -- -- 15 -- --   METs -- -- 2.4 -- --     NuStep   Level -- -- _1 Minutes -- -- _2 METs -- -- 2.8 3.3 3.5     Elliptical   Level -- -- -- -- 1   Minutes -- -- -- -- 15     T5 Nustep   Level -- -- 1 -- 3   Minutes -- -- 15 -- 15   METs -- -- 2.5 -- --     Track   Laps -- -- 35 31 40   Minutes -- -- _3 METs -- -- 2.9 2.7 3.18     Home Exercise Plan   Plans to continue exercise at -- Home (comment)  walking, treadmill Home (comment)  walking, treadmill Home  (comment)  walking, treadmill Home (comment)  walking, treadmill   Frequency -- Add 2 additional days to program exercise sessions. Add 2 additional days to program exercise sessions. Add 2 additional days to program exercise sessions. Add 2 additional days to program exercise sessions.   Initial Home Exercises Provided -- 03/15/21 03/15/21 03/15/21 03/15/21     Oxygen   Maintain Oxygen Saturation -- 88% or higher 88% or higher -- 88% or higher    Row Name 05/06/21 1300             Response to Exercise   Blood Pressure (Admit) 122/64  Blood Pressure (Exit) 122/64       Heart Rate (Admit) 65 bpm       Heart Rate (Exercise) 124 bpm       Heart Rate (Exit) 98 bpm       Oxygen Saturation (Admit) 98 %       Oxygen Saturation (Exercise) 97 %       Oxygen Saturation (Exit) 96 %       Rating of Perceived Exertion (Exercise) 12       Symptoms none       Duration Continue with 30 min of aerobic exercise without signs/symptoms of physical distress.       Intensity THRR unchanged         Progression   Progression Continue to progress workloads to maintain intensity without signs/symptoms of physical distress.       Average METs 4.01         Resistance Training   Training Prescription Yes       Weight 3 lb       Reps 10-15         Interval Training   Interval Training No         Treadmill   MPH 3.4       Grade 2       Minutes 15       METs 4.54         Recumbant Bike   Level 2       Watts 29       Minutes 15       METs 3.5         T5 Nustep   Level 5       Minutes 15       METs 2.5         Home Exercise Plan   Plans to continue exercise at Home (comment)  walking, treadmill       Frequency Add 2 additional days to program exercise sessions.       Initial Home Exercises Provided 03/15/21         Oxygen   Maintain Oxygen Saturation 88% or higher                Exercise Comments:   Exercise Comments     Row Name 03/05/21 0045 05/09/21 0918          Exercise Comments First full day of exercise!  Patient was oriented to gym and equipment including functions, settings, policies, and procedures.  Patient's individual exercise prescription and treatment plan were reviewed.  All starting workloads were established based on the results of the 6 minute walk test done at initial orientation visit.  The plan for exercise progression was also introduced and progression will be customized based on patient's performance and goals. Jaelee graduated today from  rehab with 36 sessions completed.  Details of the patient's exercise prescription and what She needs to do in order to continue the prescription and progress were discussed with patient.  Patient was given a copy of prescription and goals.  Patient verbalized understanding.  Olar plans to continue to exercise by walking on the treadmill at home.               Exercise Goals and Review:   Exercise Goals     Row Name 02/28/21 1149             Exercise Goals   Increase Physical Activity Yes  Intervention Provide advice, education, support and counseling about physical activity/exercise needs.;Develop an individualized exercise prescription for aerobic and resistive training based on initial evaluation findings, risk stratification, comorbidities and participant's personal goals.       Expected Outcomes Short Term: Attend rehab on a regular basis to increase amount of physical activity.;Long Term: Add in home exercise to make exercise part of routine and to increase amount of physical activity.;Long Term: Exercising regularly at least 3-5 days a week.       Increase Strength and Stamina Yes       Intervention Provide advice, education, support and counseling about physical activity/exercise needs.;Develop an individualized exercise prescription for aerobic and resistive training based on initial evaluation findings, risk stratification, comorbidities and participant's personal goals.        Expected Outcomes Short Term: Increase workloads from initial exercise prescription for resistance, speed, and METs.;Short Term: Perform resistance training exercises routinely during rehab and add in resistance training at home;Long Term: Improve cardiorespiratory fitness, muscular endurance and strength as measured by increased METs and functional capacity (6MWT)       Able to understand and use rate of perceived exertion (RPE) scale Yes       Intervention Provide education and explanation on how to use RPE scale       Expected Outcomes Short Term: Able to use RPE daily in rehab to express subjective intensity level;Long Term:  Able to use RPE to guide intensity level when exercising independently       Able to understand and use Dyspnea scale Yes       Intervention Provide education and explanation on how to use Dyspnea scale       Expected Outcomes Short Term: Able to use Dyspnea scale daily in rehab to express subjective sense of shortness of breath during exertion;Long Term: Able to use Dyspnea scale to guide intensity level when exercising independently       Knowledge and understanding of Target Heart Rate Range (THRR) Yes       Intervention Provide education and explanation of THRR including how the numbers were predicted and where they are located for reference       Expected Outcomes Short Term: Able to state/look up THRR;Short Term: Able to use daily as guideline for intensity in rehab;Long Term: Able to use THRR to govern intensity when exercising independently       Able to check pulse independently Yes       Intervention Provide education and demonstration on how to check pulse in carotid and radial arteries.;Review the importance of being able to check your own pulse for safety during independent exercise       Expected Outcomes Short Term: Able to explain why pulse checking is important during independent exercise;Long Term: Able to check pulse independently and accurately        Understanding of Exercise Prescription Yes       Intervention Provide education, explanation, and written materials on patient's individual exercise prescription       Expected Outcomes Short Term: Able to explain program exercise prescription;Long Term: Able to explain home exercise prescription to exercise independently                Exercise Goals Re-Evaluation :  Exercise Goals Re-Evaluation     Row Name 03/05/21 0928 03/12/21 1332 03/15/21 0952 03/22/21 0937 03/25/21 0903     Exercise Goal Re-Evaluation   Exercise Goals Review Increase Physical Activity;Able to understand and use rate of perceived exertion (  RPE) scale;Knowledge and understanding of Target Heart Rate Range (THRR);Understanding of Exercise Prescription;Increase Strength and Stamina;Able to understand and use Dyspnea scale;Able to check pulse independently Increase Physical Activity;Increase Strength and Stamina Increase Physical Activity;Increase Strength and Stamina;Able to understand and use rate of perceived exertion (RPE) scale;Able to understand and use Dyspnea scale;Knowledge and understanding of Target Heart Rate Range (THRR);Able to check pulse independently;Understanding of Exercise Prescription Increase Physical Activity;Increase Strength and Stamina;Understanding of Exercise Prescription Increase Physical Activity;Increase Strength and Stamina;Understanding of Exercise Prescription   Comments Reviewed RPE and dyspnea scales, THR and program prescription with pt today.  Pt voiced understanding and was given a copy of goals to take home. Mardee is doing well and is learning how to use all the machines.  She is able to do prescribed workloads.  She has reached [facility] some sessions.  We will monitor progress. Reviewed home exercise with pt today.  Pt plans to walk and use treadmill at home for exercise.  Reviewed THR, pulse, RPE, sign and symptoms, pulse oximetery and when to call 911 or MD.  Also discussed weather  considerations and indoor options.  Pt voiced understanding. Avynn is doing well in rehab. She doing well on her treadmill at home on her off days.  She will usually walk for about 30 min each time.  She is staring to feel better since her stent. Verlinda is doing well in rehab.  She is now using the treadmill some at 2.5 mph.  She is also up to level 4 on the NuStep.  We will continue to montior her progress.   Expected Outcomes Short: Use RPE daily to regulate intensity. Long: Follow program prescription in THR. Short:  attend consistently Long: improve overall stamina Short: Start to add in exercise at home Long: Continue to improve stamina. Short: Continue to use treadmill on off days Long; Continue to improve stamina. Short: Increase T5 NuStep Long: Continue to improve stamina on treadmill    Row Name 04/09/21 1302 04/16/21 0932 04/22/21 1301 05/06/21 1309       Exercise Goal Re-Evaluation   Exercise Goals Review Increase Physical Activity;Increase Strength and Stamina Increase Physical Activity;Increase Strength and Stamina Increase Physical Activity;Increase Strength and Stamina Increase Physical Activity;Increase Strength and Stamina;Understanding of Exercise Prescription    Comments Carigan attends consistently and works in her THR range.  She is slowly progressing workloads on TM and seated machines.  We will continue to monitor progress. Vetta will use the treadmill at home - 35 minutes (warm up and cool down like in rehab) - will do this days when not at rehab. She is also an active person, especially when the weather is nice. Tashaya is doing great in rehab. She is up to 40 laps on the track and even tried the elliptical for 5 minutes! She also increased her treadmill loads to 3 speed/ 2% incline overall. Will continue to monitor. Shady is getting ready to graduate.  She improved her post walk by 48%!!  She is set to continue to exercise by walking at home and is thinking of joining the [employer]  after graduation.  She has really enjoyed attending the program and has appreciated the boost it has given her.    Expected Outcomes Short: maintain consistent attendance Long: improve average MET level ST: continue exercising at home LT: improve average MET level Short: work up tolerance on the elliptical Long: Continue to build up overall strength and stamina Graduate! Continue to exercise independently  Discharge Exercise Prescription (Final Exercise Prescription Changes):  Exercise Prescription Changes - 05/06/21 1300       Response to Exercise   Blood Pressure (Admit) 122/64    Blood Pressure (Exit) 122/64    Heart Rate (Admit) 65 bpm    Heart Rate (Exercise) 124 bpm    Heart Rate (Exit) 98 bpm    Oxygen Saturation (Admit) 98 %    Oxygen Saturation (Exercise) 97 %    Oxygen Saturation (Exit) 96 %    Rating of Perceived Exertion (Exercise) 12    Symptoms none    Duration Continue with 30 min of aerobic exercise without signs/symptoms of physical distress.    Intensity THRR unchanged      Progression   Progression Continue to progress workloads to maintain intensity without signs/symptoms of physical distress.    Average METs 4.01      Resistance Training   Training Prescription Yes    Weight 3 lb    Reps 10-15      Interval Training   Interval Training No      Treadmill   MPH 3.4    Grade 2    Minutes 15    METs 4.54      Recumbant Bike   Level 2    Watts 29    Minutes 15    METs 3.5      T5 Nustep   Level 5    Minutes 15    METs 2.5      Home Exercise Plan   Plans to continue exercise at Home (comment)   walking, treadmill   Frequency Add 2 additional days to program exercise sessions.    Initial Home Exercises Provided 03/15/21      Oxygen   Maintain Oxygen Saturation 88% or higher             Nutrition:  Target Goals: Understanding of nutrition guidelines, daily intake of sodium <1534m, cholesterol <2066m calories 30% from fat  and 7% or less from saturated fats, daily to have 5 or more servings of fruits and vegetables.  Education: All About Nutrition: -Group instruction provided by verbal, written material, interactive activities, discussions, models, and posters to present general guidelines for heart healthy nutrition including fat, fiber, MyPlate, the role of sodium in heart healthy nutrition, utilization of the nutrition label, and utilization of this knowledge for meal planning. Follow up email sent as well. Written material given at graduation. [facility]  Education need identified 02/28/21  Date 05/09/21  Educator MCLog CabinInstruction Review Code 1- Verbalizes Understanding       Biometrics:  Pre Biometrics - 02/28/21 1149       Pre Biometrics   Height 5' 4.4" (1.636 m)    Weight 147 lb 3.2 oz (66.8 kg)    BMI (Calculated) 24.95    Single Leg Stand 30 seconds             Post Biometrics - 04/30/21 0942        Post  Biometrics   Height 5' 4.4" (1.636 m)    Weight 144 lb 4.8 oz (65.5 kg)    BMI (Calculated) 24.46    Single Leg Stand 30 seconds             Nutrition Therapy Plan and Nutrition Goals:  Nutrition Therapy & Goals - 04/02/21 0838       Nutrition Therapy   Diet Heart healthy, low  Na, T2DM    Drug/Food Interactions Statins/Certain Fruits    Protein (specify units) 50g    Fiber 25 grams    Whole Grain Foods 3 servings    Saturated Fats 12 max. grams    Fruits and Vegetables 8 servings/day    Sodium 1.5 grams      Personal Nutrition Goals   Nutrition Goal ST: try using vinegar and/or citrus to add flavor while reducing salt (<1/4 tsp when adding to meals), taste food before salting LT: A1c </= 7, saturated fat <12g/day, Na <1.5g/day, follow MyPlate structure with most meals.    Comments 76 y.o. F admitted to rehab s/p stent placement presenting with CAD, T2DM, hypothyroidism, HLD, anemia, osteoporosis.  Relevant medications include vitamin C, lipitor, calcium/vit D supplement, B12. iron, magnesium, MVI, omega-3, protonix,  glipizide, metformin, synthroid. PYP 69. B: greek yogurt or cottage cheese with fruit and granola S: protein drink before rehab (30g) L: tuna with onions and pickles on crackers or tomato soup or brunswick stew or pimento cheese sandwich or salad when out to eat or baked fish D: same as lunch. She eats lost of different beans w/ vegetable oil. She does not usually salt while cooking. She feels that salt is her weakness, she estimates using 1.5 tsp salt per day in added salt. Drinks: she reports not drinking much (she does not like water) - splenda sweetened iced tea or flavored water. She got mrs. dash, but she does not feel it tastes good. Discussed heart healthy eating and diabetes friendly eating.      Intervention Plan   Intervention Prescribe, educate and counsel regarding individualized specific dietary modifications aiming towards targeted core components such as weight, hypertension, lipid management, diabetes, heart failure and other comorbidities.    Expected Outcomes Short Term Goal: Understand basic principles of dietary content, such as calories, fat, sodium, cholesterol and nutrients.;Short Term Goal: A plan has been developed with personal nutrition goals set during dietitian appointment.;Long Term Goal: Adherence to prescribed nutrition plan.             Nutrition Assessments:  MEDIFICTS Score Key: ?70 Need to make dietary changes  40-70 Heart Healthy Diet ? 40 Therapeutic Level Cholesterol Diet  Flowsheet Row Cardiac Rehab from 04/30/2021 in [facility] Cardiac and Pulmonary Rehab  Picture Your Plate Total Score on Admission 69  Picture Your Plate Total Score on Discharge 60      Picture Your Plate Scores: <70 Unhealthy dietary pattern with much room for improvement. 41-50 Dietary pattern unlikely to meet recommendations for good health and room for  improvement. 51-60 More healthful dietary pattern, with some room for improvement.  >60 Healthy dietary pattern, although there may be some specific behaviors that could be improved.    Nutrition Goals Re-Evaluation:  Nutrition Goals Re-Evaluation     [place] Name 03/19/21 0929 04/16/21 0925           Goals   Nutrition Goal -- ST: try using vinegar and/or citrus to add flavor while reducing salt (<1/4 tsp when adding to meals), taste food before salting LT: A1c </= 7, saturated fat <12g/day, Na <1.5g/day, follow MyPlate structure with most meals.      Comment No goals have been established yet as she has not met with the RD. Appt on 12/13. She reports she uses vinegar and citrus with greens and fish and enjoys that, she does not care for Mrs. Dash. She still will use salt if the recipe calls for it, but now just  a pinch. She has stopped salting her food all together - she says generally it has been bland, but doable. Continue to monitor salt intake and continue to enhnace the flavor of food with blends. Discussed adding flavor by adding flavorful dips/sauces like pesto and tzatiki - she is not interested. She would like to continue with this goal.      Expected Outcome Short: Meet with RD Long: Establish heart healthy diet ST: try using vinegar and/or citrus to add flavor while reducing salt (<1/4 tsp when adding to meals), taste food before salting LT: A1c </= 7, saturated fat <12g/day, Na <1.5g/day, follow MyPlate structure with most meals.               Nutrition Goals Discharge (Final Nutrition Goals Re-Evaluation):  Nutrition Goals Re-Evaluation - 04/16/21 0925       Goals   Nutrition Goal ST: try using vinegar and/or citrus to add flavor while reducing salt (<1/4 tsp when adding to meals), taste food before salting LT: A1c </= 7, saturated fat <12g/day, Na <1.5g/day, follow MyPlate structure with most meals.    Comment She reports she uses vinegar and citrus with greens and fish and  enjoys that, she does not care for Mrs. Dash. She still will use salt if the recipe calls for it, but now just a pinch. She has stopped salting her food all together - she says generally it has been bland, but doable. Continue to monitor salt intake and continue to enhnace the flavor of food with blends. Discussed adding flavor by adding flavorful dips/sauces like pesto and tzatiki - she is not interested. She would like to continue with this goal.    Expected Outcome ST: try using vinegar and/or citrus to add flavor while reducing salt (<1/4 tsp when adding to meals), taste food before salting LT: A1c </= 7, saturated fat <12g/day, Na <1.5g/day, follow MyPlate structure with most meals.             Psychosocial: Target Goals: Acknowledge presence or absence of significant depression and/or stress, maximize coping skills, provide positive support system. Participant is able to verbalize types and ability to use techniques and skills needed for reducing stress and depression.   Education: Stress, Anxiety, and Depression - Group verbal and visual presentation to define topics covered.  Reviews how body is impacted by stress, anxiety, and depression.  Also discusses healthy ways to reduce stress and to treat/manage anxiety and depression.  Written material given at graduation. Flowsheet Row Cardiac Rehab from 05/09/2021 in [facility] Cardiac and Pulmonary Rehab  Date 03/28/21  Educator [facility]  Instruction Review Code 1- Verbalizes Understanding       Education: Sleep Hygiene -Provides group verbal and written instruction about how sleep can affect your health.  Define sleep hygiene, discuss sleep cycles and impact of sleep habits. Review good sleep hygiene tips.    Initial Review & Psychosocial Screening:  Initial Psych Review & Screening - 02/14/21 0948       Initial Review   Current issues with None Identified      Family Dynamics   Good Support System? Yes    Comments Carling can look to her  son, husband and life group fro support. She has a positive outlook on her health.      Barriers   Psychosocial barriers to participate in program The patient should benefit from training in stress management and relaxation.;There are no identifiable barriers or psychosocial needs.      Screening Interventions  Interventions Encouraged to exercise;To provide support and resources with identified psychosocial needs;Provide feedback about the scores to participant    Expected Outcomes Short Term goal: Utilizing psychosocial counselor, staff and physician to assist with identification of specific Stressors or current issues interfering with healing process. Setting desired goal for each stressor or current issue identified.;Long Term Goal: Stressors or current issues are controlled or eliminated.;Short Term goal: Identification and review with participant of any Quality of Life or Depression concerns found by scoring the questionnaire.;Long Term goal: The participant improves quality of Life and PHQ9 Scores as seen by post scores and/or verbalization of changes             Quality of Life Scores:   Quality of Life - 04/30/21 0926       Quality of Life   Select Quality of Life      Quality of Life Scores   Health/Function Pre 28.6 %    Health/Function Post 29.33 %    Health/Function % Change 2.55 %    Socioeconomic Pre 26.43 %    Socioeconomic Post 29.06 %    Socioeconomic % Change  9.95 %    Psych/Spiritual Pre 29.64 %    Psych/Spiritual Post 30 %    Psych/Spiritual % Change 1.21 %    Family Pre 27.6 %    Family Post 26.4 %    Family % Change -4.35 %    GLOBAL Pre 28.22 %    GLOBAL Post 28.99 %    GLOBAL % Change 2.73 %            Scores of 19 and below usually indicate a poorer quality of life in these areas.  A difference of  2-3 points is a clinically meaningful difference.  A difference of 2-3 points in the total score of the Quality of Life Index has been associated  with significant improvement in overall quality of life, self-image, physical symptoms, and general health in studies assessing change in quality of life.  PHQ-9: Recent Review Flowsheet Data     Depression screen [facility] 2/9 04/30/2021 02/28/2021   Decreased Interest 0 0   Down, Depressed, Hopeless 0 0   PHQ - 2 Score 0 0   Altered sleeping 0 0   Tired, decreased energy 0 0   Change in appetite 0 0   Feeling bad or failure about yourself  0 0   Trouble concentrating 0 0   Moving slowly or fidgety/restless 0 0   Suicidal thoughts 0 0   PHQ-9 Score 0 0   Difficult doing work/chores Not difficult at all Not difficult at all      Interpretation of Total Score  Total Score Depression Severity:  1-4 = Minimal depression, 5-9 = Mild depression, 10-14 = Moderate depression, 15-19 = Moderately severe depression, 20-27 = Severe depression   Psychosocial Evaluation and Intervention:  Psychosocial Evaluation - 02/14/21 0950       Psychosocial Evaluation & Interventions   Interventions Encouraged to exercise with the program and follow exercise prescription;Relaxation education;Stress management education    Comments Zuleima can look to her son, husband and life group fro support. She has a positive outlook on her health.    Expected Outcomes Short: Start Heart[facility] to help with mood. Long: Maintain a healthy mental state    Continue Psychosocial Services  Follow up required by staff             Psychosocial Re-Evaluation:  Psychosocial Re-Evaluation  [place] Name 03/19/21 7616 04/16/21 0934           Psychosocial Re-Evaluation   Current issues with None Identified None Identified      Comments Shaquoia is doing well mentally. She denies any big stressor or "drama". No medications for depression or anxiety. Her biggest support is her husband, pastor's wife, and a couple local friends. She has 2 Sons and 2 grandchildren that she sees periodically. She is enjoying the program thus far.  Cyndee reports not having any stress, her husband is recovering well from his stroke. She continues to use her support sytem and does crosswords/sodoku/reading. She is having no issues with sleep - she has sleepy time tea because she likes it and it relaxes her.      Expected Outcomes Short: Continue routine attendance Long: Utilize exercise for stress management and maintain positive attitude Short: Continue routine attendance Long: Utilize exercise for stress management and maintain positive attitude      Interventions Encouraged to attend Cardiac Rehabilitation for the exercise Encouraged to attend Cardiac Rehabilitation for the exercise      Continue Psychosocial Services  Follow up required by staff Follow up required by staff               Psychosocial Discharge (Final Psychosocial Re-Evaluation):  Psychosocial Re-Evaluation - 04/16/21 0934       Psychosocial Re-Evaluation   Current issues with None Identified    Comments Jayne reports not having any stress, her husband is recovering well from his stroke. She continues to use her support sytem and does crosswords/sodoku/reading. She is having no issues with sleep - she has sleepy time tea because she likes it and it relaxes her.    Expected Outcomes Short: Continue routine attendance Long: Utilize exercise for stress management and maintain positive attitude    Interventions Encouraged to attend Cardiac Rehabilitation for the exercise    Continue Psychosocial Services  Follow up required by staff             Vocational Rehabilitation: Provide vocational rehab assistance to qualifying candidates.   Vocational Rehab Evaluation & Intervention:   Education: Education Goals: Education classes will be provided on a variety of topics geared toward better understanding of heart health and risk factor modification. Participant will state understanding/return demonstration of topics presented as noted by education test  scores.  Learning Barriers/Preferences:  Learning Barriers/Preferences - 02/14/21 0947       Learning Barriers/Preferences   Learning Barriers None    Learning Preferences None             General Cardiac Education Topics:  AED/CPR: - Group verbal and written instruction with the use of models to demonstrate the basic use of the AED with the basic ABC's of resuscitation.   Anatomy and Cardiac Procedures: - Group verbal and visual presentation and models provide information about basic cardiac anatomy and function. Reviews the testing methods done to diagnose heart disease and the outcomes of the test results. Describes the treatment choices: Medical Management, Angioplasty, or Coronary Bypass Surgery for treating various heart conditions including Myocardial Infarction, Angina, Valve Disease, and Cardiac Arrhythmias.  Written material given at graduation. Flowsheet Row Cardiac Rehab from 05/09/2021 in [facility] Cardiac and Pulmonary Rehab  Date 04/18/21  Educator SB  Instruction Review Code 1- Verbalizes Understanding       Medication Safety: - Group verbal and visual instruction to review commonly prescribed medications for heart and lung disease. Reviews the medication, class of the  drug, and side effects. Includes the steps to properly store meds and maintain the prescription regimen.  Written material given at graduation. Flowsheet Row Cardiac Rehab from 05/09/2021 in [facility] Cardiac and Pulmonary Rehab  Date 03/05/21  Educator SB  Instruction Review Code 1- Verbalizes Understanding       Intimacy: - Group verbal instruction through game format to discuss how heart and lung disease can affect sexual intimacy. Written material given at graduation.. Flowsheet Row Cardiac Rehab from 05/09/2021 in [facility] Cardiac and Pulmonary Rehab  Date 04/11/21  Educator [facility]  Instruction Review Code 1- Verbalizes Understanding       Know Your Numbers and Heart Failure: - Group verbal and  visual instruction to discuss disease risk factors for cardiac and pulmonary disease and treatment options.  Reviews associated critical values for Overweight/Obesity, Hypertension, Cholesterol, and Diabetes.  Discusses basics of heart failure: signs/symptoms and treatments.  Introduces Heart Failure Zone chart for action plan for heart failure.  Written material given at graduation. Flowsheet Row Cardiac Rehab from 05/09/2021 in [facility] Cardiac and Pulmonary Rehab  Education need identified 02/28/21  Date 03/14/21  Educator SB  Instruction Review Code 1- Verbalizes Understanding       Infection Prevention: - Provides verbal and written material to individual with discussion of infection control including proper hand washing and proper equipment cleaning during exercise session. Flowsheet Row Cardiac Rehab from 05/09/2021 in [facility] Cardiac and Pulmonary Rehab  Date 02/14/21  Educator [facility]  Instruction Review Code 1- Verbalizes Understanding       Falls Prevention: - Provides verbal and written material to individual with discussion of falls prevention and safety. Flowsheet Row Cardiac Rehab from 05/09/2021 in [facility] Cardiac and Pulmonary Rehab  Date 02/14/21  Educator [facility]  Instruction Review Code 1- Verbalizes Understanding       Other: -Provides group and verbal instruction on various topics (see comments)   Knowledge Questionnaire Score:  Knowledge Questionnaire Score - 04/30/21 0925       Knowledge Questionnaire Score   Pre Score 22/26    Post Score 25/26             Core Components/Risk Factors/Patient Goals at Admission:  Personal Goals and Risk Factors at Admission - 02/28/21 1151       Core Components/Risk Factors/Patient Goals on Admission    Weight Management Yes;Weight Loss    Intervention Weight Management: Develop a combined nutrition and exercise program designed to reach desired caloric intake, while maintaining appropriate intake of nutrient and fiber, sodium  and fats, and appropriate energy expenditure required for the weight goal.;Weight Management/Obesity: Establish reasonable short term and long term weight goals.;Weight Management: Provide education and appropriate resources to help participant work on and attain dietary goals.    Admit Weight 147 lb 3.2 oz (66.8 kg)    Goal Weight: Short Term 142 lb (64.4 kg)    Goal Weight: Long Term 135 lb (61.2 kg)    Expected Outcomes Short Term: Continue to assess and modify interventions until short term weight is achieved;Long Term: Adherence to nutrition and physical activity/exercise program aimed toward attainment of established weight goal;Weight Loss: Understanding of general recommendations for a balanced deficit meal plan, which promotes 1-2 lb weight loss per week and includes a negative energy balance of [phone] kcal/d;Understanding recommendations for meals to include 15-35% energy as protein, 25-35% energy from fat, 35-60% energy from carbohydrates, less than 231m of dietary cholesterol, 20-35 gm of total fiber daily;Understanding of distribution of calorie intake throughout the  day with the consumption of 4-5 meals/snacks    Diabetes Yes    Intervention Provide education about signs/symptoms and action to take for hypo/hyperglycemia.;Provide education about proper nutrition, including hydration, and aerobic/resistive exercise prescription along with prescribed medications to achieve blood glucose in normal ranges: Fasting glucose 65-99 mg/dL    Expected Outcomes Short Term: Participant verbalizes understanding of the signs/symptoms and immediate care of hyper/hypoglycemia, proper foot care and importance of medication, aerobic/resistive exercise and nutrition plan for blood glucose control.;Long Term: Attainment of HbA1C < 7%.    Hypertension Yes    Intervention Provide education on lifestyle modifcations including regular physical activity/exercise, weight management, moderate sodium restriction and  increased consumption of fresh fruit, vegetables, and low fat dairy, alcohol moderation, and smoking cessation.;Monitor prescription use compliance.    Expected Outcomes Short Term: Continued assessment and intervention until BP is < 140/37m HG in hypertensive participants. < 130/835mHG in hypertensive participants with diabetes, heart failure or chronic kidney disease.;Long Term: Maintenance of blood pressure at goal levels.    Lipids Yes    Intervention Provide education and support for participant on nutrition & aerobic/resistive exercise along with prescribed medications to achieve LDL <7044mHDL >15m90m  Expected Outcomes Short Term: Participant states understanding of desired cholesterol values and is compliant with medications prescribed. Participant is following exercise prescription and nutrition guidelines.;Long Term: Cholesterol controlled with medications as prescribed, with individualized exercise RX and with personalized nutrition plan. Value goals: LDL < 70mg68mL > 40 mg.             Education:Diabetes - Individual verbal and written instruction to review signs/symptoms of diabetes, desired ranges of glucose level fasting, after meals and with exercise. Acknowledge that pre and post exercise glucose checks will be done for 3 sessions at entry of program. [place] [facility]  Date 02/14/21  [facility]- Verbalizes Understanding       Core Components/Risk Factors/Patient Goals Review:   Goals and Risk Factor Review     Row Name 03/19/21 0930 04/16/21 0935           Core Components/Risk Factors/Patient Goals Review   Personal Goals Review Diabetes;Weight Management/Obesity;Hypertension Diabetes;Weight Management/Obesity;Hypertension      Review ElainShekinahoing well and staying compliant with all her medications. She takes her blood sugars twice/day- denies any abnormal low or high  sugars. She is aware of her symptoms if anything falls into the abnormal range. She would like to lose weight and her goal is to reach 125 lb and looking for diet changes to help achieve that. She weighs herself everyday. She has a BP cuff at home but does not check her BPs- we talked about checking them periodically to ensure they are staying in stable ranges. ElainTenilleks her BG 2x/day 530 am/pm - this morning 87 (usually 80-130); she has not had any abnormal BG recently. She does not check her BP, but has a cuff - encrouaged to check it when not at rehab. She continues to weigh herself daily - she has been weight stable.      Expected Outcomes Short: Check BP at home Long: Continue to manage lifestyle risk factors Short: Check BP at home Long: Continue to manage lifestyle risk factors               Core Components/Risk Factors/Patient Goals at Discharge (Final Review):   Goals and Risk Factor Review -  04/16/21 0935       Core Components/Risk Factors/Patient Goals Review   Personal Goals Review Diabetes;Weight Management/Obesity;Hypertension    Review Asmara checks her BG 2x/day 530 am/pm - this morning 87 (usually 80-130); she has not had any abnormal BG recently. She does not check her BP, but has a cuff - encrouaged to check it when not at rehab. She continues to weigh herself daily - she has been weight stable.    Expected Outcomes Short: Check BP at home Long: Continue to manage lifestyle risk factors             ITP Comments:  ITP Comments     Row Name 02/14/21 0947 02/28/21 1144 03/05/21 0927 03/13/21 0740 04/02/21 0922   ITP Comments Virtual Visit completed. Patient informed on EP and RD appointment and 6 Minute walk test. Patient also informed of patient health questionnaires on My Chart. Patient Verbalizes understanding. Visit diagnosis can be found in [facility] 01/30/2021. Completed 6MWT and gym orientation. Initial ITP created and sent for review to Dr. Emily Filbert, Medical  Director. First full day of exercise!  Patient was oriented to gym and equipment including functions, settings, policies, and procedures.  Patient's individual exercise prescription and treatment plan were reviewed.  All starting workloads were established based on the results of the 6 minute walk test done at initial orientation visit.  The plan for exercise progression was also introduced and progression will be customized based on patient's performance and goals. 30 Day review completed. Medical Director ITP review done, changes made as directed, and signed approval by Medical Director.    New to program Completed intial RD Consultation    Row Name 04/10/21 [id number] 05/08/21 0915 05/09/21 0918       ITP Comments 30 Day review completed. Medical Director ITP review done, changes made as directed, and signed approval by Medical Director. 30 Day review completed. Medical Director ITP review done, changes made as directed, and signed approval by Medical Director. Ellionna graduated today from  rehab with 36 sessions completed.  Details of the patient's exercise prescription and what She needs to do in order to continue the prescription and progress were discussed with patient.  Patient was given a copy of prescription and goals.  Patient verbalized understanding.  Reverie plans to continue to exercise by walking on the treadmill at home.              Comments: discharge ITP

## 2021-09-04 ENCOUNTER — Other Ambulatory Visit: Payer: Self-pay | Admitting: Internal Medicine

## 2021-09-04 DIAGNOSIS — Z1231 Encounter for screening mammogram for malignant neoplasm of breast: Secondary | ICD-10-CM

## 2021-10-08 ENCOUNTER — Ambulatory Visit
Admission: RE | Admit: 2021-10-08 | Discharge: 2021-10-08 | Disposition: A | Discharge status: Home / Self Care | Payer: Medicare Other | Source: Ambulatory Visit | Attending: Internal Medicine | Admitting: Internal Medicine

## 2021-10-08 DIAGNOSIS — Z1231 Encounter for screening mammogram for malignant neoplasm of breast: Secondary | ICD-10-CM | POA: Diagnosis present

## 2022-04-23 IMAGING — MG MM DIGITAL DIAGNOSTIC UNILAT*R* W/ TOMO W/ CAD
4 series · 4 of 12 positions shown · non-contrast
Comparison: Previous exam(s).

CLINICAL DATA: Patient recalled from screening for right breast
mass.

EXAM:
DIGITAL DIAGNOSTIC UNILATERAL RIGHT MAMMOGRAM WITH TOMOSYNTHESIS AND
CAD
TECHNIQUE: Right digital diagnostic mammography and breast tomosynthesis was
performed. The images were evaluated with computer-aided detection.

[R CC synth-2D]
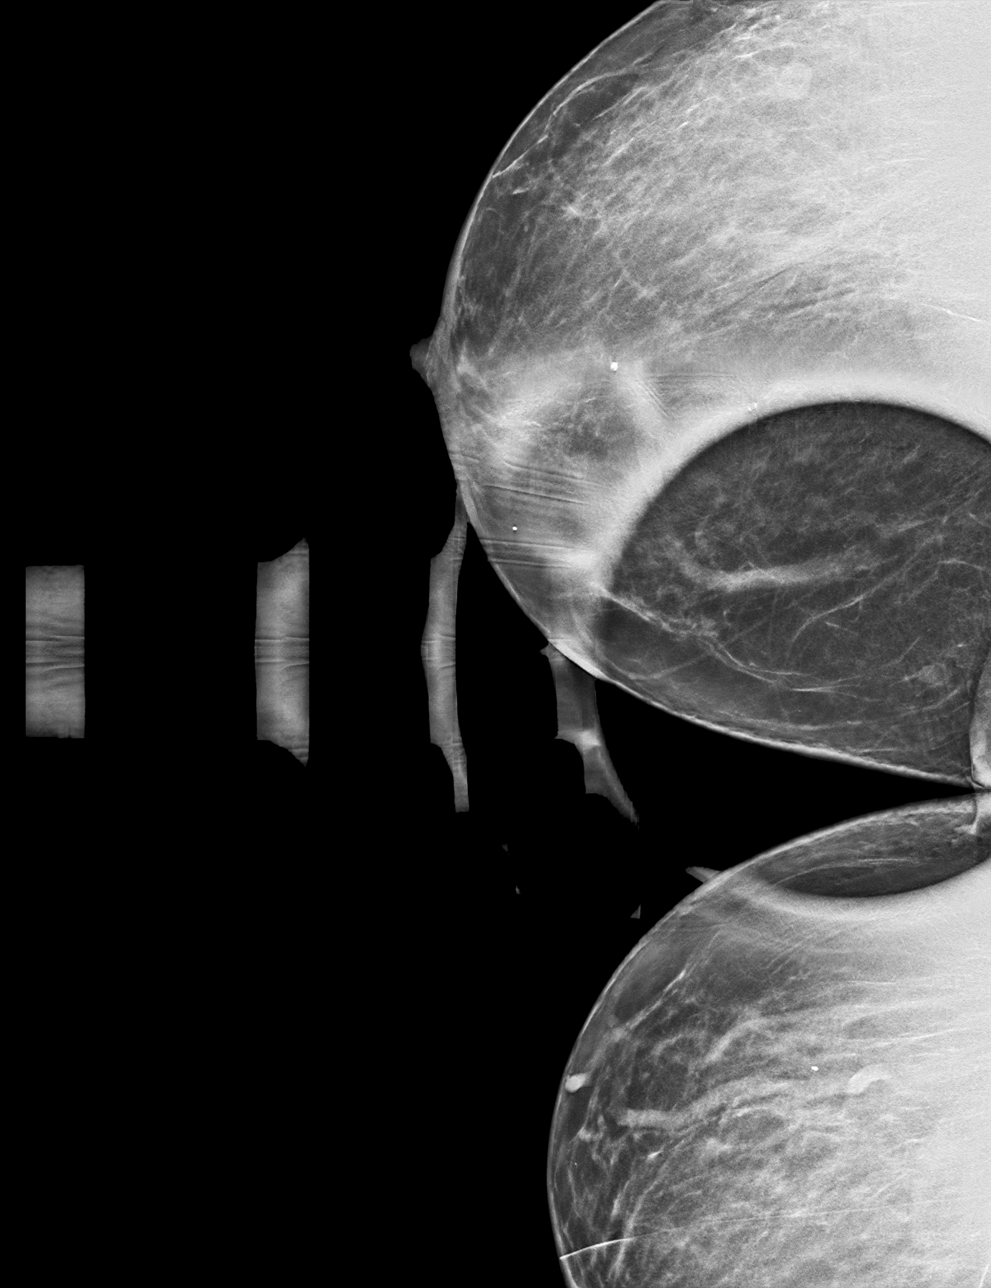

[R MLO synth-2D]
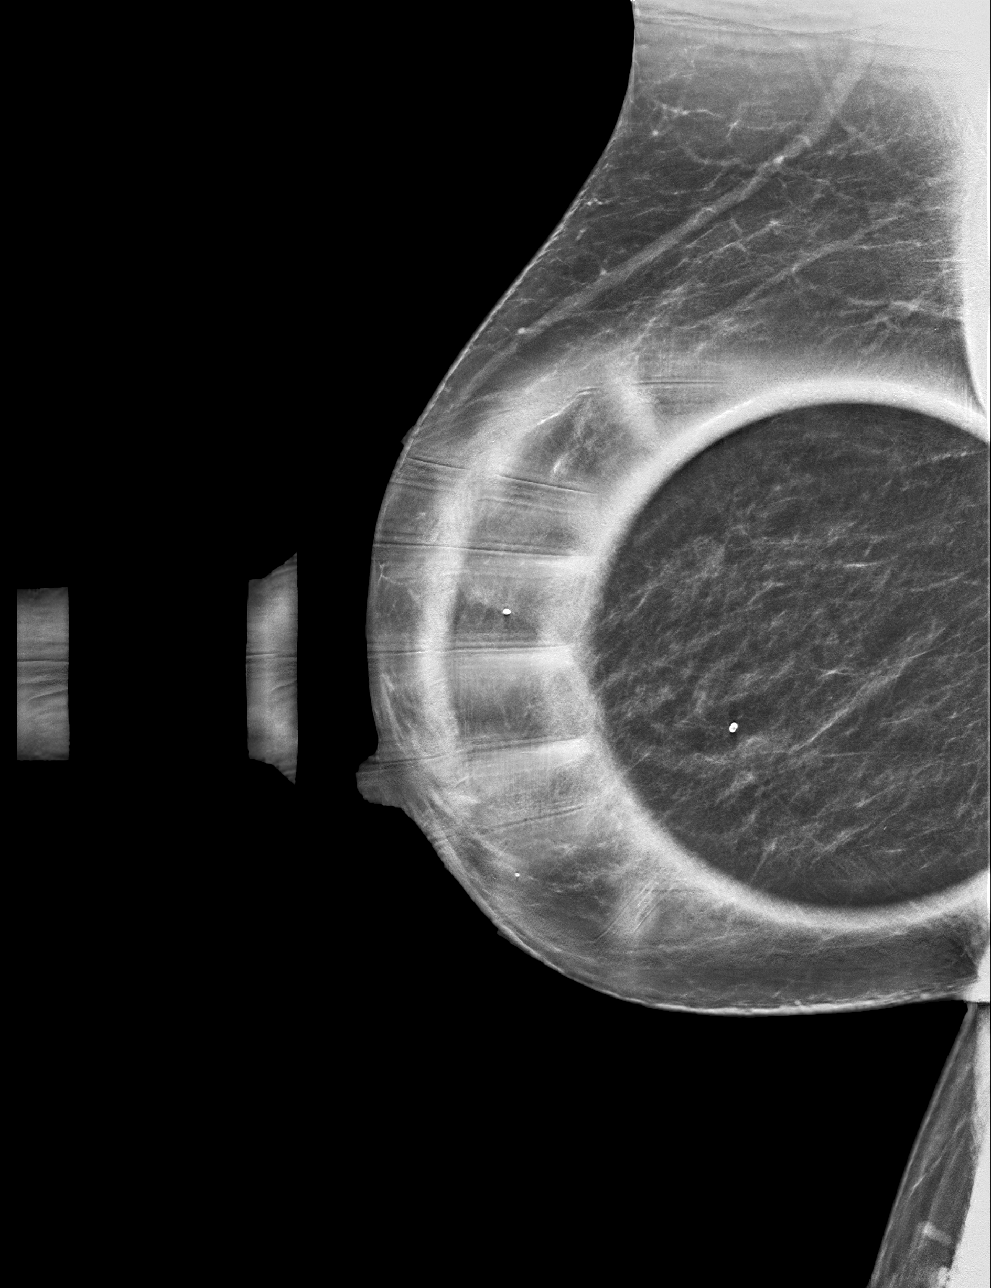

[R MLO tomo · tomo slice 29/57.0]
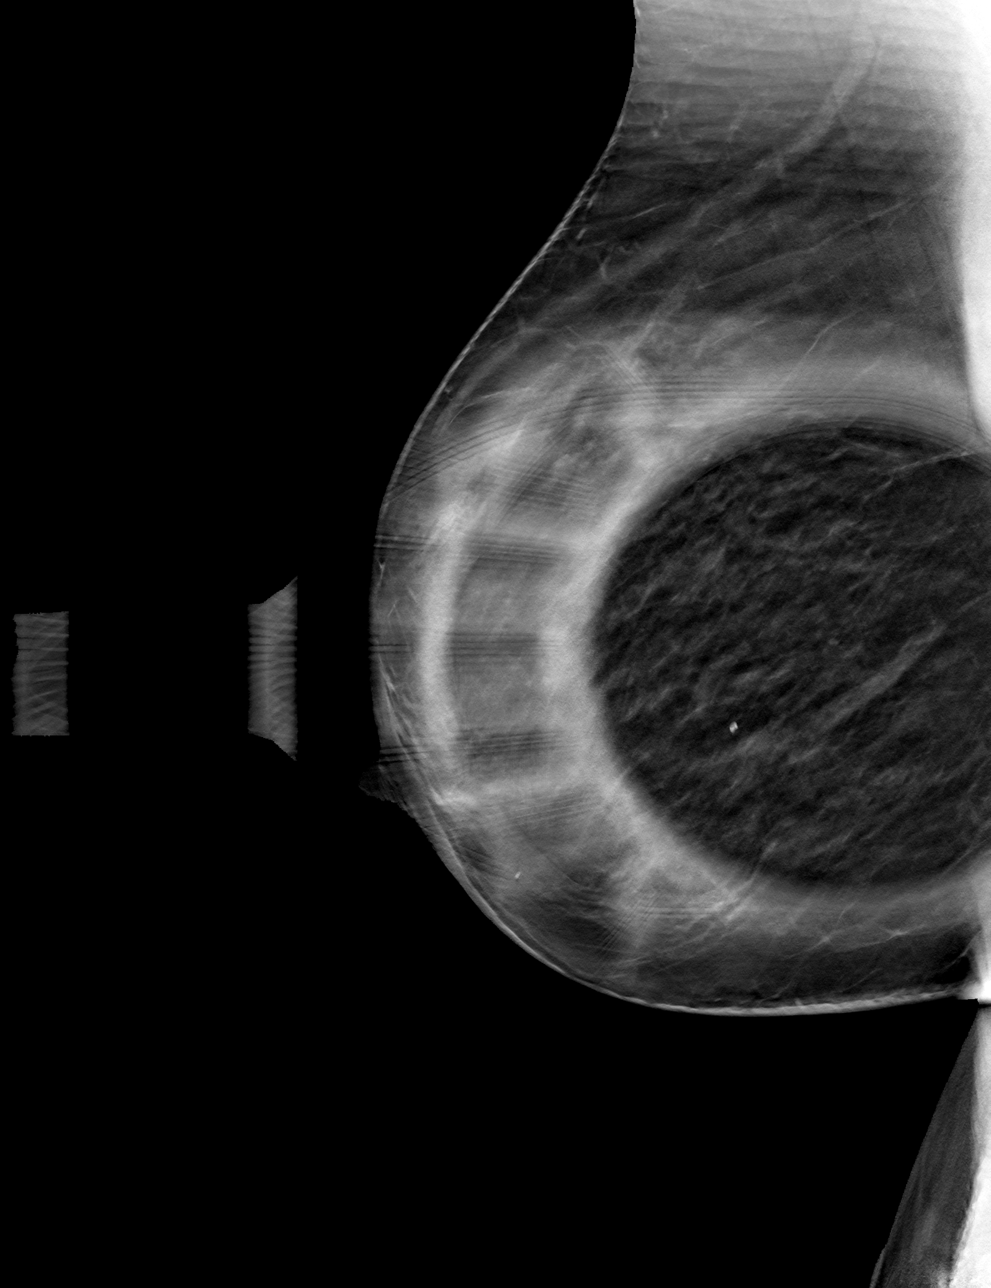

[R CC tomo · tomo slice 25/48.0]
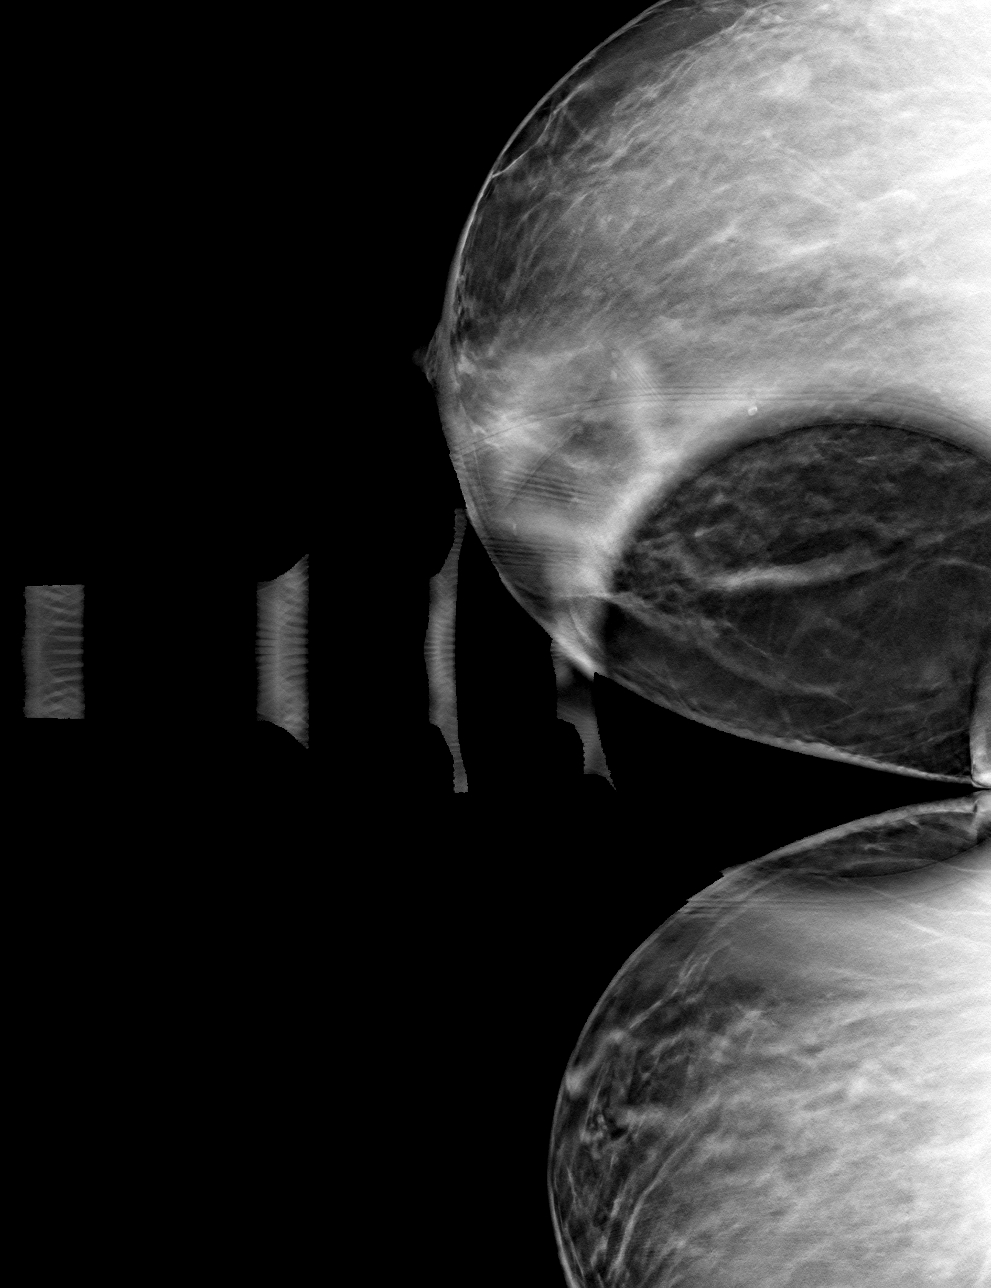

[4 of 12 positions shown; findings below may reference images not displayed]

ACR Breast Density Category b: There are scattered areas of
fibroglandular density.
FINDINGS: Additional imaging of the right breast demonstrates a persistent
oval circumscribed mass within the far medial posterior right
breast. This is stable when compared with multiple prior exams,
compatible with benign process.
IMPRESSION: No mammographic evidence for malignancy.

RECOMMENDATION:
Screening mammogram in one year.(Code:[id number])

I have discussed the findings and recommendations with the patient.
If applicable, a reminder letter will be sent to the patient
regarding the next appointment.

BI-RADS CATEGORY  2: Benign.

## 2022-06-09 ENCOUNTER — Encounter: Payer: Self-pay | Admitting: Otolaryngology

## 2022-06-10 ENCOUNTER — Encounter
Admission: RE | Admit: 2022-06-10 | Discharge: 2022-06-10 | Disposition: A | Discharge status: Home / Self Care | Payer: Medicare Other | Source: Ambulatory Visit | Attending: Otolaryngology | Admitting: Otolaryngology

## 2022-06-10 DIAGNOSIS — Z01812 Encounter for preprocedural laboratory examination: Secondary | ICD-10-CM | POA: Diagnosis present

## 2022-06-10 DIAGNOSIS — I200 Unstable angina: Secondary | ICD-10-CM | POA: Diagnosis not present

## 2022-06-10 LAB — POTASSIUM: Potassium: 3.8 mmol/L (ref 3.5–5.1)

## 2022-06-12 NOTE — Discharge Instructions (Signed)
[facility] ENDOSCOPIC SINUS SURGERY [facility]  What is Functional Endoscopic Sinus Surgery?  The Surgery involves making the natural openings of the sinuses larger by removing the bony partitions that separate the sinuses from the nasal cavity.  The natural sinus lining is preserved as much as possible to allow the sinuses to resume normal function after the surgery.  In some patients nasal polyps (excessively swollen lining of the sinuses) may be removed to relieve obstruction of the sinus openings.  The surgery is performed through the nose using lighted scopes, which eliminates the need for incisions on the face.  A septoplasty is a different procedure which is sometimes performed with sinus surgery.  It involves straightening the boy partition that separates the two sides of your nose.  A crooked or deviated septum may need repair if is obstructing the sinuses or nasal airflow.  Turbinate reduction is also often performed during sinus surgery.  The turbinates are bony proturberances from the side walls of the nose which swell and can obstruct the nose in patients with sinus and allergy problems.  Their size can be surgically reduced to help relieve nasal obstruction.  What Can Sinus Surgery Do For Me?  Sinus surgery can reduce the frequency of sinus infections requiring antibiotic treatment.  This can provide improvement in nasal congestion, post-nasal drainage, facial pressure and nasal obstruction.  Surgery will NOT prevent you from ever having an infection again, so it usually only for patients who get infections 4 or more times yearly requiring antibiotics, or for infections that do not clear with antibiotics.  It will not cure nasal allergies, so patients with allergies may still require medication to treat their allergies after surgery. Surgery may improve headaches related to sinusitis, however, some people will continue to  require medication to control sinus headaches related to allergies.  Surgery will do nothing for other forms of headache (migraine, tension or cluster).  What Are the Risks of Endoscopic Sinus Surgery?  Current techniques allow surgery to be performed safely with little risk, however, there are rare complications that patients should be aware of.  Because the sinuses are located around the eyes, there is risk of eye injury, including blindness, though again, this would be quite rare. This is usually a result of bleeding behind the eye during surgery, which can effect vision, though there are treatments to protect the vision and prevent permanent injury. More serious complications would include bleeding inside the brain cavity or damage to the brain.This happens when the fluid around the brain leaks out into the sinus cavity.  Again, all of these complications are uncommon, and spinal fluid leaks can be safely managed surgically if they occur.  The most common complication of sinus surgery is bleeding from the nose, which may require packing or cauterization of the nose.  Patients with polyps may experience recurrence of the polyps that would require revision surgery.  Alterations of sense of smell or injury to the tear ducts are also rare complications.   What is the Surgery Like, and what is the Recovery?  The Surgery usually takes a couple of hours to perform, and is usually performed under a general anesthetic (completely asleep).  Patients are usually discharged home after a couple of hours.  Sometimes during surgery it is necessary to pack the nose to control bleeding, and the packing is left in place for 24 - 48 hours, and removed by your surgeon.  If   a septoplasty was performed during the procedure, there is often a splint placed which must be removed after 5-7 days.   Discomfort: Pain is usually mild to moderate, and can be controlled by prescription pain medication or acetaminophen (Tylenol).   Aspirin, Ibuprofen (Advil, Motrin), or Naprosyn (Aleve) should be avoided, as they can cause increased bleeding.  Most patients feel sinus pressure like they have a bad head cold for several days.  Sleeping with your head elevated can help reduce swelling and facial pressure, as can ice packs over the face.  A humidifier may be helpful to keep the mucous and blood from drying in the nose.   Diet: There are no specific diet restrictions, however, you should generally start with clear liquids and a light diet of bland foods because the anesthetic can cause some nausea.  Advance your diet depending on how your stomach feels.  Taking your pain medication with food will often help reduce stomach upset which pain medications can cause.  Nasal Saline Irrigation: It is important to remove blood clots and dried mucous from the nose as it is healing.  This is done by having you irrigate the nose at least 3 - 4 times daily with a salt water solution.  We recommend using NeilMed Sinus Rinse (available at the drug store).  Fill the squeeze bottle with the solution, bend over a sink, and insert the tip of the squeeze bottle into the nose  of an inch.  Point the tip of the squeeze bottle towards the inside corner of the eye on the same side your irrigating.  Squeeze the bottle and gently irrigate the nose.  If you bend forward as you do this, most of the fluid will flow back out of the nose, instead of down your throat.   The solution should be warm, near body temperature, when you irrigate.   Each time you irrigate, you should use a full squeeze bottle.   Note that if you are instructed to use Nasal Steroid Sprays at any time after your surgery, irrigate with saline BEFORE using the steroid spray, so you do not wash it all out of the nose. Another product, Nasal Saline Gel (such as AYR Nasal Saline Gel) can be applied in each nostril 3 - 4 times daily to moisture the nose and reduce scabbing or crusting.  Bleeding:   Bloody drainage from the nose can be expected for several days, and patients are instructed to irrigate their nose frequently with salt water to help remove mucous and blood clots.  The drainage may be dark red or brown, though some fresh blood may be seen intermittently, especially after irrigation.  Do not blow you nose, as bleeding may occur. If you must sneeze, keep your mouth open to allow air to escape through your mouth.  If heavy bleeding occurs: Irrigate the nose with saline to rinse out clots, then spray the nose 3 - 4 times with Afrin Nasal Decongestant Spray.  The spray will constrict the blood vessels to slow bleeding.  Pinch the lower half of your nose shut to apply pressure, and lay down with your head elevated.  Ice packs over the nose may help as well. If bleeding persists despite these measures, you should notify your doctor.  Do not use the Afrin routinely to control nasal congestion after surgery, as it can result in worsening congestion and may affect healing.     Activity: Return to work varies among patients. Most patients will be out   of work at least 5 - 7 days to recover.  Patient may return to work after they are off of narcotic pain medication, and feeling well enough to perform the functions of their job.  Patients must avoid heavy lifting (over 10 pounds) or strenuous physical for 2 weeks after surgery, so your employer may need to assign you to light duty, or keep you out of work longer if light duty is not possible.  NOTE: you should not drive, operate dangerous machinery, do any mentally demanding tasks or make any important legal or financial decisions while on narcotic pain medication and recovering from the general anesthetic.    Call Your Doctor Immediately if You Have Any of the Following: Bleeding that you cannot control with the above measures Loss of vision, double vision, bulging of the eye or black eyes. Fever over 101 degrees Neck stiffness with severe headache,  fever, nausea and change in mental state. You are always encouraged to call anytime with concerns, however, please call with requests for pain medication refills during office hours.  Office Endoscopy: During follow-up visits your doctor will remove any packing or splints that may have been placed and evaluate and clean your sinuses endoscopically.  Topical anesthetic will be used to make this as comfortable as possible, though you may want to take your pain medication prior to the visit.  How often this will need to be done varies from patient to patient.  After complete recovery from the surgery, you may need follow-up endoscopy from time to time, particularly if there is concern of recurrent infection or nasal polyps.  

## 2022-06-17 ENCOUNTER — Ambulatory Visit: Payer: Medicare Other | Admitting: Anesthesiology

## 2022-06-17 ENCOUNTER — Ambulatory Visit
Admission: RE | Admit: 2022-06-17 | Discharge: 2022-06-17 | Disposition: A | Discharge status: Home / Self Care | Payer: Medicare Other | Attending: Otolaryngology | Admitting: Otolaryngology

## 2022-06-17 ENCOUNTER — Encounter: Payer: Self-pay | Admitting: Otolaryngology

## 2022-06-17 ENCOUNTER — Encounter
Admission: RE | Disposition: A | Discharge status: Home / Self Care | Payer: Self-pay | Source: Home / Self Care | Attending: Otolaryngology

## 2022-06-17 ENCOUNTER — Other Ambulatory Visit: Payer: Self-pay

## 2022-06-17 DIAGNOSIS — J320 Chronic maxillary sinusitis: Secondary | ICD-10-CM | POA: Diagnosis not present

## 2022-06-17 DIAGNOSIS — Z85828 Personal history of other malignant neoplasm of skin: Secondary | ICD-10-CM | POA: Diagnosis not present

## 2022-06-17 DIAGNOSIS — Z01812 Encounter for preprocedural laboratory examination: Secondary | ICD-10-CM

## 2022-06-17 DIAGNOSIS — Z79899 Other long term (current) drug therapy: Secondary | ICD-10-CM

## 2022-06-17 DIAGNOSIS — E039 Hypothyroidism, unspecified: Secondary | ICD-10-CM | POA: Diagnosis not present

## 2022-06-17 DIAGNOSIS — I2510 Atherosclerotic heart disease of native coronary artery without angina pectoris: Secondary | ICD-10-CM | POA: Diagnosis not present

## 2022-06-17 DIAGNOSIS — Z90710 Acquired absence of both cervix and uterus: Secondary | ICD-10-CM | POA: Insufficient documentation

## 2022-06-17 DIAGNOSIS — Z87891 Personal history of nicotine dependence: Secondary | ICD-10-CM | POA: Diagnosis not present

## 2022-06-17 DIAGNOSIS — E119 Type 2 diabetes mellitus without complications: Secondary | ICD-10-CM | POA: Diagnosis not present

## 2022-06-17 DIAGNOSIS — Z7984 Long term (current) use of oral hypoglycemic drugs: Secondary | ICD-10-CM | POA: Insufficient documentation

## 2022-06-17 DIAGNOSIS — J339 Nasal polyp, unspecified: Secondary | ICD-10-CM | POA: Diagnosis present

## 2022-06-17 DIAGNOSIS — I200 Unstable angina: Secondary | ICD-10-CM

## 2022-06-17 DIAGNOSIS — E7800 Pure hypercholesterolemia, unspecified: Secondary | ICD-10-CM | POA: Diagnosis not present

## 2022-06-17 DIAGNOSIS — Z955 Presence of coronary angioplasty implant and graft: Secondary | ICD-10-CM | POA: Diagnosis not present

## 2022-06-17 HISTORY — PX: MAXILLARY ANTROSTOMY: SHX2003

## 2022-06-17 HISTORY — PX: IMAGE GUIDED SINUS SURGERY: SHX6570

## 2022-06-17 HISTORY — DX: Family history of other specified conditions: Z84.89

## 2022-06-17 HISTORY — PX: POLYPECTOMY: SHX149

## 2022-06-17 HISTORY — DX: Motion sickness, initial encounter: T75.3XXA

## 2022-06-17 LAB — GLUCOSE, CAPILLARY: Glucose-Capillary: 125 mg/dL — ABNORMAL HIGH (ref 70–99)

## 2022-06-17 SURGERY — SINUS SURGERY, WITH IMAGING GUIDANCE
Anesthesia: General | Site: Nose | Laterality: Bilateral

## 2022-06-17 MED ORDER — ACETAMINOPHEN 10 MG/ML IV SOLN: 1000.0000 mg | Freq: Once | INTRAVENOUS | Status: DC | PRN

## 2022-06-17 MED ORDER — SUCCINYLCHOLINE CHLORIDE 200 MG/10ML IV SOSY
PREFILLED_SYRINGE | INTRAVENOUS | Status: DC | PRN
  Administered 2022-06-17: 100 mg via INTRAVENOUS

## 2022-06-17 MED ORDER — HYDROCODONE-ACETAMINOPHEN 5-325 MG PO TABS: 1.0000 | ORAL_TABLET | Freq: Four times a day (QID) | ORAL | 0 refills | Status: DC | PRN

## 2022-06-17 MED ORDER — MIDAZOLAM HCL 5 MG/5ML IJ SOLN
INTRAMUSCULAR | Status: DC | PRN
  Administered 2022-06-17: 2 mg via INTRAVENOUS

## 2022-06-17 MED ORDER — OXYCODONE HCL 5 MG PO TABS: 5.0000 mg | ORAL_TABLET | Freq: Once | ORAL | Status: DC | PRN

## 2022-06-17 MED ORDER — LACTATED RINGERS IV SOLN: INTRAVENOUS | Status: DC

## 2022-06-17 MED ORDER — FENTANYL CITRATE (PF) 100 MCG/2ML IJ SOLN
INTRAMUSCULAR | Status: DC | PRN
  Administered 2022-06-17 (×2): 50 ug via INTRAVENOUS

## 2022-06-17 MED ORDER — PROPOFOL 10 MG/ML IV BOLUS
INTRAVENOUS | Status: DC | PRN
  Administered 2022-06-17: 100 mg via INTRAVENOUS

## 2022-06-17 MED ORDER — PROMETHAZINE HCL 25 MG/ML IJ SOLN: 6.2500 mg | INTRAMUSCULAR | Status: DC | PRN

## 2022-06-17 MED ORDER — PHENYLEPHRINE HCL (PRESSORS) 10 MG/ML IV SOLN
INTRAVENOUS | Status: DC | PRN
  Administered 2022-06-17 (×2): 100 ug via INTRAVENOUS

## 2022-06-17 MED ORDER — OXYCODONE HCL 5 MG/5ML PO SOLN: 5.0000 mg | Freq: Once | ORAL | Status: DC | PRN

## 2022-06-17 MED ORDER — LIDOCAINE HCL (CARDIAC) PF 100 MG/5ML IV SOSY
PREFILLED_SYRINGE | INTRAVENOUS | Status: DC | PRN
  Administered 2022-06-17: 40 mg via INTRAVENOUS

## 2022-06-17 MED ORDER — DOXYCYCLINE HYCLATE 100 MG PO CAPS: ORAL_CAPSULE | ORAL | 0 refills | Status: DC

## 2022-06-17 MED ORDER — OXYMETAZOLINE HCL 0.05 % NA SOLN
NASAL | Status: DC | PRN
  Administered 2022-06-17: 1 via TOPICAL

## 2022-06-17 MED ORDER — LIDOCAINE-EPINEPHRINE 1 %-1:100000 IJ SOLN
INTRAMUSCULAR | Status: DC | PRN
  Administered 2022-06-17: 7 mL

## 2022-06-17 MED ORDER — DROPERIDOL 2.5 MG/ML IJ SOLN: 0.6250 mg | Freq: Once | INTRAMUSCULAR | Status: DC | PRN

## 2022-06-17 MED ORDER — EPHEDRINE SULFATE (PRESSORS) 50 MG/ML IJ SOLN
INTRAMUSCULAR | Status: DC | PRN
  Administered 2022-06-17 (×4): 10 mg via INTRAVENOUS
  Administered 2022-06-17: 5 mg via INTRAVENOUS

## 2022-06-17 MED ORDER — HYDROCODONE-ACETAMINOPHEN 5-325 MG PO TABS: 1.0000 | ORAL_TABLET | Freq: Four times a day (QID) | ORAL | Status: DC | PRN

## 2022-06-17 MED ORDER — FENTANYL CITRATE PF 50 MCG/ML IJ SOSY: 25.0000 ug | PREFILLED_SYRINGE | INTRAMUSCULAR | Status: DC | PRN

## 2022-06-17 MED ORDER — ONDANSETRON HCL 4 MG/2ML IJ SOLN
INTRAMUSCULAR | Status: DC | PRN
  Administered 2022-06-17: 4 mg via INTRAVENOUS

## 2022-06-17 MED ORDER — ONDANSETRON 4 MG PO TBDP: 4.0000 mg | ORAL_TABLET | Freq: Three times a day (TID) | ORAL | 0 refills | Status: DC | PRN

## 2022-06-17 MED ORDER — LIDOCAINE HCL 4 % EX SOLN
CUTANEOUS | Status: DC | PRN
  Administered 2022-06-17: 2 mL via TOPICAL

## 2022-06-17 MED ORDER — ACETAMINOPHEN 10 MG/ML IV SOLN
INTRAVENOUS | Status: DC | PRN
  Administered 2022-06-17: 1000 mg via INTRAVENOUS

## 2022-06-17 MED ORDER — DEXAMETHASONE SODIUM PHOSPHATE 4 MG/ML IJ SOLN
INTRAMUSCULAR | Status: DC | PRN
  Administered 2022-06-17: 4 mg via INTRAVENOUS

## 2022-06-17 SURGICAL SUPPLY — 26 items
ANTIFOG SOL W/FOAM PAD STRL (MISCELLANEOUS) ×1
BLADE SHAVER TRUDI 4 15 DEG (ENT DISPOSABLE) ×1 IMPLANT
BLADE SHAVER TRUDI STR 4 (ENT DISPOSABLE) ×1 IMPLANT
CABLE TRUDI DISPOSABLE (ENT DISPOSABLE) ×2 IMPLANT
CANISTER SUCT 1200ML W/VALVE (MISCELLANEOUS) ×1 IMPLANT
COAGULATOR SUCT 8FR VV (MISCELLANEOUS) ×1 IMPLANT
ELECT REM PT RETURN 9FT ADLT (ELECTROSURGICAL) ×1
ELECTRODE REM PT RTRN 9FT ADLT (ELECTROSURGICAL) ×1 IMPLANT
GLOVE SURG ENC MOIS LTX SZ7.5 (GLOVE) ×2 IMPLANT
GOWN STRL REUS W/ TWL LRG LVL3 (GOWN DISPOSABLE) ×1 IMPLANT
GOWN STRL REUS W/TWL LRG LVL3 (GOWN DISPOSABLE) ×1
IV NS 500ML (IV SOLUTION) ×1
IV NS 500ML BAXH (IV SOLUTION) ×1 IMPLANT
KIT TURNOVER KIT A (KITS) ×1 IMPLANT
NS IRRIG 500ML POUR BTL (IV SOLUTION) ×1 IMPLANT
PACK ENT CUSTOM (PACKS) ×1 IMPLANT
PACKING NASAL EPIS 4X2.4 XEROG (MISCELLANEOUS) IMPLANT
PATTIES SURGICAL .5 X3 (DISPOSABLE) ×1 IMPLANT
SOLUTION ANTFG W/FOAM PAD STRL (MISCELLANEOUS) IMPLANT
STRAP BODY AND KNEE 60X3 (MISCELLANEOUS) IMPLANT
SYR 10ML LL (SYRINGE) ×1 IMPLANT
TRACKER DISPOSABLE PAITIENT (MISCELLANEOUS) ×1 IMPLANT
TRACKER TRUDI DISPOSABLE (ENT DISPOSABLE) ×1 IMPLANT
TUBING CONNECTING 10 (TUBING) ×1 IMPLANT
TUBING IRRIGATION BIEN-AIR (TUBING) ×1 IMPLANT
WATER STERILE IRR 250ML POUR (IV SOLUTION) IMPLANT

## 2022-06-17 NOTE — Anesthesia Postprocedure Evaluation (Signed)
Anesthesia Post Note  Patient: ELAN TETZLOFF  Procedure(s) Performed: IMAGE GUIDED SINUS SURGERY (Bilateral: Nose) MAXILLARY ANTROSTOMY WITH TISSUE REMOVAL (Bilateral: Nose) POLYPECTOMY NASAL (Bilateral: Nose)  Patient location during evaluation: PACU Anesthesia Type: General Level of consciousness: awake and alert Pain management: pain level controlled Vital Signs Assessment: post-procedure vital signs reviewed and stable Respiratory status: spontaneous breathing, nonlabored ventilation, respiratory function stable and patient connected to nasal cannula oxygen Cardiovascular status: blood pressure returned to baseline and stable Postop Assessment: no apparent nausea or vomiting Anesthetic complications: no   No notable events documented.   Last Vitals:  Vitals:   06/17/22 1145 06/17/22 1157  BP: (!) 171/74 (!) 169/67  Pulse: 79 76  Resp: 14 13  Temp: 36.6 C 36.6 C  SpO2: 99% 100%    Last Pain:  Vitals:   06/17/22 1157  TempSrc:   PainSc: [facility]

## 2022-06-17 NOTE — H&P (Signed)
History and physical reviewed and will be scanned in later. No change in medical status reported by the patient or family, appears stable for surgery. All questions regarding the procedure answered, and patient (or family if a child) expressed understanding of the procedure. ? ?[name] S Hyden Soley ?@TODAY@ ?

## 2022-06-17 NOTE — Op Note (Signed)
06/17/2022  11:10 AM    [name]  [id number]   Pre-Op Diagnosis:  Nasal polyps. Chronic Sinusitis  Post-op Diagnosis: Nasal polyps. Chronic Sinusitis  Procedure:  1)  Image Guided Sinus Surgery,   2)  Bilateral Endoscopic Maxillary Antrostomy with Tissue Removal   3)  Nasal polypectomy      Surgeon:  Riley Nearing  Anesthesia:  General endotracheal  EBL:  less than 50 cc  Complications:  None  Findings: Large bilateral obstructing nasal polyps based off the superior medial aspect of the middle turbinates.  Mucosal edema in the maxillary sinuses.  No gross purulence.  Procedure: After the patient was identified in holding and the benefits of the procedure were reviewed as well as the consent and risks, the patient was taken to the operating room and with the patient in a comfortable supine position,  general orotracheal anesthesia was induced without difficulty.  A proper time-out was performed.  The Trudi image guidance system was set up and calibrated in the normal fashion and felt to be acceptable.  Next 1% Xylocaine with 1:100,000 epinephrine was infiltrated into the inferior turbinates, septum, and anterior middle turbinates bilaterally.  Several minutes were allowed for this to take effect.  Cottonoid pledgets soaked in Afrin were placed into both nasal cavities and left while the patient was prepped and draped in the standard fashion. The image guided suction was calibrated and used to inspect known points in the nasal cavity to assess accuracy of the image guided system. Accuracy was felt to be excellent.   The left nasal cavity was inspected, noted to be filled with a large obstructing polyp.  This was removed with a combination of forceps and use of the microdebrider.  The polyp was followed up to its base which was located on the superior medial aspect of the middle turbinate.  It was completely excised and the base of the polyp cauterized with the suction cautery to  control minor bleeding.  Next, the middle turbinate was medialized and the uncinate process then resected with through-cutting forceps as well as the microdebrider. In this fashion the uncinate was completely removed along with soft tissue and bone of the medial wall of the maxillary sinus to create a large patent maxillary antrostomy. The left maxillary sinus was suctioned to clear secretions.  There was no purulence present.  Attention was then turned to the right side where the same procedure was performed, opening the maxillary sinus as described above and removing a large obstructive nasal polyp that was again noted to be based off the medial aspect of the middle turbinate just is on the opposite side.  The sinus was suctioned with the minimal secretions noted, no purulence.  On the scan there was a calcification in the inferior aspect of the sinus.  A 70 degree scope was used to assess the sinus and no obvious abnormality in this area was visualized.  It was felt that calcification might be calcified appearing dense mucin within the sinus  The nose was suctioned and inspected and bleeding felt to be well-controlled.  There was a small area on the posterior aspect of the left maxillary antrostomy which was cauterized to control some residual oozing. Xerogel absorbable sinus packing was then placed in the middle meatus bilaterally.   The patient was then returned to the anesthesiologist for awakening and taken to recovery room in good condition postoperatively.  Disposition:   PACU and d/c home  Plan: Ice, elevation, narcotic analgesia  and prophylactic antibiotics. Begin sinus irrigations with saline tomorrow, irrigating 3-4 times daily. Return to the office in 7 days.  Return to work in 7-10 days, no strenuous activities for two weeks.   Riley Nearing 06/17/2022 11:10 AM

## 2022-06-17 NOTE — Transfer of Care (Signed)
Immediate Anesthesia Transfer of Care Note  Patient: [name]  Procedure(s) Performed: IMAGE GUIDED SINUS SURGERY (Bilateral: Nose) MAXILLARY ANTROSTOMY WITH TISSUE REMOVAL (Bilateral: Nose) POLYPECTOMY NASAL (Bilateral: Nose)  Patient Location: PACU  Anesthesia Type: No value filed.  Level of Consciousness: awake, alert  and patient cooperative  Airway and Oxygen Therapy: Patient Spontanous Breathing and Patient connected to supplemental oxygen  Post-op Assessment: Post-op Vital signs reviewed, Patient's Cardiovascular Status Stable, Respiratory Function Stable, Patent Airway and No signs of Nausea or vomiting  Post-op Vital Signs: Reviewed and stable  Complications: No notable events documented.

## 2022-06-17 NOTE — Anesthesia Preprocedure Evaluation (Signed)
Anesthesia Evaluation  Patient identified by MRN, date of birth, ID band Patient awake    Reviewed: Allergy & Precautions, H&P , NPO status , Patient's Chart, lab work & pertinent test results, reviewed documented beta blocker date and time   Airway Mallampati: II  TM Distance: >3 FB Neck ROM: full    Dental  (+) Teeth Intact   Pulmonary neg pulmonary ROS, former smoker   Pulmonary exam normal        Cardiovascular Exercise Tolerance: Good + angina with exertion + CAD and + Cardiac Stents  (-) Past MI Normal cardiovascular exam Rhythm:regular Rate:Normal     Neuro/Psych negative neurological ROS  negative psych ROS   GI/Hepatic negative GI ROS, Neg liver ROS,,,  Endo/Other  diabetes, Well ControlledHypothyroidism    Renal/GU negative Renal ROS  negative genitourinary   Musculoskeletal   Abdominal   Peds  Hematology negative hematology ROS (+)   Anesthesia Other Findings Past Medical History: 09/2013: Cancer [facility])     Comment:  skin, right elbow 03/2014: Cancer ([place])     Comment:  skin, forehead 04/2014: Cancer ([place])     Comment:  skin, right eyebrow No date: Diabetes mellitus without complication ([place]) No date: Family history of adverse reaction to anesthesia     Comment:  Son - slow to wake No date: Hypercholesteremia No date: Hypothyroidism No date: Motion sickness     Comment:  car [place] Past Surgical History: No date: ABDOMINAL HYSTERECTOMY 11/04/2016: CATARACT EXTRACTION W/PHACO; Left     Comment:  Procedure: CATARACT EXTRACTION PHACO AND INTRAOCULAR               LENS PLACEMENT (IOC);  Surgeon: Birder Robson, MD;                Location: [facility] ORS;  Service: Ophthalmology;  Laterality:              Left;  [place] 00:24.5 AP% 13.5 CDE 3.30 Fluid Pack lot #               [id number] 11/25/2016: CATARACT EXTRACTION W/PHACO; Right     Comment:  Procedure: CATARACT EXTRACTION PHACO AND INTRAOCULAR                LENS PLACEMENT (IOC);  Surgeon: Birder Robson, MD;                Location: [facility] ORS;  Service: Ophthalmology;  Laterality:              Right;  [place] 00:32 AP% 15.7 CDE 5.00 Fluid pack lot #               [id number] 01/30/2021: CORONARY STENT INTERVENTION; N/A     Comment:  Procedure: CORONARY STENT INTERVENTION;  Surgeon: Andrez Grime, MD;  Location: [place] [facility];                Service: Cardiovascular;  Laterality: N/A; No date: CTR     Comment:  release of tendon only 01/30/2021: LEFT HEART CATH AND CORONARY ANGIOGRAPHY; N/A     Comment:  Procedure: LEFT HEART CATH AND CORONARY ANGIOGRAPHY;                Surgeon: Andrez Grime, MD;  Location: [place] [facility];  Service: Cardiovascular;  Laterality:  N/A; BMI    Body Mass Index: 25.02 kg/m     Reproductive/Obstetrics negative OB ROS                             Anesthesia Physical Anesthesia Plan  ASA: 3  Anesthesia Plan: General ETT   Post-op Pain Management:    Induction:   PONV Risk Score and Plan: 4 or greater  Airway Management Planned:   Additional Equipment:   Intra-op Plan:   Post-operative Plan:   Informed Consent: I have reviewed the patients History and Physical, chart, labs and discussed the procedure including the risks, benefits and alternatives for the proposed anesthesia with the patient or authorized representative who has indicated his/her understanding and acceptance.     Dental Advisory Given  Plan Discussed with: CRNA  Anesthesia Plan Comments:        Anesthesia Quick Evaluation

## 2022-06-17 NOTE — Anesthesia Procedure Notes (Signed)
Procedure Name: Intubation Date/Time: 06/17/2022 10:14 AM  Performed by: Londell Moh, CRNAPre-anesthesia Checklist: Patient identified, Emergency Drugs available, Suction available, Patient being monitored and Timeout performed Patient Re-evaluated:Patient Re-evaluated prior to induction Oxygen Delivery Method: Circle system utilized Preoxygenation: Pre-oxygenation with 100% oxygen Induction Type: IV induction Ventilation: Mask ventilation without difficulty Laryngoscope Size: Mac and 3 Grade View: Grade III Tube type: Oral Rae Tube size: 7.0 mm Number of attempts: 1 Placement Confirmation: ETT inserted through vocal cords under direct vision, positive ETCO2 and breath sounds checked- equal and bilateral Tube secured with: Tape Dental Injury: Teeth and Oropharynx as per pre-operative assessment

## 2022-06-17 NOTE — H&P (Signed)
[name], [place] [id number] 1945/12/28  Date of Admission: '@TODAY'$ @ Admitting Physician: Riley Nearing  Chief Complaint: Chronic sinusitis, nasal polyps  HPI: This 77 y.o. year old female has a history of chronic sinusitis with nasal polyps, unresponsive to medical management. CT showed bilateral maxillary sinusitis with obstructing polyps. No change in symptoms. Still has chronic PND and obstructed breathing from polyps. No recent colds or fever.   Medications:  Medications Prior to Admission  Medication Sig Dispense Refill   acetaminophen (TYLENOL) 500 MG tablet Take 500 mg by mouth 2 (two) times daily as needed for mild pain.     aspirin EC 81 MG tablet Take 81 mg by mouth daily with supper.     atorvastatin (LIPITOR) 80 MG tablet Take 1 tablet (80 mg total) by mouth daily. 30 tablet 11   Calcium Carbonate-Vitamin D (CALCIUM 600+D PO) Take 1 tablet by mouth 2 (two) times daily with a meal.     clotrimazole-betamethasone (LOTRISONE) cream Apply 1 application topically daily as needed for rash.     fenofibrate (TRICOR) 48 MG tablet Take 48 mg by mouth daily with lunch.     ferrous sulfate 325 (65 FE) MG tablet Take 325 mg by mouth daily with supper.     fluticasone (FLONASE) 50 MCG/ACT nasal spray Place 2 sprays into both nostrils daily.     glipiZIDE (GLUCOTROL) 10 MG tablet Take 10 mg by mouth 2 (two) times daily before a meal.     hydrochlorothiazide (HYDRODIURIL) 12.5 MG tablet Take 12.5 mg by mouth every other day.     levothyroxine (SYNTHROID, LEVOTHROID) 125 MCG tablet Take 125 mcg by mouth daily before breakfast. Monday through Saturday     metFORMIN (GLUCOPHAGE) 500 MG tablet Take 500 mg by mouth 2 (two) times daily with a meal.     Multiple Vitamin (MULTIVITAMIN WITH MINERALS) TABS tablet Take 1 tablet by mouth daily with supper.     Multiple Vitamins-Minerals (ICAPS PO) Take 1 tablet by mouth daily with breakfast.     Omega-3 Fatty Acids (FISH OIL) 1000 MG CAPS Take 1,000 mg by  mouth daily with breakfast.     vitamin C (ASCORBIC ACID) 500 MG tablet Take 500 mg by mouth daily with lunch.     Tetrahydrozoline HCl (VISINE OP) Place 1 drop into both eyes daily as needed (eye irritation). (Patient not taking: Reported on 06/17/2022)     vitamin B-12 (CYANOCOBALAMIN) 500 MCG tablet Take 500 mcg by mouth daily with breakfast. 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, right eyebrow   Diabetes mellitus without complication ([place])    Family history of adverse reaction to anesthesia    Son - slow to wake   Hypercholesteremia    Hypothyroidism    Motion sickness    car [place]    Fam Hx:  Family History  Problem Relation  Age of Onset   Cancer Mother 3       cervical   Breast cancer Neg Hx     Soc Hx:  Social History   Socioeconomic History   Marital status: Married    Spouse name: Not on file   Number of children: Not on file   Years of education: Not on file   Highest education level: Not on file  Occupational History   Not on file  Tobacco Use   Smoking status: Former    Packs/day: 0.50    Years: 10.00    Total pack years: 5.00    Types: Cigarettes    Quit date: 04/14/1998    Years since quitting: 24.1   Smokeless tobacco: Never  Vaping Use   Vaping Use: Never used  Substance and Sexual Activity   Alcohol use: No   Drug use: Not on file   Sexual activity: Never  Other Topics Concern   Not on  file  Social History Narrative   Not on file   Social Determinants of Health   Financial Resource Strain: Not on file  Food Insecurity: Not on file  Transportation Needs: Not on file  Physical Activity: Not on file  Stress: Not on file  Social Connections: Not on file  Intimate Partner Violence: Not on file    PSH:  Past Surgical History:  Procedure Laterality Date   ABDOMINAL HYSTERECTOMY     CATARACT EXTRACTION W/PHACO Left 11/04/2016   Procedure: CATARACT EXTRACTION PHACO AND INTRAOCULAR LENS PLACEMENT ([place]);  Surgeon: Birder Robson, MD;  Location: [facility] OR[facility];  Service: Ophthalmology;  Laterality: Left;  [place] 00:24.5 AP% 13.5 CDE 3.30 Fluid Pack lot # [id number]   CATARACT EXTRACTION W/PHACO Right 11/25/2016   Procedure: CATARACT EXTRACTION PHACO AND INTRAOCULAR LENS PLACEMENT ([facility]);  Surgeon: Birder Robson, MD;  Location: [facility];  Service: Ophthalmology;  Laterality: Right;  [place] 00:32 AP% 15.7 CDE 5.00 Fluid pack lot # [id number]   CORONARY STENT INTERVENTION N/A 01/30/2021   Procedure: CORONARY STENT INTERVENTION;  Surgeon: Andrez Grime, MD;  Location: [place] [facility];  Service: Cardiovascular;  Laterality: N/A;   CTR     release of tendon only   LEFT HEART CATH AND CORONARY ANGIOGRAPHY N/A 01/30/2021   Procedure: LEFT HEART CATH AND CORONARY ANGIOGRAPHY;  Surgeon: Andrez Grime, MD;  Location: [place] [facility];  Service: Cardiovascular;  Laterality: N/A;  .   PHYSICAL EXAM  Vitals: Blood pressure (!) 162/69, pulse 78, temperature 97.7 F (36.5 C), temperature source Temporal, resp. rate 16, height 5' 2.5" (1.588 m), weight 63 kg, SpO2 98 %.. General: Well-developed, Well-nourished in no acute distress Mood: Mood and affect well adjusted, pleasant and cooperative. Orientation: Grossly alert and oriented. Vocal Quality: No hoarseness. Communicates verbally. head and Face: NCAT. No facial asymmetry. No visible skin lesions. No significant facial  scars. No tenderness with sinus percussion. Facial strength normal and symmetric. Respiratory: Normal respiratory effort without labored breathing. Lungs CTA bilaterally Cardiovascular: Heart exam shows regular rate and rhythm  ASSESSMENT: Chronic sinusitis, nasal polyposis  PLAN: Bilateral endoscopic maxillary antrostomies with tissue removal, polypectomy. Image guided surgery.   Riley Nearing 06/17/2022 10:04 AM

## 2022-06-18 ENCOUNTER — Encounter: Payer: Self-pay | Admitting: Otolaryngology

## 2022-06-19 LAB — SURGICAL PATHOLOGY

## 2022-07-21 IMAGING — CR DG CHEST 2V
2 series · 2 of 2 positions shown · non-contrast
Comparison: 08/22/2013

CLINICAL DATA: Chest pain

EXAM:
CHEST - 2 VIEW

[chest pa]
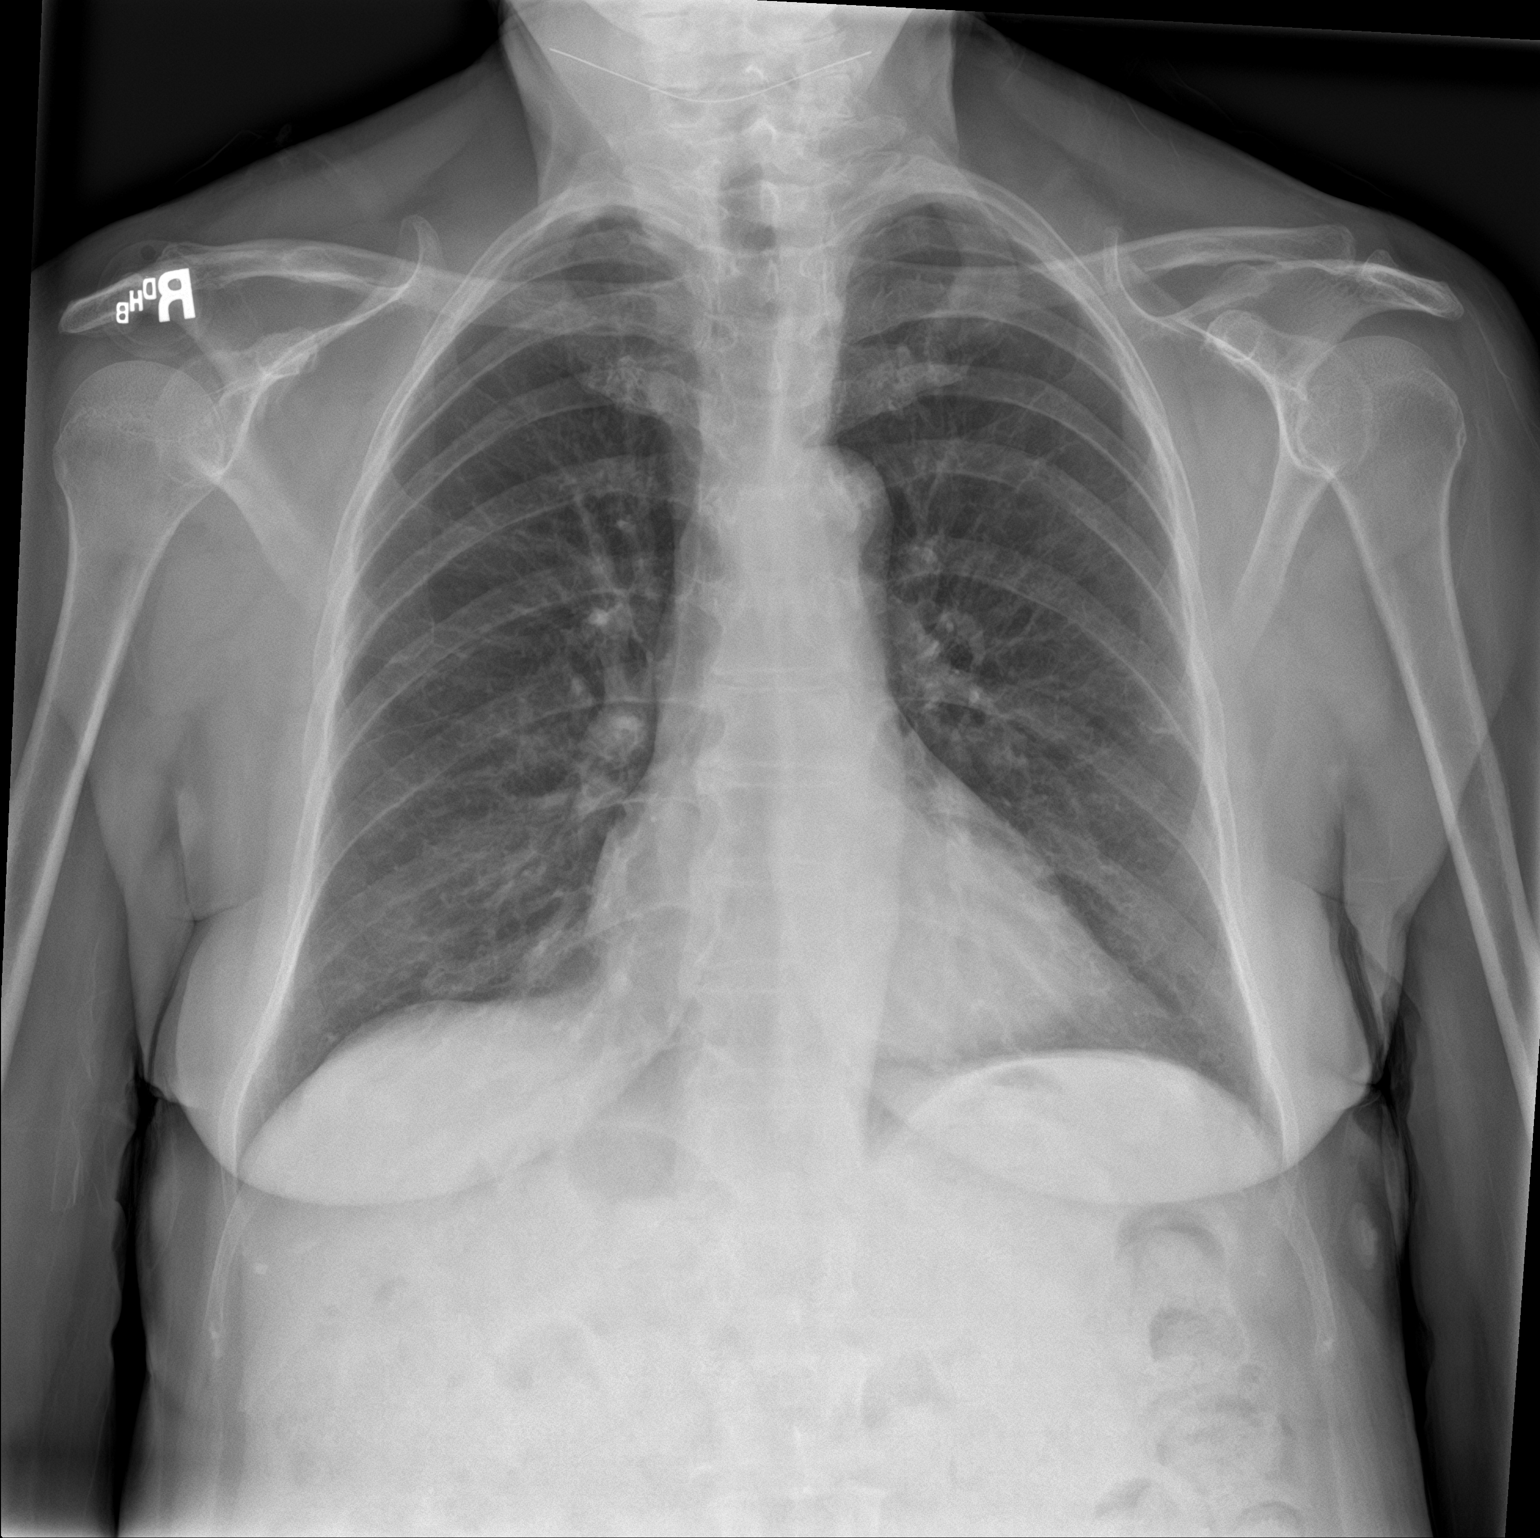

[chest lat]
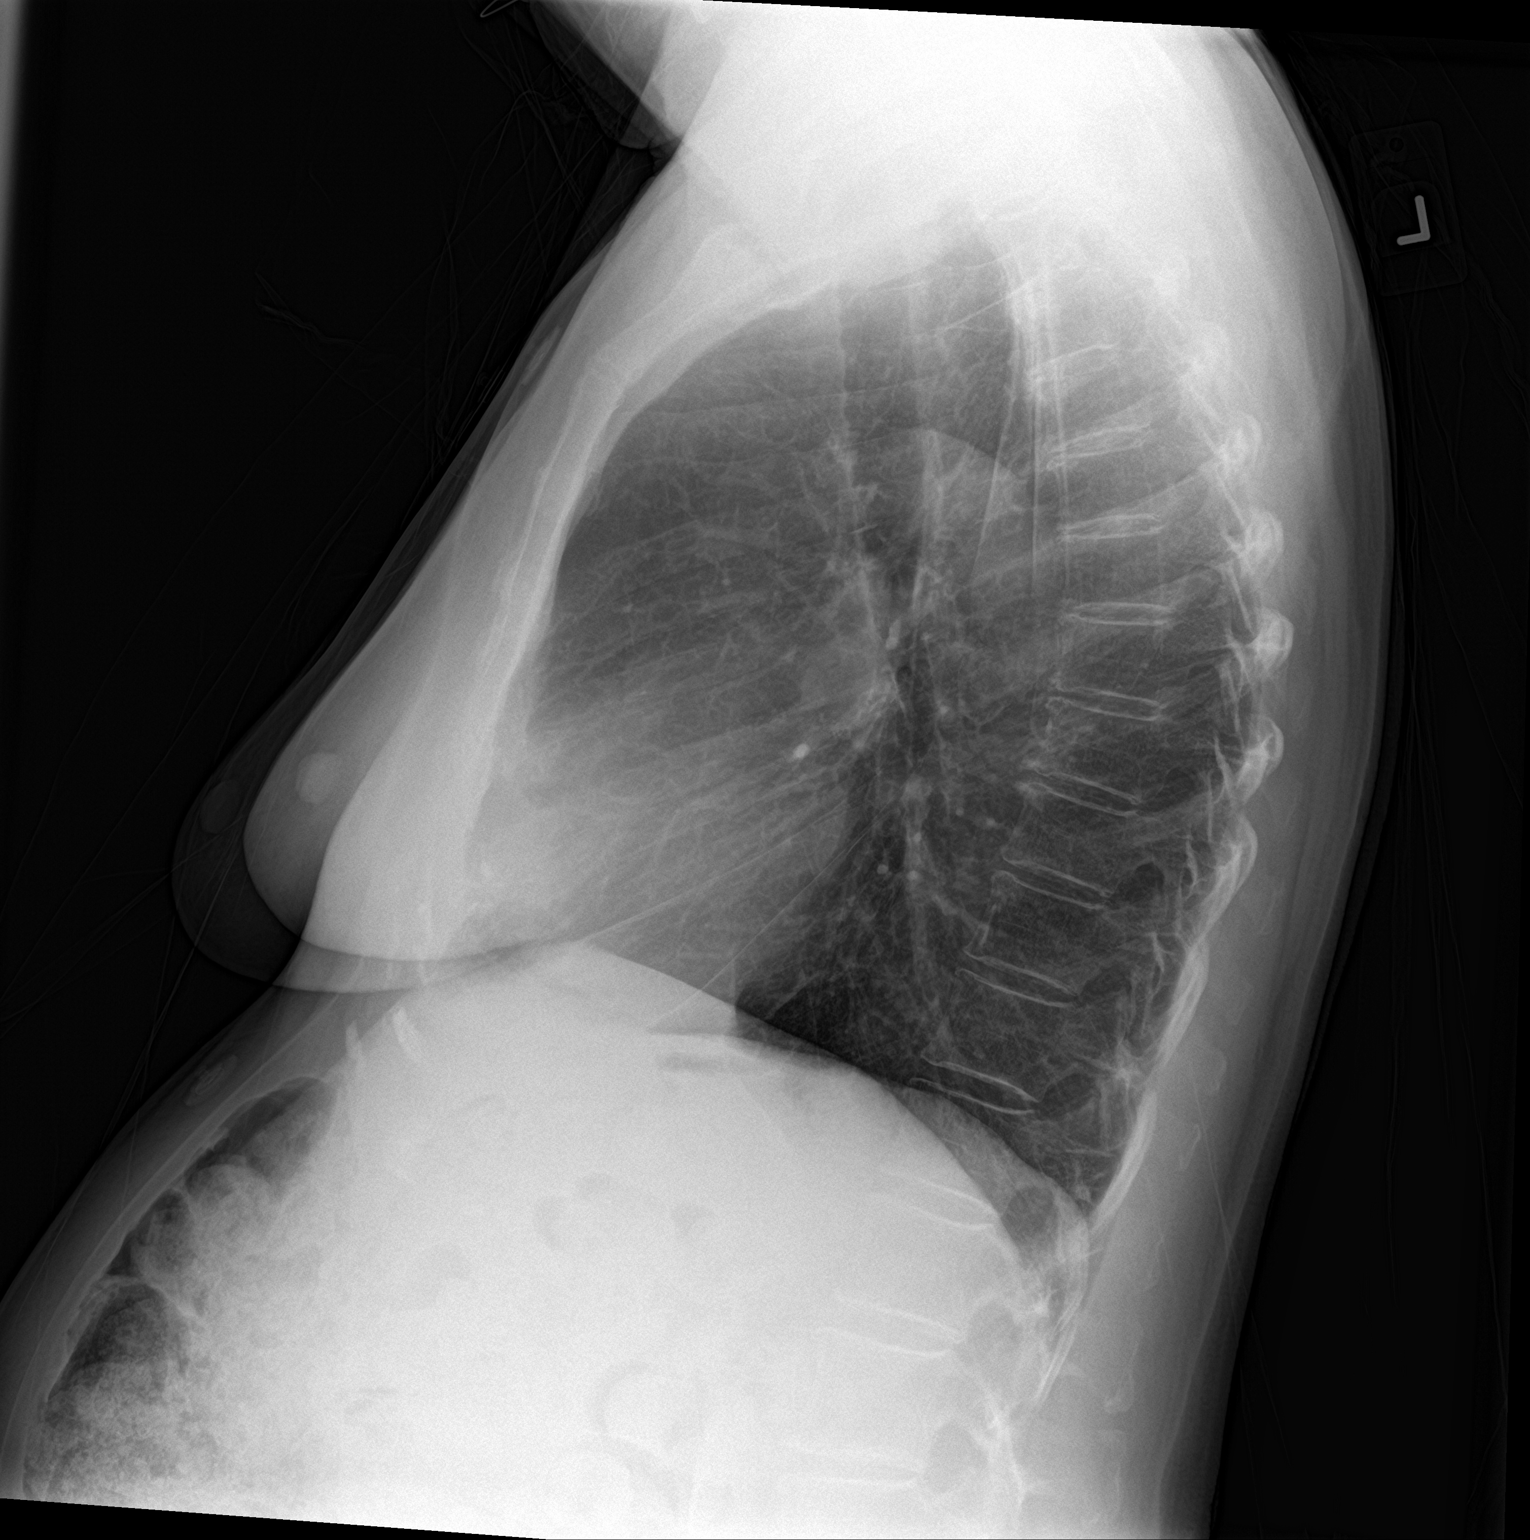

[2 of 2 positions shown; findings below may reference images not displayed]

FINDINGS: The heart size and mediastinal contours are within normal limits.
Both lungs are clear. The visualized skeletal structures are
unremarkable.
IMPRESSION: No active cardiopulmonary disease.

## 2022-08-28 ENCOUNTER — Other Ambulatory Visit: Payer: Self-pay | Admitting: Internal Medicine

## 2022-08-28 DIAGNOSIS — Z1231 Encounter for screening mammogram for malignant neoplasm of breast: Secondary | ICD-10-CM

## 2022-10-10 ENCOUNTER — Ambulatory Visit
Admission: RE | Admit: 2022-10-10 | Discharge: 2022-10-10 | Disposition: A | Discharge status: Home / Self Care | Payer: Medicare Other | Source: Ambulatory Visit | Attending: Internal Medicine | Admitting: Internal Medicine

## 2022-10-10 DIAGNOSIS — Z1231 Encounter for screening mammogram for malignant neoplasm of breast: Secondary | ICD-10-CM | POA: Insufficient documentation

## 2023-04-15 ENCOUNTER — Emergency Department: Payer: Medicare Other

## 2023-04-15 ENCOUNTER — Other Ambulatory Visit: Payer: Self-pay

## 2023-04-15 ENCOUNTER — Inpatient Hospital Stay
Admission: EM | Admit: 2023-04-15 | Discharge: 2023-04-17 | DRG: 872 | Disposition: A | Discharge status: Home / Self Care | Payer: Medicare Other | Attending: Osteopathic Medicine | Admitting: Osteopathic Medicine

## 2023-04-15 ENCOUNTER — Encounter: Payer: Self-pay | Admitting: Emergency Medicine

## 2023-04-15 DIAGNOSIS — Z886 Allergy status to analgesic agent status: Secondary | ICD-10-CM | POA: Diagnosis not present

## 2023-04-15 DIAGNOSIS — Z888 Allergy status to other drugs, medicaments and biological substances status: Secondary | ICD-10-CM | POA: Diagnosis not present

## 2023-04-15 DIAGNOSIS — Z881 Allergy status to other antibiotic agents status: Secondary | ICD-10-CM | POA: Diagnosis not present

## 2023-04-15 DIAGNOSIS — R6520 Severe sepsis without septic shock: Secondary | ICD-10-CM | POA: Diagnosis present

## 2023-04-15 DIAGNOSIS — Z1152 Encounter for screening for COVID-19: Secondary | ICD-10-CM

## 2023-04-15 DIAGNOSIS — Z85828 Personal history of other malignant neoplasm of skin: Secondary | ICD-10-CM

## 2023-04-15 DIAGNOSIS — Z79890 Hormone replacement therapy (postmenopausal): Secondary | ICD-10-CM | POA: Diagnosis not present

## 2023-04-15 DIAGNOSIS — A419 Sepsis, unspecified organism: Principal | ICD-10-CM

## 2023-04-15 DIAGNOSIS — Z79899 Other long term (current) drug therapy: Secondary | ICD-10-CM | POA: Diagnosis not present

## 2023-04-15 DIAGNOSIS — E872 Acidosis, unspecified: Secondary | ICD-10-CM | POA: Diagnosis present

## 2023-04-15 DIAGNOSIS — Z8049 Family history of malignant neoplasm of other genital organs: Secondary | ICD-10-CM

## 2023-04-15 DIAGNOSIS — Z7984 Long term (current) use of oral hypoglycemic drugs: Secondary | ICD-10-CM

## 2023-04-15 DIAGNOSIS — I129 Hypertensive chronic kidney disease with stage 1 through stage 4 chronic kidney disease, or unspecified chronic kidney disease: Secondary | ICD-10-CM | POA: Diagnosis present

## 2023-04-15 DIAGNOSIS — E7800 Pure hypercholesterolemia, unspecified: Secondary | ICD-10-CM | POA: Diagnosis present

## 2023-04-15 DIAGNOSIS — R079 Chest pain, unspecified: Secondary | ICD-10-CM | POA: Diagnosis not present

## 2023-04-15 DIAGNOSIS — Z955 Presence of coronary angioplasty implant and graft: Secondary | ICD-10-CM | POA: Diagnosis not present

## 2023-04-15 DIAGNOSIS — E119 Type 2 diabetes mellitus without complications: Secondary | ICD-10-CM

## 2023-04-15 DIAGNOSIS — E039 Hypothyroidism, unspecified: Secondary | ICD-10-CM | POA: Diagnosis present

## 2023-04-15 DIAGNOSIS — R197 Diarrhea, unspecified: Secondary | ICD-10-CM | POA: Diagnosis present

## 2023-04-15 DIAGNOSIS — D72829 Elevated white blood cell count, unspecified: Secondary | ICD-10-CM

## 2023-04-15 DIAGNOSIS — I200 Unstable angina: Secondary | ICD-10-CM | POA: Diagnosis present

## 2023-04-15 DIAGNOSIS — E1122 Type 2 diabetes mellitus with diabetic chronic kidney disease: Secondary | ICD-10-CM | POA: Diagnosis present

## 2023-04-15 DIAGNOSIS — R0789 Other chest pain: Secondary | ICD-10-CM | POA: Diagnosis not present

## 2023-04-15 DIAGNOSIS — E785 Hyperlipidemia, unspecified: Secondary | ICD-10-CM | POA: Insufficient documentation

## 2023-04-15 DIAGNOSIS — Z885 Allergy status to narcotic agent status: Secondary | ICD-10-CM | POA: Diagnosis not present

## 2023-04-15 DIAGNOSIS — N1831 Chronic kidney disease, stage 3a: Secondary | ICD-10-CM | POA: Insufficient documentation

## 2023-04-15 DIAGNOSIS — I10 Essential (primary) hypertension: Secondary | ICD-10-CM | POA: Diagnosis not present

## 2023-04-15 DIAGNOSIS — Z87891 Personal history of nicotine dependence: Secondary | ICD-10-CM | POA: Diagnosis not present

## 2023-04-15 DIAGNOSIS — I25110 Atherosclerotic heart disease of native coronary artery with unstable angina pectoris: Secondary | ICD-10-CM | POA: Diagnosis present

## 2023-04-15 LAB — CBC
HCT: 33.5 % — ABNORMAL LOW (ref 36.0–46.0)
Hemoglobin: 11 g/dL — ABNORMAL LOW (ref 12.0–15.0)
MCH: 30.4 pg (ref 26.0–34.0)
MCHC: 32.8 g/dL (ref 30.0–36.0)
MCV: 92.5 fL (ref 80.0–100.0)
Platelets: 324 10*3/uL (ref 150–400)
RBC: 3.62 MIL/uL — ABNORMAL LOW (ref 3.87–5.11)
RDW: 13.5 % (ref 11.5–15.5)
WBC: 14.1 10*3/uL — ABNORMAL HIGH (ref 4.0–10.5)
nRBC: 0 % (ref 0.0–0.2)

## 2023-04-15 LAB — URINALYSIS, W/ REFLEX TO CULTURE (INFECTION SUSPECTED)
Bacteria, UA: NONE SEEN
Bilirubin Urine: NEGATIVE
Glucose, UA: NEGATIVE mg/dL
Hgb urine dipstick: NEGATIVE
Ketones, ur: NEGATIVE mg/dL
Leukocytes,Ua: NEGATIVE
Nitrite: NEGATIVE
Protein, ur: NEGATIVE mg/dL
Specific Gravity, Urine: 1.015 (ref 1.005–1.030)
pH: 6 (ref 5.0–8.0)

## 2023-04-15 LAB — RESP PANEL BY RT-PCR (RSV, FLU A&B, COVID)  RVPGX2
Influenza A by PCR: NEGATIVE
Influenza B by PCR: NEGATIVE
Resp Syncytial Virus by PCR: NEGATIVE
SARS Coronavirus 2 by RT PCR: NEGATIVE

## 2023-04-15 LAB — BASIC METABOLIC PANEL
Anion gap: 13 (ref 5–15)
BUN: 16 mg/dL (ref 8–23)
CO2: 22 mmol/L (ref 22–32)
Calcium: 9.1 mg/dL (ref 8.9–10.3)
Chloride: 102 mmol/L (ref 98–111)
Creatinine, Ser: 1.01 mg/dL — ABNORMAL HIGH (ref 0.44–1.00)
GFR, Estimated: 58 mL/min — ABNORMAL LOW (ref >60)
Glucose, Bld: 189 mg/dL — ABNORMAL HIGH (ref 70–99)
Potassium: 4 mmol/L (ref 3.5–5.1)
Sodium: 137 mmol/L (ref 135–145)

## 2023-04-15 LAB — LIPASE, BLOOD: Lipase: 37 U/L (ref 11–51)

## 2023-04-15 LAB — LACTIC ACID, PLASMA
Lactic Acid, Venous: 3.2 mmol/L (ref 0.5–1.9)
Lactic Acid, Venous: 3.9 mmol/L (ref 0.5–1.9)
Lactic Acid, Venous: 2.4 mmol/L (ref 0.5–1.9)

## 2023-04-15 LAB — HEPATIC FUNCTION PANEL
ALT: 32 U/L (ref 0–44)
AST: 45 U/L — ABNORMAL HIGH (ref 15–41)
Albumin: 4 g/dL (ref 3.5–5.0)
Alkaline Phosphatase: 42 U/L (ref 38–126)
Bilirubin, Direct: 0.2 mg/dL (ref 0.0–0.2)
Indirect Bilirubin: 0.6 mg/dL (ref 0.3–0.9)
Total Bilirubin: 0.8 mg/dL (ref 0.0–1.2)
Total Protein: 6.4 g/dL — ABNORMAL LOW (ref 6.5–8.1)

## 2023-04-15 LAB — TROPONIN I (HIGH SENSITIVITY)
Troponin I (High Sensitivity): 9 ng/L (ref <18)
Troponin I (High Sensitivity): 7 ng/L (ref <18)

## 2023-04-15 LAB — CBG MONITORING, ED
Glucose-Capillary: 179 mg/dL — ABNORMAL HIGH (ref 70–99)
Glucose-Capillary: 128 mg/dL — ABNORMAL HIGH (ref 70–99)

## 2023-04-15 LAB — TSH: TSH: 0.268 u[IU]/mL — ABNORMAL LOW (ref 0.350–4.500)

## 2023-04-15 LAB — T4, FREE: Free T4: 1.45 ng/dL — ABNORMAL HIGH (ref 0.61–1.12)

## 2023-04-15 LAB — PROCALCITONIN: Procalcitonin: <0.1 ng/mL

## 2023-04-15 MED ORDER — LACTATED RINGERS IV SOLN: INTRAVENOUS | Status: AC

## 2023-04-15 MED ORDER — IOHEXOL 350 MG/ML SOLN
100.0000 mL | Freq: Once | INTRAVENOUS | Status: AC | PRN
  Administered 2023-04-15: 100 mL via INTRAVENOUS

## 2023-04-15 MED ORDER — ACETAMINOPHEN 650 MG RE SUPP: 650.0000 mg | Freq: Four times a day (QID) | RECTAL | Status: DC | PRN

## 2023-04-15 MED ORDER — SODIUM CHLORIDE 0.9 % IV BOLUS
500.0000 mL | Freq: Once | INTRAVENOUS | Status: AC
  Administered 2023-04-15: 500 mL via INTRAVENOUS

## 2023-04-15 MED ORDER — SODIUM CHLORIDE 0.9 % IV SOLN
2.0000 g | Freq: Once | INTRAVENOUS | Status: AC
  Administered 2023-04-15: 2 g via INTRAVENOUS
  Filled 2023-04-15: qty 12.5

## 2023-04-15 MED ORDER — ONDANSETRON HCL 4 MG/2ML IJ SOLN: 4.0000 mg | Freq: Four times a day (QID) | INTRAMUSCULAR | Status: DC | PRN

## 2023-04-15 MED ORDER — HYDRALAZINE HCL 20 MG/ML IJ SOLN: 5.0000 mg | Freq: Four times a day (QID) | INTRAMUSCULAR | Status: DC | PRN

## 2023-04-15 MED ORDER — IOHEXOL 300 MG/ML  SOLN
60.0000 mL | Freq: Once | INTRAMUSCULAR | Status: AC | PRN
  Administered 2023-04-15: 60 mL via INTRAVENOUS

## 2023-04-15 MED ORDER — NITROGLYCERIN 2 % TD OINT: 1.0000 [in_us] | TOPICAL_OINTMENT | Freq: Four times a day (QID) | TRANSDERMAL | Status: DC | PRN

## 2023-04-15 MED ORDER — ACETAMINOPHEN 325 MG PO TABS: 650.0000 mg | ORAL_TABLET | Freq: Four times a day (QID) | ORAL | Status: DC | PRN

## 2023-04-15 MED ORDER — METRONIDAZOLE 500 MG/100ML IV SOLN
500.0000 mg | Freq: Once | INTRAVENOUS | Status: AC
Start: 2023-04-15 — End: 2023-04-15
  Administered 2023-04-15: 500 mg via INTRAVENOUS
  Filled 2023-04-15: qty 100

## 2023-04-15 MED ORDER — SODIUM CHLORIDE 0.9 % IV BOLUS (SEPSIS)
1000.0000 mL | Freq: Once | INTRAVENOUS | Status: AC
Start: 2023-04-15 — End: 2023-04-15
  Administered 2023-04-15: 1000 mL via INTRAVENOUS

## 2023-04-15 MED ORDER — VANCOMYCIN HCL IN DEXTROSE 1-5 GM/200ML-% IV SOLN
1000.0000 mg | Freq: Once | INTRAVENOUS | Status: AC
Start: 2023-04-15 — End: 2023-04-15
  Administered 2023-04-15: 1000 mg via INTRAVENOUS
  Filled 2023-04-15: qty 200

## 2023-04-15 MED ORDER — SENNOSIDES-DOCUSATE SODIUM 8.6-50 MG PO TABS: 1.0000 | ORAL_TABLET | Freq: Every evening | ORAL | Status: DC | PRN

## 2023-04-15 MED ORDER — INSULIN ASPART 100 UNIT/ML IJ SOLN
0.0000 [IU] | Freq: Three times a day (TID) | INTRAMUSCULAR | Status: DC
  Administered 2023-04-15: 3 [IU] via SUBCUTANEOUS
  Administered 2023-04-16: 2 [IU] via SUBCUTANEOUS
  Administered 2023-04-16: 3 [IU] via SUBCUTANEOUS
  Administered 2023-04-16: 2 [IU] via SUBCUTANEOUS
  Administered 2023-04-17: 5 [IU] via SUBCUTANEOUS
  Administered 2023-04-17: 3 [IU] via SUBCUTANEOUS
  Filled 2023-04-15 (×6): qty 1

## 2023-04-15 MED ORDER — LACTATED RINGERS IV BOLUS
1000.0000 mL | Freq: Once | INTRAVENOUS | Status: AC
Start: 2023-04-15 — End: 2023-04-15
  Administered 2023-04-15: 1000 mL via INTRAVENOUS

## 2023-04-15 MED ORDER — LEVOTHYROXINE SODIUM 100 MCG PO TABS
125.0000 ug | ORAL_TABLET | Freq: Every day | ORAL | Status: DC
  Administered 2023-04-16 – 2023-04-17 (×2): 125 ug via ORAL
  Filled 2023-04-15 (×2): qty 1

## 2023-04-15 MED ORDER — ACETAMINOPHEN 500 MG PO TABS
1000.0000 mg | ORAL_TABLET | Freq: Once | ORAL | Status: AC
  Administered 2023-04-15: 1000 mg via ORAL
  Filled 2023-04-15: qty 2

## 2023-04-15 MED ORDER — INSULIN ASPART 100 UNIT/ML IJ SOLN: 0.0000 [IU] | Freq: Every day | INTRAMUSCULAR | Status: DC

## 2023-04-15 MED ORDER — ENOXAPARIN SODIUM 40 MG/0.4ML IJ SOSY
40.0000 mg | PREFILLED_SYRINGE | INTRAMUSCULAR | Status: DC
  Administered 2023-04-15 – 2023-04-16 (×2): 40 mg via SUBCUTANEOUS
  Filled 2023-04-15 (×2): qty 0.4

## 2023-04-15 MED ORDER — ONDANSETRON HCL 4 MG PO TABS: 4.0000 mg | ORAL_TABLET | Freq: Four times a day (QID) | ORAL | Status: DC | PRN

## 2023-04-15 NOTE — ED Provider Notes (Signed)
 [facility] Provider Note    Event Date/Time   First MD Initiated Contact with Patient 04/15/23 1030     (approximate)   History   Chest Pain   HPI  [name] is a 78 y.o. female past medical history significant for diabetes, hyperlipidemia, hypothyroidism, coronary artery disease with prior PCI, presents to the emergency department for not feeling well.  States that this morning she got up and she was having severe chest pain.  Feels like it is a radiating from her abdomen behind her heart and up to her neck and back.  Denies significant nausea or vomiting.  Mild shortness of breath.  States that she was in her normal state of health yesterday.  No significant abdominal pain but only hurts whenever she is moving in her chest and back.  Denies any diarrhea.  No wounds.  No rashes.  No new medications.     Physical Exam   Triage Vital Signs: ED Triage Vitals  Encounter Vitals Group     BP 04/15/23 1024 (!) 168/74     Systolic BP Percentile --      Diastolic BP Percentile --      Pulse Rate 04/15/23 1024 (!) 125     Resp 04/15/23 1024 19     Temp 04/15/23 1024 100 F (37.8 C)     Temp Source 04/15/23 1024 Oral     SpO2 04/15/23 1024 96 %     Weight 04/15/23 1021 136 lb (61.7 kg)     Height 04/15/23 1021 5' 2 (1.575 m)     Head Circumference --      Peak Flow --      Pain Score 04/15/23 1020 5     Pain Loc --      Pain Education --      Exclude from Growth Chart --     Most recent vital signs: Vitals:   04/15/23 1355 04/15/23 1400  BP:  135/64  Pulse: (!) 101 (!) 101  Resp: 15 14  Temp:    SpO2: 97% 96%    Physical Exam Constitutional:      Appearance: She is well-developed.  HENT:     Head: Atraumatic.  Eyes:     Conjunctiva/sclera: Conjunctivae normal.  Cardiovascular:     Rate and Rhythm: Regular rhythm. Tachycardia present.     Heart sounds: Normal heart sounds. No murmur heard. Pulmonary:     Effort: Tachypnea  present. No respiratory distress.  Abdominal:     General: There is no distension.     Tenderness: There is abdominal tenderness (Mild tenderness to palpation to the right upper abdomen).  Musculoskeletal:        General: Normal range of motion.     Cervical back: Normal range of motion.  Skin:    General: Skin is warm.     Capillary Refill: Capillary refill takes less than 2 seconds.  Neurological:     General: No focal deficit present.     Mental Status: She is alert. Mental status is at baseline.     IMPRESSION / MDM / ASSESSMENT AND PLAN / ED COURSE  I reviewed the triage vital signs and the nursing notes.  Differential diagnosis including dissection, pneumonia, pulmonary embolism, viral illness including COVID/influenza, urinary tract infection, acute cholecystitis  EKG  I, Clotilda Punter, the attending physician, personally viewed and interpreted this ECG.  Sinus tachycardia with a heart rate of 127.  Nonspecific ST changes.  No  significant ST elevation or depression.  No findings of acute ischemia.  Sinus tachycardia while on cardiac telemetry.  RADIOLOGY I independently reviewed imaging, my interpretation of imaging: CTA of the chest with no signs of dissection or pneumonia.  Read as no acute findings.  LABS (all labs ordered are listed, but only abnormal results are displayed) Labs interpreted as -    Labs Reviewed  BASIC METABOLIC PANEL - Abnormal; Notable for the following components:      Result Value   Glucose, Bld 189 (*)    Creatinine, Ser 1.01 (*)    GFR, Estimated 58 (*)    All other components within normal limits  CBC - Abnormal; Notable for the following components:   WBC 14.1 (*)    RBC 3.62 (*)    Hemoglobin 11.0 (*)    HCT 33.5 (*)    All other components within normal limits  URINALYSIS, W/ REFLEX TO CULTURE (INFECTION SUSPECTED) - Abnormal; Notable for the following components:   Color, Urine YELLOW (*)    APPearance CLEAR (*)    All other  components within normal limits  HEPATIC FUNCTION PANEL - Abnormal; Notable for the following components:   Total Protein 6.4 (*)    AST 45 (*)    All other components within normal limits  LACTIC ACID, PLASMA - Abnormal; Notable for the following components:   Lactic Acid, Venous 3.2 (*)    All other components within normal limits  LACTIC ACID, PLASMA - Abnormal; Notable for the following components:   Lactic Acid, Venous 3.9 (*)    All other components within normal limits  RESP PANEL BY RT-PCR (RSV, FLU A&B, COVID)  [id number]  CULTURE, BLOOD (SINGLE)  CULTURE, BLOOD (SINGLE)  LIPASE, BLOOD  LACTIC ACID, PLASMA  LACTIC ACID, PLASMA  TSH  T4, FREE  TROPONIN I (HIGH SENSITIVITY)  TROPONIN I (HIGH SENSITIVITY)     MDM  On arrival patient with severe chest pain and tachycardia, concern for possible dissection.  Patient then became febrile and had a fever of 101.6.  Added on COVID studies and a urine.  Met sepsis criteria so started on broad-spectrum antibiotics.  Blood cultures obtained.  Felt that 30 cc/kg of IV fluids may be detrimental to the patient given her good perfusion, will give a 500 bolus and reevaluate.  On reevaluation continued to be tachycardic so given another 2 L of IV fluids to complete 2500.  No obvious etiology for her sepsis.  Initial lactic acid elevated at 3.2 and uptrending to 3.9.  Abdomen continues to be nontender to palpation.  Sepsis of unknown source.  Consulted and discussed the patient's case with hospitalist and patient admitted.     PROCEDURES:  Critical Care performed: yes  .Critical Care  Performed by: Suzanne Kirsch, MD Authorized by: Suzanne Kirsch, MD   Critical care provider statement:    Critical care time (minutes):  45   Critical care time was exclusive of:  Separately billable procedures and treating other patients   Critical care was necessary to treat or prevent imminent or life-threatening deterioration of the following  conditions:  Sepsis   Critical care was time spent personally by me on the following activities:  Development of treatment plan with patient or surrogate, discussions with consultants, evaluation of patient's response to treatment, examination of patient, ordering and review of laboratory studies, ordering and review of radiographic studies, ordering and performing treatments and interventions, pulse oximetry, re-evaluation of patient's condition and review of old charts  Care discussed with: admitting provider     Patient's presentation is most consistent with acute presentation with potential threat to life or bodily function.   MEDICATIONS ORDERED IN ED: Medications  lactated ringers  infusion (has no administration in time range)  vancomycin  (VANCOCIN ) IVPB 1000 mg/200 mL premix (has no administration in time range)  lactated ringers  bolus 1,000 mL (has no administration in time range)  levothyroxine  (SYNTHROID ) tablet 125 mcg (has no administration in time range)  acetaminophen  (TYLENOL ) tablet 650 mg (has no administration in time range)    Or  acetaminophen  (TYLENOL ) suppository 650 mg (has no administration in time range)  ondansetron  (ZOFRAN ) tablet 4 mg (has no administration in time range)    Or  ondansetron  (ZOFRAN ) injection 4 mg (has no administration in time range)  enoxaparin  (LOVENOX ) injection 40 mg (has no administration in time range)  senna-docusate (Senokot-S) tablet 1 tablet (has no administration in time range)  acetaminophen  (TYLENOL ) tablet 1,000 mg (1,000 mg Oral Given 04/15/23 1116)  sodium chloride  0.9 % bolus 500 mL (0 mLs Intravenous Stopped 04/15/23 1300)  iohexol  (OMNIPAQUE ) 350 MG/ML injection 100 mL (100 mLs Intravenous Contrast Given 04/15/23 1139)  sodium chloride  0.9 % bolus 1,000 mL (1,000 mLs Intravenous New Bag/Given 04/15/23 1344)  ceFEPIme  (MAXIPIME ) 2 g in sodium chloride  0.9 % 100 mL IVPB (0 g Intravenous Stopped 04/15/23 1429)  metroNIDAZOLE  (FLAGYL )  IVPB 500 mg (500 mg Intravenous New Bag/Given 04/15/23 1429)    FINAL CLINICAL IMPRESSION(S) / ED DIAGNOSES   Final diagnoses:  Sepsis, due to unspecified organism, unspecified whether acute organ dysfunction present [facility])     Rx / DC Orders   ED Discharge Orders     None        Note:  This document was prepared using Dragon voice recognition software and may include unintentional dictation errors.   Suzanne Kirsch, MD 04/15/23 1540

## 2023-04-15 NOTE — Sepsis Progress Note (Signed)
 Elink following code sepsis

## 2023-04-15 NOTE — ED Triage Notes (Addendum)
 Pt in via POV, reports centralized chest pain with radiation straight through to back.  Also endorses some shortness of breath; reports pain started yesterday and has worsened.  Patient appears uncomfortable in triage.    Hx of stent placement 2022.

## 2023-04-15 NOTE — Consult Note (Signed)
 CARDIOLOGY CONSULT NOTE               Patient ID: [name] MRN: [id number] DOB/AGE: [birth year] 78 y.o.  Admit date: 04/15/2023 Referring Physician Dr. Greig Free hospitalist Primary Physician Dr. Sadie primary Primary Cardiologist [facility] Reason for Consultation chest pain known coronary artery disease  HPI: Patient is a 78 year old female known coronary disease previous PCI and stent RCA in 2022 patient recently described diabetes hyperlipidemia hypertension thyroid disease came to the ER with concerns for chest pain that radiated to her back with shortness of breath which started yesterday patient had been in her normal state of health but in the ER had a temperature of 101 with tachycardia and an elevated lactic acid.  Troponins have been relatively flat but she states she has had chest pain with any exertion and she feels weak and washed out she is also had some diarrhea at home no nausea vomiting.  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, right eyebrow   Diabetes mellitus without complication (HCC)    Family history of adverse reaction to anesthesia    Son - slow to wake   Hypercholesteremia    Hypothyroidism    Motion sickness    car [place]    Past Surgical History:  Procedure Laterality Date   ABDOMINAL HYSTERECTOMY     CATARACT EXTRACTION W/PHACO Left 11/04/2016   Procedure: CATARACT EXTRACTION PHACO AND INTRAOCULAR LENS PLACEMENT (IOC);  Surgeon: Jaye Fallow, MD;  Location: [facility] ORS;  Service: Ophthalmology;  Laterality: Left;  US  00:24.5 AP% 13.5 CDE 3.30 Fluid Pack lot #  [id number]   CATARACT EXTRACTION W/PHACO Right 11/25/2016   Procedure: CATARACT EXTRACTION PHACO AND INTRAOCULAR LENS PLACEMENT (IOC);  Surgeon: Jaye Fallow, MD;  Location: [facility] ORS;  Service: Ophthalmology;  Laterality: Right;  US  00:32 AP% 15.7 CDE 5.00 Fluid pack lot # [id number]   CORONARY STENT INTERVENTION N/A 01/30/2021   Procedure: CORONARY STENT INTERVENTION;  Surgeon: Lawyer Bernardino Cough, MD;  Location: [facility] INVASIVE CV LAB;  Service: Cardiovascular;  Laterality: N/A;   CTR     release of tendon only   IMAGE GUIDED SINUS SURGERY Bilateral 06/17/2022   Procedure: IMAGE GUIDED SINUS SURGERY;  Surgeon: Blair Mt, MD;  Location: [facility];  Service: ENT;  Laterality: Bilateral;   LEFT HEART CATH AND CORONARY ANGIOGRAPHY N/A 01/30/2021   Procedure: LEFT HEART CATH AND CORONARY ANGIOGRAPHY;  Surgeon: Lawyer Bernardino Cough, MD;  Location: [facility] INVASIVE CV LAB;  Service: Cardiovascular;  Laterality: N/A;   MAXILLARY ANTROSTOMY Bilateral 06/17/2022   Procedure: MAXILLARY ANTROSTOMY WITH TISSUE REMOVAL;  Surgeon: Blair Mt, MD;  Location: [facility];  Service: ENT;  Laterality: Bilateral;   POLYPECTOMY Bilateral 06/17/2022   Procedure: POLYPECTOMY NASAL;  Surgeon: Blair Mt, MD;  Location: [facility];  Service: ENT;  Laterality: Bilateral;  Diabetic    (Not in a hospital admission)  Social History   Socioeconomic History   Marital status: Married    Spouse name: Not on file   Number of children: Not  on file   Years of education: Not on file   Highest education level: Not on file  Occupational History   Not on file  Tobacco Use   Smoking status: Former    Current packs/day: 0.00    Average packs/day: 0.5 packs/day for 10.0 years (5.0 ttl pk-yrs)    Types: Cigarettes    Start date: 04/14/1988    Quit date: 04/14/1998    Years since quitting: 25.0   Smokeless tobacco: Never  Vaping Use   Vaping status: Never Used  Substance and Sexual Activity    Alcohol use: No   Drug use: Not Currently   Sexual activity: Never  Other Topics Concern   Not on file  Social History Narrative   Not on file   Social Drivers of Health   Financial Resource Strain: Low Risk  (09/09/2022)   Received from [facility], [facility]   Overall Financial Resource Strain (CARDIA)    Difficulty of Paying Living Expenses: Not hard at all  Food Insecurity: No Food Insecurity (09/09/2022)   Received from [facility], [facility]   Hunger Vital Sign    Worried About Running Out of Food in the Last Year: Never true    Ran Out of Food in the Last Year: Never true  Transportation Needs: No Transportation Needs (09/09/2022)   Received from [facility], [facility]   [facility] - Transportation    In the past 12 months, has lack of transportation kept you from medical appointments or from getting medications?: No    Lack of Transportation (Non-Medical): No  Physical Activity: Not on file  Stress: Not on file  Social Connections: Not on file  Intimate Partner Violence: Not on file    Family History  Problem Relation Age of Onset   Cancer Mother 55       cervical   Breast cancer Neg Hx       Review of systems complete and found to be negative unless listed above      PHYSICAL EXAM  General: Well developed, well nourished, in no acute distress HEENT:  Normocephalic and atramatic Neck:  No JVD.  Lungs: Clear bilaterally to auscultation and percussion. Heart: Tachycardia. Normal S1 and S2 without gallops or murmurs.  Abdomen: Bowel sounds are positive, abdomen soft and non-tender  Msk:  Back normal, normal gait. Normal strength and tone for age. Extremities: No clubbing, cyanosis or edema.   Neuro: Alert and oriented X 3. Psych:  Good affect, responds appropriately  Labs:   Lab Results  Component Value Date   WBC 14.1 (H) 04/15/2023   HGB 11.0  (L) 04/15/2023   HCT 33.5 (L) 04/15/2023   MCV 92.5 04/15/2023   PLT 324 04/15/2023    Recent Labs  Lab 04/15/23 1035  NA 137  K 4.0  CL 102  CO2 22  BUN 16  CREATININE 1.01*  CALCIUM  9.1  PROT 6.4*  BILITOT 0.8  ALKPHOS 42  ALT 32  AST 45*  GLUCOSE 189*   Lab Results  Component Value Date   TROPONINI < 0.02 08/22/2013   No results found for: CHOL No results found for: HDL No results found for: LDLCALC No results found for: TRIG No results found for: CHOLHDL No results found for: LDLDIRECT    Radiology: CT ABDOMEN PELVIS W CONTRAST Result Date: 04/15/2023 CLINICAL DATA:  Sepsis Abdominal pain, acute, nonlocalized. Chest pain radiating to the back.  Dyspnea. EXAM: CT ABDOMEN AND PELVIS WITH CONTRAST TECHNIQUE: Multidetector CT imaging of the abdomen and pelvis was performed using the standard protocol following bolus administration of intravenous contrast. RADIATION DOSE REDUCTION: This exam was performed according to the departmental dose-optimization program which includes automated exposure control, adjustment of the mA and/or kV according to patient size and/or use of iterative reconstruction technique. CONTRAST:  60mL OMNIPAQUE  IOHEXOL  300 MG/ML  SOLN COMPARISON:  Right upper quadrant abdominal sonogram and chest CT angiogram from earlier today. FINDINGS: Lower chest: No significant pulmonary nodules or acute consolidative airspace disease. Coronary atherosclerosis. Hepatobiliary: Normal liver size. No liver mass. Normal gallbladder with no radiopaque cholelithiasis. No biliary ductal dilatation. CBD diameter 5 mm. Pancreas: Normal, with no mass or duct dilation. Spleen: Normal size. No mass. Adrenals/Urinary Tract: Normal adrenals. No hydronephrosis. Small parapelvic simple left renal cysts, for which no follow-up imaging is recommended. No suspicious renal masses. Excreted contrast noted within bilateral urinary tracts in bladder. Normal bladder. Stomach/Bowel: Small  hiatal hernia. Otherwise normal stomach. Normal caliber small bowel with no small bowel wall thickening. Appendix not discretely visualized. No pericecal inflammatory changes. Moderate diffuse colorectal stool volume. No large bowel wall thickening, diverticulosis or significant pericolonic fat stranding. Vascular/Lymphatic: Atherosclerotic nonaneurysmal abdominal aorta. Patent portal, splenic, hepatic and renal veins. No pathologically enlarged lymph nodes in the abdomen or pelvis. Reproductive: Status post hysterectomy, with no abnormal findings at the vaginal cuff. No adnexal mass. Other: No pneumoperitoneum, ascites or focal fluid collection. Musculoskeletal: No aggressive appearing focal osseous lesions. Mild lumbar spondylosis. IMPRESSION: 1. No acute abnormality. No evidence of bowel obstruction or acute bowel inflammation. 2. Moderate diffuse colorectal stool volume, suggesting constipation. 3. Small hiatal hernia. 4. Coronary atherosclerosis. 5.  Aortic Atherosclerosis (ICD10-I70.0). Electronically Signed   By: Selinda DELENA Blue M.D.   On: 04/15/2023 16:48   US  ABDOMEN LIMITED RUQ (LIVER/GB) Result Date: 04/15/2023 CLINICAL DATA:  Right upper quadrant pain as well as chest pain radiating to back since yesterday. EXAM: ULTRASOUND ABDOMEN LIMITED RIGHT UPPER QUADRANT COMPARISON:  None Available. FINDINGS: Gallbladder: No gallstones or wall thickening visualized. No sonographic Murphy sign noted by sonographer. Common bile duct: Diameter: 7.8 mm which is at the upper limits of normal in diameter. Liver: No focal lesion identified. Within normal limits in parenchymal echogenicity. Portal vein is patent on color Doppler imaging with normal direction of blood flow towards the liver. Other: None. IMPRESSION: Common bile duct at the upper limits of normal in diameter. Recommend correlation with LFTs. Otherwise negative right upper quadrant ultrasound. Electronically Signed   By: Toribio Agreste M.D.   On: 04/15/2023  14:22   CT Angio Chest Aorta W and/or Wo Contrast Result Date: 04/15/2023 CLINICAL DATA:  Acute aortic syndrome Centralized chest pain radiating through to back EXAM: CT ANGIOGRAPHY CHEST WITH CONTRAST TECHNIQUE: Multidetector CT imaging of the chest was performed using the standard protocol during bolus administration of intravenous contrast. Multiplanar CT image reconstructions and MIPs were obtained to evaluate the vascular anatomy. RADIATION DOSE REDUCTION: This exam was performed according to the departmental dose-optimization program which includes automated exposure control, adjustment of the mA and/or kV according to patient size and/or use of iterative reconstruction technique. CONTRAST:  OMNIPAQUE  IOHEXOL  350 MG/ML SOLN COMPARISON:  None available. FINDINGS: Cardiovascular: Heart size is normal. Coronary calcifications are present. No pulmonary artery embolism. No aortic dissection. Mild scattered calcified atheromatous plaque seen throughout the thoracic aorta. Mediastinum/Nodes: No enlarged mediastinal, hilar, or axillary lymph nodes. Thyroid gland, trachea, and esophagus  demonstrate no significant findings. Lungs/Pleura: Lungs are clear. No pleural effusion or pneumothorax. Upper Abdomen: Small hiatal hernia is present. Visualized upper abdominal organs otherwise unremarkable. Musculoskeletal: No chest wall abnormality. No acute or significant osseous findings. Review of the MIP images confirms the above findings. IMPRESSION: No acute abnormality of the chest. No evidence of aortic dissection or pulmonary artery embolism. Electronically Signed   By: Aliene Lloyd M.D.   On: 04/15/2023 12:11   DG Chest 2 View Result Date: 04/15/2023 CLINICAL DATA:  Chest pain radiating through the back. EXAM: CHEST - 2 VIEW COMPARISON:  Chest radiograph dated 01/09/2021. FINDINGS: The heart size and mediastinal contours are within normal limits. Vascular calcifications are seen in the aortic arch. Both lungs are  clear. Degenerative changes are seen in the spine. IMPRESSION: No active cardiopulmonary disease. Electronically Signed   By: Norman Hopper M.D.   On: 04/15/2023 11:27    EKG: Sinus tach rate of 120 right bundle branch block  ASSESSMENT AND PLAN:  Chest pain Active Problems: Known coronary artery disease History of PCI and stent to RCA 2022   Unstable angina (HCC)   Leukocytosis   Diabetes mellitus type 2, noninsulin dependent (HCC)   Essential hypertension   Hyperlipidemia   CKD stage 3a, GFR 45-59 ml/min (HCC)   Hypothyroidism   Diarrhea . Plan Agree with admit for chest pain follow-up EKGs and troponin as well as EKGs Leukocytosis and lactic acidosis concerning for possible source of infection Agree with broad-spectrum antibiotic therapy until further workup complete Diarrhea unclear etiology evaluating for C. Difficile Chronic renal sufficiency stage IIIa continue adequate hydration Hypertension reasonably controlled currently on hydralazine  as needed IV Diabetes type 2 uncomplicated continue current management with regular insulin  as needed patient was on Glucotrol metformin which should be held because of her lactic acidosis Hyperlipidemia recommend continue Lipitor  therapy for lipid management Will consider functional study versus cath based on how the patient progresses Recommend evaluation for noncardiac causes as well as source of infection with lactic acidosis and elevated white count We will continue to follow the patient  Signed: Cara JONETTA Lovelace MD 04/15/2023, 6:47 PM

## 2023-04-15 NOTE — Progress Notes (Signed)
 CODE SEPSIS - PHARMACY COMMUNICATION  **Broad Spectrum Antibiotics should be administered within 1 hour of Sepsis diagnosis**  Time Code Sepsis Called/Page Received: 1239  Antibiotics Ordered: Vancomycin  and cefepime   Time of 1st antibiotic administration: 1335  Additional action taken by pharmacy: Messaged RN  If necessary, Name of Provider/Nurse Contacted: None    Damien Napoleon ,PharmD Clinical Pharmacist  04/15/2023  1:36 PM

## 2023-04-15 NOTE — ED Notes (Signed)
 Blood cultures obtained prior to starting first antibiotic but sent to lab after antibiotic administration

## 2023-04-15 NOTE — ED Notes (Signed)
 Patient transported to CT

## 2023-04-15 NOTE — ED Notes (Signed)
Patient eating dinner tray at this time. 

## 2023-04-15 NOTE — Hospital Course (Addendum)
 Hospital course / significant events:   HPI: [name] is a 78 year old female with history of chronic sinusitis, non-insulin -dependent diabetes mellitus, hyperlipidemia, hypertension, hypothyroid, who presents emergency department for chief concerns of chest pain with radiation to her back and shortness of breath that started on 04/14/2023.  ED w/u nothing significant, admitted to hospitalist for chest pain r/o ACS. Echo pending. Cardio consult - advised noncardiac. Echo ordered 1/1 has been pending, done and read 01/03 E 70-75, G2DD (+)SIRS w/o infectious source found, afebrile off abx, suspect viral source which is self-resolving     Consultants:  Cardiology   Procedures/Surgeries: none      ASSESSMENT & PLAN:   Chest pain Chest pain with some typical features. Troponin remain flat. Echocardiogram no concerns, follow w/ PCP. Not taking aspirin  due to allergy to NSAIDs. Check A1c, lipid profile. Cardiology consult appreciated, advised to evaluate for noncardiac causes.   SIRS question mild sepsis d/t viral illness nonspecific  Fever, leukocytosis, elevated lactic acid, She is noted having diarrhea.  Possible viral illness.  Patient got IV Vanco, cefepime  and Flagyl  in the ED. Blood cultures so far negative. Pro-Cal negative.  GI panel negative. CTA of the chest was negative for PE or infectious etiology and dissection CTA abdomen pelvis with contrast was negative for CT evidence of infection Will hold antibiotics  Continue to monitor for persistent fever.   Hypothyroidism Resume Home levothyroxine  125 mcg daily resumed   CKD stage 3a, GFR 45-59 ml/min (HCC) Creatinine baseline.  Avoid nephrotoxic drugs.   Essential hypertension Hydrochlorothiazide at home every other day  Follow outpatient    Diabetes mellitus type 2, noninsulin dependent (HCC) Insulin  SSI with at bedtime coverage ordered Goal inpatient blood glucose levels 140-180

## 2023-04-15 NOTE — Assessment & Plan Note (Signed)
 Check C. difficile PCR and GI panel No antidiarrheal medication until the above has resulted as negative

## 2023-04-15 NOTE — ED Notes (Signed)
Patient's husband at bedside

## 2023-04-15 NOTE — ED Notes (Signed)
Dr. Cox at bedside.  

## 2023-04-15 NOTE — Assessment & Plan Note (Signed)
 Home levothyroxine 125 mcg daily resumed

## 2023-04-15 NOTE — Assessment & Plan Note (Signed)
 Hydralazine 5 mg IV every 6 hours as needed for SBP > 170, 5 days ordered

## 2023-04-15 NOTE — Assessment & Plan Note (Addendum)
 Etiology workup in progress, differentials include severe sepsis versus reactive in setting of atypical chest pain concerning for unstable angina Blood cultures x 2 are in process, check procalcitonin on admission Patient is status post vancomycin  and cefepime  and metronidazole  one-time doses per EDP On admission, I do not see a source of infection at this time CTA of the chest was negative for PE or infectious etiology and dissection CTA abdomen pelvis with contrast was negative for CT evidence of infection Antibiotic ordered on admission AM team to treat with antibiotic as appropriate Treat per above, check CBC in a.m.

## 2023-04-15 NOTE — H&P (Signed)
 History and Physical   [name] FMW:[id number] DOB: Jul 19, 1945 DOA: 04/15/2023  PCP: [name] Manna, MD  Outpatient Specialists: Dr. Florencio [facility] Patient coming from: Home via POV  I have personally briefly reviewed patient's old medical records in [facility] EMR.  Chief Concern: Chest pain, shortness of breath  HPI: [name] is a 78 year old female with history of chronic sinusitis, non-insulin -dependent diabetes mellitus, hyperlipidemia, hypertension, hypothyroid, who presents emergency department for chief concerns of chest pain with radiation to her back and shortness of breath that started on 04/14/2023.  Vitals in the ED showed temperature of Tmax of 101.1, improved to 100.6, respiration rate of 15, heart rate initially 110, blood pressure 132/59, SpO2 of 99% on room air.  Serum sodium is 137, potassium 4.0, chloride 102, bicarb 22, BUN of 16, serum creatinine 1.01, EGFR 58, nonfasting blood glucose 189, WBC 14.1, hemoglobin 11, platelets of 324.  High sensitive troponin is 9 and repeat is 7.  UA was negative for leukocytes and nitrates.  Lactic acid was 3.2 and on repeat was 3.9.  COVID/influenza A/influenza B/RSV PCR were negative.  ED treatment: Acetaminophen  1000 mg p.o. one-time dose, cefepime  per pharmacy, vancomycin  per pharmacy, LR 1 L bolus, sodium chloride  1 L bolus, sodium chloride  500 mL bolus, followed by LR infusion at 150 mL/h. ---------------------------- At bedside, patient able to tell me her name, her age, location, current calendar year.  She reports that last evening, she was clearing out her Christmas decorations when she developed chest pain.  She describes the pain as sharp and radiates to her back.  She developed shortness of breath today.  She reports both the shortness of breath and the chest tenderness resolved with rest.  At bedside, she denies current chest pain or shortness of breath at this time.  She denies  trauma to her person.  She denies dysuria, hematuria, blood in her stool, fever, chills, cough, nausea, vomiting.  She reports 6-7 episodes of watery bowel movements today prior to ED presentation.  She denies recent antibiotic use or known sick contacts or changes to her diet.  Social history: She is at home with her husband.  She denies tobacco, EtOH, recreational drug use.  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  ROS: Constitutional: no weight change, no fever ENT/Mouth: no sore throat, no rhinorrhea Eyes: no eye pain, no vision changes Cardiovascular: + chest pain, + dyspnea,  no edema, no palpitations Respiratory: no cough, no sputum, no wheezing Gastrointestinal: no nausea, no vomiting, no diarrhea, no constipation Genitourinary: no urinary incontinence, no dysuria, no hematuria Musculoskeletal: no arthralgias, no myalgias Skin: no skin lesions, no pruritus, Neuro: + weakness, no loss of consciousness, no syncope Psych: no anxiety, no depression, no decrease appetite Heme/Lymph: no bruising, no bleeding  ED Course: Discussed with the EDP, patient requiring hospitalization for chief concerns of severe sepsis, source unclear at this time.  Assessment/Plan  Principal Problem:   Chest pain Active Problems:   Unstable angina (HCC)   Leukocytosis   Diabetes mellitus type 2, noninsulin dependent (HCC)   Essential hypertension   Hyperlipidemia   CKD stage 3a, GFR 45-59 ml/min (HCC)   Hypothyroidism   Diarrhea   Assessment and Plan:  * Chest pain Atypical chest pain, concerning for unstable angina High sensitive troponin were negative x 2 Symptoms are concerning as they are relieved with rest Left heart cath in 01/30/2021: Post IVUS guided PCI  placement to proximal RCA.  Of note proximal and mid LAD had 50% stenosis and lateral first diagonal lesion had 50% stenosis. Complete echo on 01/14/2021: Was read as normal  left ventricular systolic function with mild LVH, estimated LV ejection fraction is greater than 55%. Cardiology has been consulted, Dr. Florencio has been consulted for consideration of inpatient stress testing Nitroglycerin  1 inch every 6 hours as needed for chest pain ordered  Leukocytosis Etiology workup in progress, differentials include severe sepsis versus reactive in setting of atypical chest pain concerning for unstable angina Blood cultures x 2 are in process, check procalcitonin on admission Patient is status post vancomycin  and cefepime  and metronidazole  one-time doses per EDP On admission, I do not see a source of infection at this time CTA of the chest was negative for PE or infectious etiology and dissection CTA abdomen pelvis with contrast was negative for CT evidence of infection Antibiotic ordered on admission AM team to treat with antibiotic as appropriate Treat per above, check CBC in a.m.  Unstable angina [facility]) Cardiology has been consulted  Diarrhea Check C. 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, right eyebrow   Diabetes mellitus without complication (HCC)    Family history of adverse reaction to anesthesia    Son - slow to wake   Hypercholesteremia    Hypothyroidism    Motion sickness    car [place]vy [place]   Past Surgical History:  Procedure Laterality Date   ABDOMINAL HYSTERECTOMY     CATARACT EXTRACTION W/PHACO Left 11/04/2016   Procedure: CATARACT EXTRACTION PHACO AND INTRAOCULAR LENS PLACEMENT (IOC);  Surgeon: Jaye Fallow, MD;  Location: [facility] ORS;  Service: Ophthalmology;  Laterality: Left;  US  00:24.5 AP% 13.5 CDE 3.30 Fluid Pack lot # [id number]   CATARACT EXTRACTION W/PHACO Right 11/25/2016   Procedure: CATARACT EXTRACTION PHACO AND INTRAOCULAR LENS PLACEMENT (IOC);  Surgeon: Jaye Fallow, MD;  Location: [facility] ORS;  Service: Ophthalmology;  Laterality: Right;  US  00:32 AP% 15.7 CDE 5.00 Fluid pack lot # [id number]   CORONARY STENT INTERVENTION N/A 01/30/2021   Procedure: CORONARY STENT INTERVENTION;  Surgeon: Lawyer Bernardino Cough, MD;  Location: [facility] INVASIVE CV LAB;  Service: Cardiovascular;  Laterality: N/A;   CTR     release of tendon only   IMAGE GUIDED SINUS SURGERY Bilateral 06/17/2022   Procedure: IMAGE GUIDED SINUS SURGERY;  Surgeon: Blair Mt, MD;  Location: [facility];  Service: ENT;  Laterality: Bilateral;   LEFT HEART CATH AND CORONARY ANGIOGRAPHY N/A 01/30/2021   Procedure: LEFT HEART CATH AND CORONARY ANGIOGRAPHY;  Surgeon: Lawyer Bernardino Cough, MD;  Location: [facility] INVASIVE CV LAB;  Service: Cardiovascular;  Laterality: N/A;   MAXILLARY  ANTROSTOMY Bilateral 06/17/2022   Procedure: MAXILLARY ANTROSTOMY WITH TISSUE REMOVAL;  Surgeon: Blair Mt, MD;  Location: [facility];  Service: ENT;  Laterality: Bilateral;   POLYPECTOMY Bilateral 06/17/2022   Procedure: POLYPECTOMY NASAL;  Surgeon: Blair Mt, MD;  Location: [facility];  Service: ENT;  Laterality: Bilateral;  Diabetic   Social History:  reports that  she quit smoking about 25 years ago. Her smoking use included cigarettes. She started smoking about 35 years ago. She has a 5 pack-year smoking history. She has never used smokeless tobacco. She reports that she does not currently use drugs. She reports that she does not drink alcohol.  Allergies  Allergen Reactions   Ibuprofen Shortness Of Breath    Other reaction(s): Other (See Comments) feels like an elephant sitting on chest   Propoxyphene Nausea And Vomiting   Tramadol Nausea And Vomiting   Ciprofloxacin Hives and Itching   Hydrocodone  Nausea And Vomiting   Oxycodone  Nausea And Vomiting   Pseudoephedrine Other (See Comments)    Insomnia   Family History  Problem Relation Age of Onset   Cancer Mother 45       cervical   Breast cancer Neg Hx    Family history: Family history reviewed and not pertinent.  Prior to Admission medications   Medication Sig Start Date End Date Taking? Authorizing Provider  acetaminophen  (TYLENOL ) 500 MG tablet Take 500 mg by mouth 2 (two) times daily as needed for mild pain.   Yes [provider]  atorvastatin  (LIPITOR ) 80 MG tablet Take 1 tablet (80 mg total) by mouth daily. 01/30/21 04/15/23 Yes Lawyer Bernardino Cough, MD  Calcium  Carbonate-Vitamin D (CALCIUM  600+D PO) Take 1 tablet by mouth 2 (two) times daily with a meal.   Yes [provider]  fenofibrate (TRICOR) 48 MG tablet Take 48 mg by mouth daily with lunch. 10/04/20  Yes [provider]  ferrous sulfate  325 (65 FE) MG tablet Take 325 mg by mouth daily with supper.   Yes [provider]  fluticasone  (FLONASE ) 50 MCG/ACT nasal spray Place 2 sprays into both nostrils daily.   Yes [provider]  glipiZIDE (GLUCOTROL) 10 MG tablet Take 10 mg by mouth 2 (two) times daily before a meal.   Yes [provider]  hydrochlorothiazide (HYDRODIURIL) 12.5 MG tablet Take 12.5 mg by mouth every other day.   Yes [provider]  levothyroxine   (SYNTHROID , LEVOTHROID) 125 MCG tablet Take 125 mcg by mouth daily before breakfast. Monday through Saturday   Yes [provider]  metFORMIN (GLUCOPHAGE) 1000 MG tablet Take 1,000 mg by mouth 2 (two) times daily.   Yes [provider]  Multiple Vitamin (MULTIVITAMIN WITH MINERALS) TABS tablet Take 1 tablet by mouth daily with supper.   Yes [provider]  Multiple Vitamins-Minerals (ICAPS PO) Take 1 tablet by mouth daily with breakfast.   Yes [provider]  Omega-3 Fatty Acids (FISH OIL) 1000 MG CAPS Take 1,000 mg by mouth daily with breakfast.   Yes [provider]  vitamin B-12 (CYANOCOBALAMIN ) 500 MCG tablet Take 500 mcg by mouth daily with breakfast.   Yes [provider]  vitamin C  (ASCORBIC ACID ) 500 MG tablet Take 500 mg by mouth daily with lunch.   Yes [provider]  clotrimazole-betamethasone (LOTRISONE) cream Apply 1 application topically daily as needed for rash. 10/06/16   [provider]  doxycycline  (VIBRAMYCIN ) 100 MG capsule 100 mg PO BID x 10 days Patient not taking:  Reported on 04/15/2023 06/17/22   Blair Mt, MD  HYDROcodone -acetaminophen  (NORCO/VICODIN) 5-325 MG tablet Take 1-2 tablets by mouth every 6 (six) hours as needed for moderate pain. Patient not taking: Reported on 04/15/2023 06/17/22   Blair Mt, MD  metFORMIN (GLUCOPHAGE) 500 MG tablet Take 500 mg by mouth 2 (two) times daily with a meal. Patient not taking: Reported on 04/15/2023 10/08/20   [provider]  ondansetron  (ZOFRAN -ODT) 4 MG disintegrating tablet Take 1 tablet (4 mg total) by mouth every 8 (eight) hours as needed for nausea or vomiting. Patient not taking: Reported on 04/15/2023 06/17/22   Blair Mt, MD  Tetrahydrozoline HCl (VISINE OP) Place 1 drop into both eyes daily as needed (eye irritation). Patient not taking: Reported on 06/17/2022    [provider]   Physical Exam: Vitals:   04/15/23 1200 04/15/23 1350  04/15/23 1355 04/15/23 1400  BP:  (!) 105/54  135/64  Pulse:   (!) 101 (!) 101  Resp:   15 14  Temp: (!) 100.6 F (38.1 C)     TempSrc: Oral     SpO2:   97% 96%  Weight:      Height:       Constitutional: appears age-appropriate, calm Eyes: PERRL, lids and conjunctivae normal ENMT: Mucous membranes are moist. Posterior pharynx clear of any exudate or lesions. Age-appropriate dentition.  Mild to moderate bilateral hearing loss Neck: normal, supple, no masses, no thyromegaly Respiratory: clear to auscultation bilaterally, no wheezing, no crackles. Normal respiratory effort. No accessory muscle use.  Cardiovascular: Regular rate and rhythm, no murmurs / rubs / gallops. No extremity edema. 2+ pedal pulses. No carotid bruits.  Abdomen: Obese abdomen, no tenderness, no masses palpated, no hepatosplenomegaly. Bowel sounds positive.  Musculoskeletal: no clubbing / cyanosis. No joint deformity upper and lower extremities. Good ROM, no contractures, no atrophy. Normal muscle tone.  Skin: no rashes, lesions, ulcers. No induration Neurologic: Sensation intact. Strength 5/5 in all 4.  Psychiatric: Normal judgment and insight. Alert and oriented x 3. Normal mood.   EKG: independently reviewed, showing sinus tachycardia with rate of 127, QTc 444, right bundle branch block  Chest x-ray on Admission: I personally reviewed and I agree with radiologist reading as below.  CT ABDOMEN PELVIS W CONTRAST Result Date: 04/15/2023 CLINICAL DATA:  Sepsis Abdominal pain, acute, nonlocalized. Chest pain radiating to the back. Dyspnea. EXAM: CT ABDOMEN AND PELVIS WITH CONTRAST TECHNIQUE: Multidetector CT imaging of the abdomen and pelvis was performed using the standard protocol following bolus administration of intravenous contrast. RADIATION DOSE REDUCTION: This exam was performed according to the departmental dose-optimization program which includes automated exposure control, adjustment of the mA and/or kV according  to patient size and/or use of iterative reconstruction technique. CONTRAST:  60mL OMNIPAQUE  IOHEXOL  300 MG/ML  SOLN COMPARISON:  Right upper quadrant abdominal sonogram and chest CT angiogram from earlier today. FINDINGS: Lower chest: No significant pulmonary nodules or acute consolidative airspace disease. Coronary atherosclerosis. Hepatobiliary: Normal liver size. No liver mass. Normal gallbladder with no radiopaque cholelithiasis. No biliary ductal dilatation. CBD diameter 5 mm. Pancreas: Normal, with no mass or duct dilation. Spleen: Normal size. No mass. Adrenals/Urinary Tract: Normal adrenals. No hydronephrosis. Small parapelvic simple left renal cysts, for which no follow-up imaging is recommended. No suspicious renal masses. Excreted contrast noted within bilateral urinary tracts in bladder. Normal bladder. Stomach/Bowel: Small hiatal hernia. Otherwise normal stomach. Normal caliber small bowel with no small bowel wall thickening. Appendix not discretely visualized. No pericecal inflammatory  changes. Moderate diffuse colorectal stool volume. No large bowel wall thickening, diverticulosis or significant pericolonic fat stranding. Vascular/Lymphatic: Atherosclerotic nonaneurysmal abdominal aorta. Patent portal, splenic, hepatic and renal veins. No pathologically enlarged lymph nodes in the abdomen or pelvis. Reproductive: Status post hysterectomy, with no abnormal findings at the vaginal cuff. No adnexal mass. Other: No pneumoperitoneum, ascites or focal fluid collection. Musculoskeletal: No aggressive appearing focal osseous lesions. Mild lumbar spondylosis. IMPRESSION: 1. No acute abnormality. No evidence of bowel obstruction or acute bowel inflammation. 2. Moderate diffuse colorectal stool volume, suggesting constipation. 3. Small hiatal hernia. 4. Coronary atherosclerosis. 5.  Aortic Atherosclerosis (ICD10-I70.0). Electronically Signed   By: Selinda DELENA Blue M.D.   On: 04/15/2023 16:48   US  ABDOMEN LIMITED  RUQ (LIVER/GB) Result Date: 04/15/2023 CLINICAL DATA:  Right upper quadrant pain as well as chest pain radiating to back since yesterday. EXAM: ULTRASOUND ABDOMEN LIMITED RIGHT UPPER QUADRANT COMPARISON:  None Available. FINDINGS: Gallbladder: No gallstones or wall thickening visualized. No sonographic Murphy sign noted by sonographer. Common bile duct: Diameter: 7.8 mm which is at the upper limits of normal in diameter. Liver: No focal lesion identified. Within normal limits in parenchymal echogenicity. Portal vein is patent on color Doppler imaging with normal direction of blood flow towards the liver. Other: None. IMPRESSION: Common bile duct at the upper limits of normal in diameter. Recommend correlation with LFTs. Otherwise negative right upper quadrant ultrasound. Electronically Signed   By: Toribio Agreste M.D.   On: 04/15/2023 14:22   CT Angio Chest Aorta W and/or Wo Contrast Result Date: 04/15/2023 CLINICAL DATA:  Acute aortic syndrome Centralized chest pain radiating through to back EXAM: CT ANGIOGRAPHY CHEST WITH CONTRAST TECHNIQUE: Multidetector CT imaging of the chest was performed using the standard protocol during bolus administration of intravenous contrast. Multiplanar CT image reconstructions and MIPs were obtained to evaluate the vascular anatomy. RADIATION DOSE REDUCTION: This exam was performed according to the departmental dose-optimization program which includes automated exposure control, adjustment of the mA and/or kV according to patient size and/or use of iterative reconstruction technique. CONTRAST:  OMNIPAQUE  IOHEXOL  350 MG/ML SOLN COMPARISON:  None available. FINDINGS: Cardiovascular: Heart size is normal. Coronary calcifications are present. No pulmonary artery embolism. No aortic dissection. Mild scattered calcified atheromatous plaque seen throughout the thoracic aorta. Mediastinum/Nodes: No enlarged mediastinal, hilar, or axillary lymph nodes. Thyroid gland, trachea, and  esophagus demonstrate no significant findings. Lungs/Pleura: Lungs are clear. No pleural effusion or pneumothorax. Upper Abdomen: Small hiatal hernia is present. Visualized upper abdominal organs otherwise unremarkable. Musculoskeletal: No chest wall abnormality. No acute or significant osseous findings. Review of the MIP images confirms the above findings. IMPRESSION: No acute abnormality of the chest. No evidence of aortic dissection or pulmonary artery embolism. Electronically Signed   By: Aliene Lloyd M.D.   On: 04/15/2023 12:11   DG Chest 2 View Result Date: 04/15/2023 CLINICAL DATA:  Chest pain radiating through the back. EXAM: CHEST - 2 VIEW COMPARISON:  Chest radiograph dated 01/09/2021. FINDINGS: The heart size and mediastinal contours are within normal limits. Vascular calcifications are seen in the aortic arch. Both lungs are clear. Degenerative changes are seen in the spine. IMPRESSION: No active cardiopulmonary disease. Electronically Signed   By: Norman Hopper M.D.   On: 04/15/2023 11:27   Labs on Admission: I have personally reviewed following labs  CBC: Recent Labs  Lab 04/15/23 1035  WBC 14.1*  HGB 11.0*  HCT 33.5*  MCV 92.5  PLT 324   Basic Metabolic  Panel: Recent Labs  Lab 04/15/23 1035  NA 137  K 4.0  CL 102  CO2 22  GLUCOSE 189*  BUN 16  CREATININE 1.01*  CALCIUM  9.1   GFR: Estimated Creatinine Clearance: 41.6 mL/min (A) (by C-G formula based on SCr of 1.01 mg/dL (H)).  Liver Function Tests: Recent Labs  Lab 04/15/23 1035  AST 45*  ALT 32  ALKPHOS 42  BILITOT 0.8  PROT 6.4*  ALBUMIN 4.0   Recent Labs  Lab 04/15/23 1035  LIPASE 37   Urine analysis:    Component Value Date/Time   COLORURINE YELLOW (A) 04/15/2023 1112   APPEARANCEUR CLEAR (A) 04/15/2023 1112   LABSPEC 1.015 04/15/2023 1112   PHURINE 6.0 04/15/2023 1112   GLUCOSEU NEGATIVE 04/15/2023 1112   HGBUR NEGATIVE 04/15/2023 1112   BILIRUBINUR NEGATIVE 04/15/2023 1112   KETONESUR  NEGATIVE 04/15/2023 1112   PROTEINUR NEGATIVE 04/15/2023 1112   NITRITE NEGATIVE 04/15/2023 1112   LEUKOCYTESUR NEGATIVE 04/15/2023 1112   This document was prepared using Dragon Voice Recognition software and may include unintentional dictation errors.  Dr. Sherre [facility]  If 7PM-7AM, please contact overnight-coverage provider If 7AM-7PM, please contact day attending provider [email or web address]  04/15/2023, 5:13 PM

## 2023-04-15 NOTE — Assessment & Plan Note (Addendum)
-   Cardiology has been consulted ?

## 2023-04-15 NOTE — Assessment & Plan Note (Signed)
 Atypical chest pain, concerning for unstable angina High sensitive troponin were negative x 2 Symptoms are concerning as they are relieved with rest Left heart cath in 01/30/2021: Post IVUS guided PCI placement to proximal RCA.  Of note proximal and mid LAD had 50% stenosis and lateral first diagonal lesion had 50% stenosis. Complete echo on 01/14/2021: Was read as normal left ventricular systolic function with mild LVH, estimated LV ejection fraction is greater than 55%. Cardiology has been consulted, Dr. Florencio has been consulted for consideration of inpatient stress testing Nitroglycerin  1 inch every 6 hours as needed for chest pain ordered

## 2023-04-15 NOTE — Assessment & Plan Note (Signed)
 At baseline

## 2023-04-15 NOTE — Assessment & Plan Note (Signed)
 -  Insulin SSI with at bedtime coverage ordered ?- Goal inpatient blood glucose levels 140-180 ?

## 2023-04-16 DIAGNOSIS — E039 Hypothyroidism, unspecified: Secondary | ICD-10-CM

## 2023-04-16 DIAGNOSIS — I10 Essential (primary) hypertension: Secondary | ICD-10-CM

## 2023-04-16 DIAGNOSIS — E119 Type 2 diabetes mellitus without complications: Secondary | ICD-10-CM

## 2023-04-16 DIAGNOSIS — N1831 Chronic kidney disease, stage 3a: Secondary | ICD-10-CM

## 2023-04-16 DIAGNOSIS — R197 Diarrhea, unspecified: Secondary | ICD-10-CM

## 2023-04-16 DIAGNOSIS — R0789 Other chest pain: Secondary | ICD-10-CM

## 2023-04-16 LAB — GASTROINTESTINAL PANEL BY PCR, STOOL (REPLACES STOOL CULTURE)

## 2023-04-16 LAB — GLUCOSE, CAPILLARY
Glucose-Capillary: 139 mg/dL — ABNORMAL HIGH (ref 70–99)
Glucose-Capillary: 166 mg/dL — ABNORMAL HIGH (ref 70–99)

## 2023-04-16 LAB — CBC
HCT: 27.6 % — ABNORMAL LOW (ref 36.0–46.0)
Hemoglobin: 9.1 g/dL — ABNORMAL LOW (ref 12.0–15.0)
MCH: 30.3 pg (ref 26.0–34.0)
MCHC: 33 g/dL (ref 30.0–36.0)
MCV: 92 fL (ref 80.0–100.0)
Platelets: 248 10*3/uL (ref 150–400)
RBC: 3 MIL/uL — ABNORMAL LOW (ref 3.87–5.11)
RDW: 13.7 % (ref 11.5–15.5)
WBC: 14.1 10*3/uL — ABNORMAL HIGH (ref 4.0–10.5)
nRBC: 0 % (ref 0.0–0.2)

## 2023-04-16 LAB — CBG MONITORING, ED
Glucose-Capillary: 121 mg/dL — ABNORMAL HIGH (ref 70–99)
Glucose-Capillary: 172 mg/dL — ABNORMAL HIGH (ref 70–99)

## 2023-04-16 LAB — BASIC METABOLIC PANEL
Anion gap: 10 (ref 5–15)
BUN: 13 mg/dL (ref 8–23)
CO2: 22 mmol/L (ref 22–32)
Calcium: 8.2 mg/dL — ABNORMAL LOW (ref 8.9–10.3)
Chloride: 107 mmol/L (ref 98–111)
Creatinine, Ser: 0.95 mg/dL (ref 0.44–1.00)
GFR, Estimated: >60 mL/min (ref >60)
Glucose, Bld: 107 mg/dL — ABNORMAL HIGH (ref 70–99)
Potassium: 3.4 mmol/L — ABNORMAL LOW (ref 3.5–5.1)
Sodium: 139 mmol/L (ref 135–145)

## 2023-04-16 LAB — HEMOGLOBIN A1C
Hgb A1c MFr Bld: 7.3 % — ABNORMAL HIGH (ref 4.8–5.6)
Mean Plasma Glucose: 163 mg/dL

## 2023-04-16 LAB — LACTIC ACID, PLASMA: Lactic Acid, Venous: 1.5 mmol/L (ref 0.5–1.9)

## 2023-04-16 LAB — C DIFFICILE QUICK SCREEN W PCR REFLEX
C Diff antigen: NEGATIVE
C Diff interpretation: NOT DETECTED
C Diff toxin: NEGATIVE

## 2023-04-16 MED ORDER — POTASSIUM CHLORIDE 20 MEQ PO PACK
40.0000 meq | PACK | Freq: Once | ORAL | Status: AC
  Administered 2023-04-16: 40 meq via ORAL
  Filled 2023-04-16: qty 2

## 2023-04-16 MED ORDER — VITAMIN C 500 MG PO TABS
500.0000 mg | ORAL_TABLET | Freq: Every day | ORAL | Status: DC
  Administered 2023-04-17: 500 mg via ORAL
  Filled 2023-04-16: qty 1

## 2023-04-16 MED ORDER — FERROUS SULFATE 325 (65 FE) MG PO TABS
325.0000 mg | ORAL_TABLET | Freq: Every day | ORAL | Status: DC
  Administered 2023-04-16: 325 mg via ORAL
  Filled 2023-04-16: qty 1

## 2023-04-16 MED ORDER — FLUTICASONE PROPIONATE 50 MCG/ACT NA SUSP
2.0000 | Freq: Every day | NASAL | Status: DC
  Administered 2023-04-16 – 2023-04-17 (×2): 2 via NASAL
  Filled 2023-04-16: qty 16

## 2023-04-16 MED ORDER — OYSTER SHELL CALCIUM/D3 500-5 MG-MCG PO TABS
1.0000 | ORAL_TABLET | Freq: Two times a day (BID) | ORAL | Status: DC
  Administered 2023-04-16 – 2023-04-17 (×2): 1 via ORAL
  Filled 2023-04-16 (×2): qty 1

## 2023-04-16 MED ORDER — VITAMIN B-12 1000 MCG PO TABS
500.0000 ug | ORAL_TABLET | Freq: Every day | ORAL | Status: DC
  Administered 2023-04-17: 500 ug via ORAL
  Filled 2023-04-16: qty 1

## 2023-04-16 MED ORDER — ADULT MULTIVITAMIN W/MINERALS CH
1.0000 | ORAL_TABLET | Freq: Every day | ORAL | Status: DC
  Administered 2023-04-16: 1 via ORAL
  Filled 2023-04-16: qty 1

## 2023-04-16 MED ORDER — ATORVASTATIN CALCIUM 20 MG PO TABS
80.0000 mg | ORAL_TABLET | Freq: Every evening | ORAL | Status: DC
Start: 2023-04-16 — End: 2023-04-17
  Administered 2023-04-16: 80 mg via ORAL
  Filled 2023-04-16: qty 4

## 2023-04-16 NOTE — Plan of Care (Signed)

## 2023-04-16 NOTE — ED Notes (Signed)
 Assisted to bedside commode and back into bed.

## 2023-04-16 NOTE — Progress Notes (Signed)
 Progress Note   Patient: [name] FMW:[id number] DOB: May 12, 1945 DOA: 04/15/2023     1 DOS: the patient was seen and examined on 04/16/2023   Brief hospital course: [name] is a 78 year old female with history of chronic sinusitis, non-insulin -dependent diabetes mellitus, hyperlipidemia, hypertension, hypothyroid, who presents emergency department for chief concerns of chest pain with radiation to her back and shortness of breath that started on 04/14/2023.  She had fever, tachycardia.  ED provider started sepsis protocol, got broad-spectrum antibiotics.  Patient is admitted to the hospitalist service for further management evaluation of chest pain.  Her troponin remain negative.  Assessment and Plan: * Chest pain Chest pain with some typical features. Troponin remain flat. Echocardiogram pending. Not taking aspirin  due to allergy to NSAIDs. Check A1c, lipid profile. Cardiology consult appreciated, advised to evaluate for noncardiac causes.  SIRS Fever, leukocytosis, elevated lactic acid, She is noted having diarrhea.  Possible viral illness.  Patient got IV Vanco, cefepime  and Flagyl  in the ED. Blood cultures so far negative. Pro-Cal negative.  GI panel negative. CTA of the chest was negative for PE or infectious etiology and dissection CTA abdomen pelvis with contrast was negative for CT evidence of infection Will hold antibiotics for now. Continue to monitor for persistent fever.  Hypothyroidism Resume Home levothyroxine  125 mcg daily resumed  CKD stage 3a, GFR 45-59 ml/min (HCC) Creatinine baseline.  Avoid nephrotoxic drugs.  Essential hypertension Not on any home medications. If blood pressure persistently high consider Norvasc. Hydralazine  5 mg IV every 6 hours as needed for SBP > 170, 5 days ordered  Diabetes mellitus type 2, noninsulin dependent (HCC) Insulin  SSI with at bedtime coverage ordered Goal inpatient blood glucose levels 140-180     Out  of bed to chair. Incentive spirometry. Nursing supportive care. Fall, aspiration precautions. DVT prophylaxis   Code Status: Full Code  Subjective: Patient is seen and examined today morning.  She denies any chest pain, shortness of breath or palpitations.  Diarrhea improved.  Last bowel movement yesterday.  Remained afebrile.  Physical Exam: Vitals:   04/16/23 0300 04/16/23 0600 04/16/23 1000 04/16/23 1100  BP: (!) 121/47 (!) 115/50 (!) 109/45 (!) 117/54  Pulse: 72 73 68 (!) 58  Resp: 11 17 17 15   Temp: 99.1 F (37.3 C) 98.4 F (36.9 C) 98.4 F (36.9 C)   TempSrc: Oral Oral Oral   SpO2: 91% 94% 92% 90%  Weight:      Height:        General - Elderly Caucasian female, no apparent distress HEENT - PERRLA, EOMI, atraumatic head, non tender sinuses. Lung - Clear, basal rales, rhonchi, no wheezes. Heart - S1, S2 heard, no murmurs, rubs, no pedal edema. Abdomen - Soft, non tender, bowel sounds good Neuro - Alert, awake and oriented x 3, non focal exam. Skin - Warm and dry.  Data Reviewed:      Latest Ref Rng & Units 04/16/2023    2:28 AM 04/15/2023   10:35 AM 01/31/2021    5:34 AM  CBC  WBC 4.0 - 10.5 K/uL 14.1  14.1  8.1   Hemoglobin 12.0 - 15.0 g/dL 9.1  88.9  88.1   Hematocrit 36.0 - 46.0 % 27.6  33.5  36.3   Platelets 150 - 400 K/uL 248  324  343       Latest Ref Rng & Units 04/16/2023    2:28 AM 04/15/2023   10:35 AM 06/10/2022    8:07 AM  BMP  Glucose 70 - 99 mg/dL 892  810    BUN 8 - 23 mg/dL 13  16    Creatinine 9.55 - 1.00 mg/dL 9.04  8.98    Sodium 864 - 145 mmol/L 139  137    Potassium 3.5 - 5.1 mmol/L 3.4  4.0  3.8   Chloride 98 - 111 mmol/L 107  102    CO2 22 - 32 mmol/L 22  22    Calcium  8.9 - 10.3 mg/dL 8.2  9.1     CT ABDOMEN PELVIS W CONTRAST Result Date: 04/15/2023 CLINICAL DATA:  Sepsis Abdominal pain, acute, nonlocalized. Chest pain radiating to the back. Dyspnea. EXAM: CT ABDOMEN AND PELVIS WITH CONTRAST TECHNIQUE: Multidetector CT imaging of the  abdomen and pelvis was performed using the standard protocol following bolus administration of intravenous contrast. RADIATION DOSE REDUCTION: This exam was performed according to the departmental dose-optimization program which includes automated exposure control, adjustment of the mA and/or kV according to patient size and/or use of iterative reconstruction technique. CONTRAST:  60mL OMNIPAQUE  IOHEXOL  300 MG/ML  SOLN COMPARISON:  Right upper quadrant abdominal sonogram and chest CT angiogram from earlier today. FINDINGS: Lower chest: No significant pulmonary nodules or acute consolidative airspace disease. Coronary atherosclerosis. Hepatobiliary: Normal liver size. No liver mass. Normal gallbladder with no radiopaque cholelithiasis. No biliary ductal dilatation. CBD diameter 5 mm. Pancreas: Normal, with no mass or duct dilation. Spleen: Normal size. No mass. Adrenals/Urinary Tract: Normal adrenals. No hydronephrosis. Small parapelvic simple left renal cysts, for which no follow-up imaging is recommended. No suspicious renal masses. Excreted contrast noted within bilateral urinary tracts in bladder. Normal bladder. Stomach/Bowel: Small hiatal hernia. Otherwise normal stomach. Normal caliber small bowel with no small bowel wall thickening. Appendix not discretely visualized. No pericecal inflammatory changes. Moderate diffuse colorectal stool volume. No large bowel wall thickening, diverticulosis or significant pericolonic fat stranding. Vascular/Lymphatic: Atherosclerotic nonaneurysmal abdominal aorta. Patent portal, splenic, hepatic and renal veins. No pathologically enlarged lymph nodes in the abdomen or pelvis. Reproductive: Status post hysterectomy, with no abnormal findings at the vaginal cuff. No adnexal mass. Other: No pneumoperitoneum, ascites or focal fluid collection. Musculoskeletal: No aggressive appearing focal osseous lesions. Mild lumbar spondylosis. IMPRESSION: 1. No acute abnormality. No evidence  of bowel obstruction or acute bowel inflammation. 2. Moderate diffuse colorectal stool volume, suggesting constipation. 3. Small hiatal hernia. 4. Coronary atherosclerosis. 5.  Aortic Atherosclerosis (ICD10-I70.0). Electronically Signed   By: Selinda DELENA Blue M.D.   On: 04/15/2023 16:48   US  ABDOMEN LIMITED RUQ (LIVER/GB) Result Date: 04/15/2023 CLINICAL DATA:  Right upper quadrant pain as well as chest pain radiating to back since yesterday. EXAM: ULTRASOUND ABDOMEN LIMITED RIGHT UPPER QUADRANT COMPARISON:  None Available. FINDINGS: Gallbladder: No gallstones or wall thickening visualized. No sonographic Murphy sign noted by sonographer. Common bile duct: Diameter: 7.8 mm which is at the upper limits of normal in diameter. Liver: No focal lesion identified. Within normal limits in parenchymal echogenicity. Portal vein is patent on color Doppler imaging with normal direction of blood flow towards the liver. Other: None. IMPRESSION: Common bile duct at the upper limits of normal in diameter. Recommend correlation with LFTs. Otherwise negative right upper quadrant ultrasound. Electronically Signed   By: Toribio Agreste M.D.   On: 04/15/2023 14:22   CT Angio Chest Aorta W and/or Wo Contrast Result Date: 04/15/2023 CLINICAL DATA:  Acute aortic syndrome Centralized chest pain radiating through to back EXAM: CT ANGIOGRAPHY CHEST WITH CONTRAST TECHNIQUE: Multidetector CT imaging of the chest  was performed using the standard protocol during bolus administration of intravenous contrast. Multiplanar CT image reconstructions and MIPs were obtained to evaluate the vascular anatomy. RADIATION DOSE REDUCTION: This exam was performed according to the departmental dose-optimization program which includes automated exposure control, adjustment of the mA and/or kV according to patient size and/or use of iterative reconstruction technique. CONTRAST:  OMNIPAQUE  IOHEXOL  350 MG/ML SOLN COMPARISON:  None available. FINDINGS:  Cardiovascular: Heart size is normal. Coronary calcifications are present. No pulmonary artery embolism. No aortic dissection. Mild scattered calcified atheromatous plaque seen throughout the thoracic aorta. Mediastinum/Nodes: No enlarged mediastinal, hilar, or axillary lymph nodes. Thyroid gland, trachea, and esophagus demonstrate no significant findings. Lungs/Pleura: Lungs are clear. No pleural effusion or pneumothorax. Upper Abdomen: Small hiatal hernia is present. Visualized upper abdominal organs otherwise unremarkable. Musculoskeletal: No chest wall abnormality. No acute or significant osseous findings. Review of the MIP images confirms the above findings. IMPRESSION: No acute abnormality of the chest. No evidence of aortic dissection or pulmonary artery embolism. Electronically Signed   By: Aliene Lloyd M.D.   On: 04/15/2023 12:11   DG Chest 2 View Result Date: 04/15/2023 CLINICAL DATA:  Chest pain radiating through the back. EXAM: CHEST - 2 VIEW COMPARISON:  Chest radiograph dated 01/09/2021. FINDINGS: The heart size and mediastinal contours are within normal limits. Vascular calcifications are seen in the aortic arch. Both lungs are clear. Degenerative changes are seen in the spine. IMPRESSION: No active cardiopulmonary disease. Electronically Signed   By: Norman Hopper M.D.   On: 04/15/2023 11:27   Family Communication: Discussed with patient and husband at bedside, they understand and agree. All questions answereed.  Disposition: Status is: Inpatient Remains inpatient appropriate because: fever work up, cardiac work up  Planned Discharge Destination: Home     Time spent: 36 minutes  Author: Concepcion Riser, MD 04/16/2023 12:58 PM Secure chat 7am to 7pm For on call review [email or web address].

## 2023-04-17 ENCOUNTER — Other Ambulatory Visit: Payer: Medicare Other

## 2023-04-17 ENCOUNTER — Inpatient Hospital Stay
Admit: 2023-04-17 | Discharge: 2023-04-17 | Disposition: A | Discharge status: Home / Self Care | Payer: Medicare Other | Attending: Internal Medicine | Admitting: Internal Medicine

## 2023-04-17 DIAGNOSIS — R079 Chest pain, unspecified: Secondary | ICD-10-CM | POA: Diagnosis not present

## 2023-04-17 LAB — ECHOCARDIOGRAM COMPLETE
AR max vel: 1.92 cm2
AV Area VTI: 2.02 cm2
AV Area mean vel: 1.99 cm2
AV Mean grad: 3 mm[Hg]
AV Peak grad: 6.5 mm[Hg]
Ao pk vel: 1.27 m/s
Area-P 1/2: 4.63 cm2
Calc EF: 64.6 %
Height: 62 in
MV VTI: 1.41 cm2
S' Lateral: 2.5 cm
Single Plane A2C EF: 64.9 %
Single Plane A4C EF: 63 %
Weight: 2176 [oz_av]

## 2023-04-17 LAB — GLUCOSE, CAPILLARY
Glucose-Capillary: 195 mg/dL — ABNORMAL HIGH (ref 70–99)
Glucose-Capillary: 204 mg/dL — ABNORMAL HIGH (ref 70–99)

## 2023-04-17 MED ORDER — LEVOTHYROXINE SODIUM 50 MCG PO TABS: 125.0000 ug | ORAL_TABLET | ORAL | Status: DC

## 2023-04-17 NOTE — Care Management Important Message (Signed)
 Important Message  Patient Details  Name: [name] MRN: [id number] Date of Birth: [birth year]   Important Message Given:  Yes - Medicare IM     Sherilyn Banker 04/17/2023, 12:11 PM

## 2023-04-17 NOTE — Progress Notes (Signed)
Discharge instructions reviewed with the patient.  Patient sent out via wheelchair with belongings 

## 2023-04-17 NOTE — TOC CM/SW Note (Signed)
 Transition of Care [facility]) - Inpatient Brief Assessment   Patient Details  Name: [name] MRN: [id number] Date of Birth: Nov 26, 1945  Transition of Care [facility] CM/SW Contact:    Lauraine JAYSON Carpen, LCSW Phone Number: 04/17/2023, 4:45 PM   Clinical Narrative: Patient has orders to discharge home today. Chart reviewed. No TOC needs identified. CSW signing off.  Transition of Care Asessment: Insurance and Status: Insurance coverage has been reviewed Patient has primary care physician: Yes Home environment has been reviewed: Single family home Prior level of function:: Not documented Prior/Current Home Services: No current home services Social Drivers of Health Review: SDOH reviewed no interventions necessary Readmission risk has been reviewed: Yes Transition of care needs: no transition of care needs at this time

## 2023-04-17 NOTE — Discharge Summary (Signed)
 Physician Discharge Summary   Patient: YARIELA TISON MRN: [id number]  DOB: [birth year]   Admit:     Date of Admission: 04/15/2023 Admitted from: home   Discharge: Date of discharge: 04/17/23 Disposition: Home Condition at discharge: good  CODE STATUS: FULL CODE     Discharge Physician: Laneta Blunt, DO [facility]     PCP: Sadie Manna, MD  Recommendations for Outpatient Follow-up:  Follow up with PCP Sadie Manna, MD in 1-2 weeks Please obtain labs/tests: review echo results in detail, check BP, cosndier CBC/BMP in 1-2 weeks w/ PCP    Discharge Instructions     Diet - low sodium heart healthy   Complete by: As directed    Diet Carb Modified   Complete by: As directed    Increase activity slowly   Complete by: As directed          Discharge Diagnoses: Principal Problem:   Chest pain Active Problems:   Unstable angina (HCC)   Leukocytosis   Diabetes mellitus type 2, noninsulin dependent (HCC)   Essential hypertension   Hyperlipidemia   CKD stage 3a, GFR 45-59 ml/min [facility])   Hypothyroidism   Diarrhea       Hospital Course: Hospital course / significant events:   HPI: Ms. Dorsie Sethi is a 78 year old female with history of chronic sinusitis, non-insulin -dependent diabetes mellitus, hyperlipidemia, hypertension, hypothyroid, who presents emergency department for chief concerns of chest pain with radiation to her back and shortness of breath that started on 04/14/2023.  ED w/u nothing significant, admitted to hospitalist for chest pain r/o ACS. Echo pending. Cardio consult - advised noncardiac. Echo ordered 1/1 has been pending, done and read 01/03 E 70-75, G2DD (+)SIRS w/o infectious source found, afebrile off abx, suspect viral source which is self-resolving     Consultants:  Cardiology   Procedures/Surgeries: none      ASSESSMENT & PLAN:   Chest pain Chest pain with some typical features. Troponin remain  flat. Echocardiogram no concerns, follow w/ PCP. Not taking aspirin  due to allergy to NSAIDs. Check A1c, lipid profile. Cardiology consult appreciated, advised to evaluate for noncardiac causes.   SIRS question mild sepsis d/t viral illness nonspecific  Fever, leukocytosis, elevated lactic acid, She is noted having diarrhea.  Possible viral illness.  Patient got IV Vanco, cefepime  and Flagyl  in the ED. Blood cultures so far negative. Pro-Cal negative.  GI panel negative. CTA of the chest was negative for PE or infectious etiology and dissection CTA abdomen pelvis with contrast was negative for CT evidence of infection Will hold antibiotics  Continue to monitor for persistent fever.   Hypothyroidism Resume Home levothyroxine  125 mcg daily resumed   CKD stage 3a, GFR 45-59 ml/min (HCC) Creatinine baseline.  Avoid nephrotoxic drugs.   Essential hypertension Hydrochlorothiazide at home every other day  Follow outpatient    Diabetes mellitus type 2, noninsulin dependent (HCC) Insulin  SSI with at bedtime coverage ordered Goal inpatient blood glucose levels 140-180             Discharge Instructions  Allergies as of 04/17/2023       Reactions   Ibuprofen Shortness Of Breath   Other reaction(s): Other (See Comments) feels like an elephant sitting on chest   Propoxyphene Nausea And Vomiting   Tramadol Nausea And Vomiting   Ciprofloxacin Hives, Itching   Hydrocodone  Nausea And Vomiting   Oxycodone  Nausea And Vomiting   Pseudoephedrine Other (See Comments)   Insomnia  Medication List     TAKE these medications    acetaminophen  500 MG tablet Commonly known as: TYLENOL  Take 500 mg by mouth 2 (two) times daily as needed for mild pain.   ascorbic acid  500 MG tablet Commonly known as: VITAMIN C  Take 500 mg by mouth daily with lunch.   atorvastatin  80 MG tablet Commonly known as: Lipitor  Take 1 tablet (80 mg total) by mouth daily.   CALCIUM  600+D  PO Take 1 tablet by mouth 2 (two) times daily with a meal.   fenofibrate 48 MG tablet Commonly known as: TRICOR Take 48 mg by mouth daily with lunch.   ferrous sulfate  325 (65 FE) MG tablet Take 325 mg by mouth daily with supper.   Fish Oil 1000 MG Caps Take 1,000 mg by mouth daily with breakfast.   fluticasone  50 MCG/ACT nasal spray Commonly known as: FLONASE  Place 2 sprays into both nostrils daily.   glipiZIDE 10 MG tablet Commonly known as: GLUCOTROL Take 10 mg by mouth 2 (two) times daily before a meal.   hydrochlorothiazide 12.5 MG tablet Commonly known as: HYDRODIURIL Take 12.5 mg by mouth every other day.   ICAPS PO Take 1 tablet by mouth daily with breakfast.   levothyroxine  125 MCG tablet Commonly known as: SYNTHROID  Take 125 mcg by mouth daily before breakfast. Monday through Saturday   metFORMIN 1000 MG tablet Commonly known as: GLUCOPHAGE Take 1,000 mg by mouth 2 (two) times daily.   multivitamin with minerals Tabs tablet Take 1 tablet by mouth daily with supper.   vitamin B-12 500 MCG tablet Commonly known as: CYANOCOBALAMIN  Take 500 mcg by mouth daily with breakfast.         Follow-up Information     Sadie Manna, MD. Schedule an appointment as soon as possible for a visit.   Specialty: Internal Medicine Why: hospital follow-up, ideally within 1-2 weeks Contact information: [address] [place] [address] [phone]                 Allergies  Allergen Reactions   Ibuprofen Shortness Of Breath    Other reaction(s): Other (See Comments) feels like an elephant sitting on chest   Propoxyphene Nausea And Vomiting   Tramadol Nausea And Vomiting   Ciprofloxacin Hives and Itching   Hydrocodone  Nausea And Vomiting   Oxycodone  Nausea And Vomiting   Pseudoephedrine Other (See Comments)    Insomnia     Subjective: pt reports feeling well this mornig, no concerns, no chest pain, no  SOB   Discharge Exam: BP (!) 156/59 (BP Location: Left Arm)   Pulse (!) 55   Temp 98 F (36.7 C) (Oral)   Resp 16   Ht 5' 2 (1.575 m)   Wt 61.7 kg   SpO2 96%   BMI 24.87 kg/m  General: Pt is alert, awake, not in acute distress Cardiovascular: RRR, S1/S2 +, no rubs, no gallops Respiratory: CTA bilaterally, no wheezing, no rhonchi Abdominal: Soft, NT, ND, bowel sounds + Extremities: no edema, no cyanosis     The results of significant diagnostics from this hospitalization (including imaging, microbiology, ancillary and laboratory) are listed below for reference.     Microbiology: Recent Results (from the past 240 hours)  Resp panel by RT-PCR (RSV, Flu A&B, Covid) Anterior Nasal Swab     Status: None   Collection Time: 04/15/23 11:12 AM   Specimen: Anterior Nasal Swab  Result Value Ref Range Status   SARS Coronavirus 2  by RT PCR NEGATIVE NEGATIVE Final    Comment: (NOTE) SARS-CoV-2 target nucleic acids are NOT DETECTED.  The SARS-CoV-2 RNA is generally detectable in upper respiratory specimens during the acute phase of infection. The lowest concentration of SARS-CoV-2 viral copies this assay can detect is 138 copies/mL. A negative result does not preclude SARS-Cov-2 infection and should not be used as the sole basis for treatment or other patient management decisions. A negative result may occur with  improper specimen collection/handling, submission of specimen other than nasopharyngeal swab, presence of viral mutation(s) within the areas targeted by this assay, and inadequate number of viral copies(<138 copies/mL). A negative result must be combined with clinical observations, patient history, and epidemiological information. The expected result is Negative.  Fact Sheet for Patients:  bloggercourse.com  Fact Sheet for Healthcare Providers:  seriousbroker.it  This test is no t yet approved or cleared by the [place]  [place] FDA and  has been authorized for detection and/or diagnosis of SARS-CoV-2 by FDA under an Emergency Use Authorization (EUA). This EUA will remain  in effect (meaning this test can be used) for the duration of the COVID-19 declaration under Section 564(b)(1) of the Act, 21 U.S.C.section 360bbb-3(b)(1), unless the authorization is terminated  or revoked sooner.       Influenza A by PCR NEGATIVE NEGATIVE Final   Influenza B by PCR NEGATIVE NEGATIVE Final    Comment: (NOTE) The Xpert Xpress SARS-CoV-2/FLU/RSV plus assay is intended as an aid in the diagnosis of influenza from Nasopharyngeal swab specimens and should not be used as a sole basis for treatment. Nasal washings and aspirates are unacceptable for Xpert Xpress SARS-CoV-2/FLU/RSV testing.  Fact Sheet for Patients: bloggercourse.com  Fact Sheet for Healthcare Providers: seriousbroker.it  This test is not yet approved or cleared by the [place]  FDA and has been authorized for detection and/or diagnosis of SARS-CoV-2 by FDA under an Emergency Use Authorization (EUA). This EUA will remain in effect (meaning this test can be used) for the duration of the COVID-19 declaration under Section 564(b)(1) of the Act, 21 U.S.C. section 360bbb-3(b)(1), unless the authorization is terminated or revoked.     Resp Syncytial Virus by PCR NEGATIVE NEGATIVE Final    Comment: (NOTE) Fact Sheet for Patients: bloggercourse.com  Fact Sheet for Healthcare Providers: seriousbroker.it  This test is not yet approved or cleared by the [place]  FDA and has been authorized for detection and/or diagnosis of SARS-CoV-2 by FDA under an Emergency Use Authorization (EUA). This EUA will remain in effect (meaning this test can be used) for the duration of the COVID-19 declaration under Section 564(b)(1) of the Act, 21 U.S.C. section  360bbb-3(b)(1), unless the authorization is terminated or revoked.  Performed at [facility], [address] [place], [place] [address]   Blood culture (single)     Status: None (Preliminary result)   Collection Time: 04/15/23 11:36 AM   Specimen: BLOOD  Result Value Ref Range Status   Specimen Description BLOOD BLOOD RIGHT WRIST  Final   Special Requests   Final    BOTTLES DRAWN AEROBIC AND ANAEROBIC Blood Culture results may not be optimal due to an inadequate volume of blood received in culture bottles   Culture   Final    NO GROWTH 2 DAYS Performed at [facility], [address] [place], [place] [address]    Report Status PENDING  Incomplete  Culture, blood (single)     Status: None (Preliminary result)   Collection Time: 04/15/23  1:39 PM   Specimen: BLOOD  Result Value Ref Range Status   Specimen Description BLOOD BLOOD LEFT ARM  Final   Special Requests   Final    BOTTLES DRAWN AEROBIC AND ANAEROBIC Blood Culture results may not be optimal due to an inadequate volume of blood received in culture bottles   Culture   Final    NO GROWTH 2 DAYS Performed at [facility], [address], [place], [place] [address]    Report Status PENDING  Incomplete  Gastrointestinal Panel by PCR , Stool     Status: None   Collection Time: 04/16/23 11:58 AM   Specimen: Stool  Result Value Ref Range Status   Campylobacter species NOT DETECTED NOT DETECTED Final   Plesimonas shigelloides NOT DETECTED NOT DETECTED Final   Salmonella species NOT DETECTED NOT DETECTED Final   Yersinia enterocolitica NOT DETECTED NOT DETECTED Final   Vibrio species NOT DETECTED NOT DETECTED Final   Vibrio cholerae NOT DETECTED NOT DETECTED Final   Enteroaggregative E coli (EAEC) NOT DETECTED NOT DETECTED Final   Enteropathogenic E coli (EPEC) NOT DETECTED NOT DETECTED Final   Enterotoxigenic E coli (ETEC) NOT DETECTED NOT DETECTED Final   Shiga like toxin producing E coli  (STEC) NOT DETECTED NOT DETECTED Final   Shigella/Enteroinvasive E coli (EIEC) NOT DETECTED NOT DETECTED Final   Cryptosporidium NOT DETECTED NOT DETECTED Final   Cyclospora cayetanensis NOT DETECTED NOT DETECTED Final   Entamoeba histolytica NOT DETECTED NOT DETECTED Final   Giardia lamblia NOT DETECTED NOT DETECTED Final   Adenovirus F40/41 NOT DETECTED NOT DETECTED Final   Astrovirus NOT DETECTED NOT DETECTED Final   Norovirus GI/GII NOT DETECTED NOT DETECTED Final   Rotavirus A NOT DETECTED NOT DETECTED Final   Sapovirus (I, II, IV, and V) NOT DETECTED NOT DETECTED Final    Comment: Performed at [facility], [address] [place], [place] [address]  C Difficile Quick Screen w PCR reflex     Status: None   Collection Time: 04/16/23 11:58 AM   Specimen: Stool  Result Value Ref Range Status   C Diff antigen NEGATIVE NEGATIVE Final   C Diff toxin NEGATIVE NEGATIVE Final   C Diff interpretation No C. difficile detected.  Final    Comment: Performed at [facility], [address] [place], [place] [address]     Labs: BNP (last 3 results) No results for input(s): BNP in the last 8760 hours. Basic Metabolic Panel: Recent Labs  Lab 04/15/23 1035 04/16/23 0228  NA 137 139  K 4.0 3.4*  CL 102 107  CO2 22 22  GLUCOSE 189* 107*  BUN 16 13  CREATININE 1.01* 0.95  CALCIUM  9.1 8.2*   Liver Function Tests: Recent Labs  Lab 04/15/23 1035  AST 45*  ALT 32  ALKPHOS 42  BILITOT 0.8  PROT 6.4*  ALBUMIN 4.0   Recent Labs  Lab 04/15/23 1035  LIPASE 37   No results for input(s): AMMONIA in the last 168 hours. CBC: Recent Labs  Lab 04/15/23 1035 04/16/23 0228  WBC 14.1* 14.1*  HGB 11.0* 9.1*  HCT 33.5* 27.6*  MCV 92.5 92.0  PLT 324 248   Cardiac Enzymes: No results for input(s): CKTOTAL, CKMB, CKMBINDEX, TROPONINI in the last 168 hours. BNP: Invalid input(s): POCBNP CBG: Recent Labs  Lab 04/16/23 1209 04/16/23 1629  04/16/23 2031 04/17/23 0820 04/17/23 1151  GLUCAP 172* 139* 166* 195* 204*   D-Dimer No results for input(s): DDIMER in  the last 72 hours. Hgb A1c Recent Labs    04/16/23 0228  HGBA1C 7.3*   Lipid Profile No results for input(s): CHOL, HDL, LDLCALC, TRIG, CHOLHDL, LDLDIRECT in the last 72 hours. Thyroid function studies Recent Labs    04/15/23 1339  TSH 0.268*   Anemia work up No results for input(s): VITAMINB12, FOLATE, FERRITIN, TIBC, IRON, RETICCTPCT in the last 72 hours. Urinalysis    Component Value Date/Time   COLORURINE YELLOW (A) 04/15/2023 1112   APPEARANCEUR CLEAR (A) 04/15/2023 1112   LABSPEC 1.015 04/15/2023 1112   PHURINE 6.0 04/15/2023 1112   GLUCOSEU NEGATIVE 04/15/2023 1112   HGBUR NEGATIVE 04/15/2023 1112   BILIRUBINUR NEGATIVE 04/15/2023 1112   KETONESUR NEGATIVE 04/15/2023 1112   PROTEINUR NEGATIVE 04/15/2023 1112   NITRITE NEGATIVE 04/15/2023 1112   LEUKOCYTESUR NEGATIVE 04/15/2023 1112   Sepsis Labs Recent Labs  Lab 04/15/23 1035 04/16/23 0228  WBC 14.1* 14.1*   Microbiology Recent Results (from the past 240 hours)  Resp panel by RT-PCR (RSV, Flu A&B, Covid) Anterior Nasal Swab     Status: None   Collection Time: 04/15/23 11:12 AM   Specimen: Anterior Nasal Swab  Result Value Ref Range Status   SARS Coronavirus 2 by RT PCR NEGATIVE NEGATIVE Final    Comment: (NOTE) SARS-CoV-2 target nucleic acids are NOT DETECTED.  The SARS-CoV-2 RNA is generally detectable in upper respiratory specimens during the acute phase of infection. The lowest concentration of SARS-CoV-2 viral copies this assay can detect is 138 copies/mL. A negative result does not preclude SARS-Cov-2 infection and should not be used as the sole basis for treatment or other patient management decisions. A negative result may occur with  improper specimen collection/handling, submission of specimen other than nasopharyngeal swab, presence of viral  mutation(s) within the areas targeted by this assay, and inadequate number of viral copies(<138 copies/mL). A negative result must be combined with clinical observations, patient history, and epidemiological information. The expected result is Negative.  Fact Sheet for Patients:  bloggercourse.com  Fact Sheet for Healthcare Providers:  seriousbroker.it  This test is no t yet approved or cleared by the [place]  FDA and  has been authorized for detection and/or diagnosis of SARS-CoV-2 by FDA under an Emergency Use Authorization (EUA). This EUA will remain  in effect (meaning this test can be used) for the duration of the COVID-19 declaration under Section 564(b)(1) of the Act, 21 U.S.C.section 360bbb-3(b)(1), unless the authorization is terminated  or revoked sooner.       Influenza A by PCR NEGATIVE NEGATIVE Final   Influenza B by PCR NEGATIVE NEGATIVE Final    Comment: (NOTE) The Xpert Xpress SARS-CoV-2/FLU/RSV plus assay is intended as an aid in the diagnosis of influenza from Nasopharyngeal swab specimens and should not be used as a sole basis for treatment. Nasal washings and aspirates are unacceptable for Xpert Xpress SARS-CoV-2/FLU/RSV testing.  Fact Sheet for Patients: bloggercourse.com  Fact Sheet for Healthcare Providers: seriousbroker.it  This test is not yet approved or cleared by the [place]  FDA and has been authorized for detection and/or diagnosis of SARS-CoV-2 by FDA under an Emergency Use Authorization (EUA). This EUA will remain in effect (meaning this test can be used) for the duration of the COVID-19 declaration under Section 564(b)(1) of the Act, 21 U.S.C. section 360bbb-3(b)(1), unless the authorization is terminated or revoked.     Resp Syncytial Virus by PCR NEGATIVE NEGATIVE Final    Comment: (NOTE) Fact Sheet for  Patients: bloggercourse.com  Fact Sheet for Healthcare Providers: seriousbroker.it  This test is not yet approved or cleared by the [place]  FDA and has been authorized for detection and/or diagnosis of SARS-CoV-2 by FDA under an Emergency Use Authorization (EUA). This EUA will remain in effect (meaning this test can be used) for the duration of the COVID-19 declaration under Section 564(b)(1) of the Act, 21 U.S.C. section 360bbb-3(b)(1), unless the authorization is terminated or revoked.  Performed at [facility], [address] [place], [place] [address]   Blood culture (single)     Status: None (Preliminary result)   Collection Time: 04/15/23 11:36 AM   Specimen: BLOOD  Result Value Ref Range Status   Specimen Description BLOOD BLOOD RIGHT WRIST  Final   Special Requests   Final    BOTTLES DRAWN AEROBIC AND ANAEROBIC Blood Culture results may not be optimal due to an inadequate volume of blood received in culture bottles   Culture   Final    NO GROWTH 2 DAYS Performed at [facility], [address], [place], [place] [address]    Report Status PENDING  Incomplete  Culture, blood (single)     Status: None (Preliminary result)   Collection Time: 04/15/23  1:39 PM   Specimen: BLOOD  Result Value Ref Range Status   Specimen Description BLOOD BLOOD LEFT ARM  Final   Special Requests   Final    BOTTLES DRAWN AEROBIC AND ANAEROBIC Blood Culture results may not be optimal due to an inadequate volume of blood received in culture bottles   Culture   Final    NO GROWTH 2 DAYS Performed at [facility], [address] [place], [place] [address]    Report Status PENDING  Incomplete  Gastrointestinal Panel by PCR , Stool     Status: None   Collection Time: 04/16/23 11:58 AM   Specimen: Stool  Result Value Ref Range Status   Campylobacter species NOT DETECTED NOT DETECTED Final   Plesimonas  shigelloides NOT DETECTED NOT DETECTED Final   Salmonella species NOT DETECTED NOT DETECTED Final   Yersinia enterocolitica NOT DETECTED NOT DETECTED Final   Vibrio species NOT DETECTED NOT DETECTED Final   Vibrio cholerae NOT DETECTED NOT DETECTED Final   Enteroaggregative E coli (EAEC) NOT DETECTED NOT DETECTED Final   Enteropathogenic E coli (EPEC) NOT DETECTED NOT DETECTED Final   Enterotoxigenic E coli (ETEC) NOT DETECTED NOT DETECTED Final   Shiga like toxin producing E coli (STEC) NOT DETECTED NOT DETECTED Final   Shigella/Enteroinvasive E coli (EIEC) NOT DETECTED NOT DETECTED Final   Cryptosporidium NOT DETECTED NOT DETECTED Final   Cyclospora cayetanensis NOT DETECTED NOT DETECTED Final   Entamoeba histolytica NOT DETECTED NOT DETECTED Final   Giardia lamblia NOT DETECTED NOT DETECTED Final   Adenovirus F40/41 NOT DETECTED NOT DETECTED Final   Astrovirus NOT DETECTED NOT DETECTED Final   Norovirus GI/GII NOT DETECTED NOT DETECTED Final   Rotavirus A NOT DETECTED NOT DETECTED Final   Sapovirus (I, II, IV, and V) NOT DETECTED NOT DETECTED Final    Comment: Performed at [facility], [address] [place], [place] [address]  C Difficile Quick Screen w PCR reflex     Status: None   Collection Time: 04/16/23 11:58 AM   Specimen: Stool  Result Value Ref Range Status   C Diff antigen NEGATIVE NEGATIVE Final   C Diff toxin NEGATIVE NEGATIVE Final   C Diff interpretation No C. difficile detected.  Final  Comment: Performed at [facility], [address] [place], [place] [address]   Imaging ECHOCARDIOGRAM COMPLETE Result Date: 04/17/2023    ECHOCARDIOGRAM REPORT   Patient Name:   BRINA UMEDA Date of Exam: 04/17/2023 Medical Rec #:  [id number]        Height:       62.0 in Accession #:    [id number]       Weight:       136.0 lb Date of Birth:  1946-03-01        BSA:          1.623 m Patient Age:    77 years         BP:           156/59 mmHg Patient Gender: F                 HR:           56 bpm. Exam Location:  [facility] Procedure: 2D Echo, Cardiac Doppler and Color Doppler Indications:     Chest Pain  History:         Patient has no prior history of Echocardiogram examinations.                  Angina; Risk Factors:Hypertension, Diabetes and Dyslipidemia.                  CKD.  Sonographer:     Naomie Reef Referring Phys:  [id number] DWAYNE D CALLWOOD Diagnosing Phys: Cara JONETTA Lovelace MD IMPRESSIONS  1. Left ventricular ejection fraction, by estimation, is 70 to 75%. The left ventricle has hyperdynamic function. The left ventricle has no regional wall motion abnormalities. Left ventricular diastolic parameters are consistent with Grade II diastolic dysfunction (pseudonormalization).  2. Right ventricular systolic function is normal. The right ventricular size is normal.  3. The mitral valve is normal in structure. Trivial mitral valve regurgitation.  4. The aortic valve is normal in structure. Aortic valve regurgitation is not visualized. FINDINGS  Left Ventricle: Left ventricular ejection fraction, by estimation, is 70 to 75%. The left ventricle has hyperdynamic function. The left ventricle has no regional wall motion abnormalities. The left ventricular internal cavity size was normal in size. There is borderline left ventricular hypertrophy. Left ventricular diastolic parameters are consistent with Grade II diastolic dysfunction (pseudonormalization). Right Ventricle: The right ventricular size is normal. No increase in right ventricular wall thickness. Right ventricular systolic function is normal. Left Atrium: Left atrial size was normal in size. Right Atrium: Right atrial size was normal in size. Pericardium: There is no evidence of pericardial effusion. Mitral Valve: The mitral valve is normal in structure. Trivial mitral valve regurgitation. MV peak gradient, 5.2 mmHg. The mean mitral valve gradient is 2.0 mmHg. Tricuspid Valve: The tricuspid valve is normal in  structure. Tricuspid valve regurgitation is trivial. Aortic Valve: The aortic valve is normal in structure. Aortic valve regurgitation is not visualized. Aortic valve mean gradient measures 3.0 mmHg. Aortic valve peak gradient measures 6.5 mmHg. Aortic valve area, by VTI measures 2.02 cm. Pulmonic Valve: The pulmonic valve was normal in structure. Pulmonic valve regurgitation is not visualized. Aorta: The ascending aorta was not well visualized. IAS/Shunts: No atrial level shunt detected by color flow Doppler.  LEFT VENTRICLE PLAX 2D LVIDd:         4.50 cm     Diastology LVIDs:         2.50 cm     LV e' medial:  9.14 cm/s LV PW:         1.20 cm     LV E/e' medial:  12.4 LV IVS:        1.10 cm     LV e' lateral:   4.13 cm/s LVOT diam:     1.80 cm     LV E/e' lateral: 27.4 LV SV:         64 LV SV Index:   40 LVOT Area:     2.54 cm  LV Volumes (MOD) LV vol d, MOD A2C: 77.1 ml LV vol d, MOD A4C: 59.7 ml LV vol s, MOD A2C: 27.1 ml LV vol s, MOD A4C: 22.1 ml LV SV MOD A2C:     50.0 ml LV SV MOD A4C:     59.7 ml LV SV MOD BP:      46.1 ml RIGHT VENTRICLE RV Basal diam:  3.20 cm RV Mid diam:    2.70 cm RV S prime:     11.40 cm/s TAPSE (M-mode): 2.7 cm LEFT ATRIUM             Index        RIGHT ATRIUM           Index LA diam:        3.60 cm 2.22 cm/m   RA Area:     15.00 cm LA Vol (A2C):   53.2 ml 32.79 ml/m  RA Volume:   42.50 ml  26.19 ml/m LA Vol (A4C):   32.7 ml 20.15 ml/m LA Biplane Vol: 43.4 ml 26.75 ml/m  AORTIC VALVE                    PULMONIC VALVE AV Area (Vmax):    1.92 cm     PV Vmax:       1.02 m/s AV Area (Vmean):   1.99 cm     PV Peak grad:  4.2 mmHg AV Area (VTI):     2.02 cm AV Vmax:           127.00 cm/s AV Vmean:          84.600 cm/s AV VTI:            0.318 m AV Peak Grad:      6.5 mmHg AV Mean Grad:      3.0 mmHg LVOT Vmax:         95.80 cm/s LVOT Vmean:        66.300 cm/s LVOT VTI:          0.253 m LVOT/AV VTI ratio: 0.80  AORTA Ao Root diam: 2.90 cm MITRAL VALVE MV Area (PHT): 4.63 cm      SHUNTS MV Area VTI:   1.41 cm     Systemic VTI:  0.25 m MV Peak grad:  5.2 mmHg     Systemic Diam: 1.80 cm MV Mean grad:  2.0 mmHg MV Vmax:       1.14 m/s MV Vmean:      58.3 cm/s MV Decel Time: 164 msec MV E velocity: 113.00 cm/s MV A velocity: 86.50 cm/s MV E/A ratio:  1.31 Dwayne JONETTA Lovelace MD Electronically signed by Cara JONETTA Lovelace MD Signature Date/Time: 04/17/2023/3:11:55 PM    Final       Time coordinating discharge: over 30 minutes  SIGNED:  Laramie Gelles DO [facility]

## 2023-04-17 NOTE — Progress Notes (Signed)
*  PRELIMINARY RESULTS* Echocardiogram 2D Echocardiogram has been performed.  Carolyne Fiscal 04/17/2023, 12:37 PM

## 2023-04-17 NOTE — Plan of Care (Signed)
   Problem: Education: Goal: Ability to describe self-care measures that may prevent or decrease complications (Diabetes Survival Skills Education) will improve Outcome: Progressing Goal: Individualized Educational Video(s) Outcome: Progressing

## 2023-04-20 LAB — CULTURE, BLOOD (SINGLE)
Culture: NO GROWTH
Culture: NO GROWTH

## 2023-05-03 NOTE — Progress Notes (Signed)
[facility] Card[facility]    SUBJECTIVE: Patient feels improved less shortness of breath improved diarrhea improved weakness no fever.   Vitals:   04/16/23 1626 04/16/23 2029 04/17/23 0350 04/17/23 0819  BP: (!) 138/90 (!) 151/61 (!) 142/59 (!) 156/59  Pulse: 63 60 62 (!) 55  Resp: 18 16 16 16   Temp: 98 F (36.7 C) 98.2 F (36.8 C) 98.2 F (36.8 C) 98 F (36.7 C)  TempSrc:  Oral Oral Oral  SpO2: 100% 97% 95% 96%  Weight:      Height:        No intake or output data in the 24 hours ending 05/03/23 1338    PHYSICAL EXAM  General: Well developed, well nourished, in no acute distress HEENT:  Normocephalic and atramatic Neck:  No JVD.  Lungs: Clear bilaterally to auscultation and percussion. Heart: HRRR . Normal S1 and S2 without gallops or murmurs.  Abdomen: Bowel sounds are positive, abdomen soft and non-tender  Msk:  Back normal, normal gait. Normal strength and tone for age. Extremities: No clubbing, cyanosis or edema.   Neuro: Alert and oriented X 3. Psych:  Good affect, responds appropriately   LABS: Basic Metabolic Panel: No results for input(s): "NA", "K", "CL", "CO2", "GLUCOSE", "BUN", "CREATININE", "CALCIUM", "MG", "PHOS" in the last 72 hours. Liver Function Tests: No results for input(s): "AST", "ALT", "ALKPHOS", "BILITOT", "PROT", "ALBUMIN" in the last 72 hours. No results for input(s): "LIPASE", "AMYLASE" in the last 72 hours. CBC: No results for input(s): "WBC", "NEUTROABS", "HGB", "HCT", "MCV", "PLT" in the last 72 hours. Cardiac Enzymes: No results for input(s): "CKTOTAL", "CKMB", "CKMBINDEX", "TROPONINI" in the last 72 hours. BNP: Invalid input(s): "POCBNP" D-Dimer: No results for input(s): "DDIMER" in the last 72 hours. Hemoglobin A1C: No results for input(s): "HGBA1C" in the last 72 hours. Fasting Lipid Panel: No results for input(s): "CHOL", "HDL", "LDLCALC", "TRIG", "CHOLHDL", "LDLDIRECT" in the last 72 hours. Thyroid Function Tests: No results for  input(s): "TSH", "T4TOTAL", "T3FREE", "THYROIDAB" in the last 72 hours.  Invalid input(s): "FREET3" Anemia Panel: No results for input(s): "VITAMINB12", "FOLATE", "FERRITIN", "TIBC", "IRON", "RETICCTPCT" in the last 72 hours.  No results found.   Echo of left ventricular function EF of at least 70%  TELEMETRY: Sinus tachycardia rate of around at home.  Right bundle branch block nonspecific CT changes:  ASSESSMENT AND PLAN:  Principal Problem:   Chest pain Active Problems:   Unstable angina (HCC)   Diabetes mellitus type 2, noninsulin dependent (HCC)   Essential hypertension   Hyperlipidemia   Leukocytosis   CKD stage 3a, GFR 45-59 ml/min (HCC)   Hypothyroidism   Diarrhea    Plan Chest pain possibly unstable angina negative workup recommend outpatient follow-up with cardiology SIRS fever leukocytosis elevated lactic acid patient is since responded improved with medical therapy will have the patient follow-up with pulmonary as an outpatient Chronic renal insufficiency stage IIIa stable maintain adequate hydration avoid nephrotoxic drugs Hypertension reasonably controlled not on any home meds consider adding Norvasc Diabetes type 2 uncomplicated this month diet and exercise follow-up with primary physician Agree. with discharge patient appears to have improved adequately   Alwyn Pea, MD 05/03/2023 1:38 PM

## 2023-05-03 NOTE — Progress Notes (Signed)
[facility] Card[facility]    SUBJECTIVE: Patient states to be doing reasonably well no significant chest pain diarrhea somewhat improved fever somewhat improved she still feels fatigued with bodyaches   Vitals:   04/16/23 1626 04/16/23 2029 04/17/23 0350 04/17/23 0819  BP: (!) 138/90 (!) 151/61 (!) 142/59 (!) 156/59  Pulse: 63 60 62 (!) 55  Resp: 18 16 16 16   Temp: 98 F (36.7 C) 98.2 F (36.8 C) 98.2 F (36.8 C) 98 F (36.7 C)  TempSrc:  Oral Oral Oral  SpO2: 100% 97% 95% 96%  Weight:      Height:        No intake or output data in the 24 hours ending 05/03/23 1334    PHYSICAL EXAM  General: Well developed, well nourished, in no acute distress HEENT:  Normocephalic and atramatic Neck:  No JVD.  Lungs: Clear bilaterally to auscultation and percussion. Heart: HRRR . Normal S1 and S2 without gallops or murmurs.  Abdomen: Bowel sounds are positive, abdomen soft and non-tender  Msk:  Back normal, normal gait. Normal strength and tone for age. Extremities: No clubbing, cyanosis or edema.   Neuro: Alert and oriented X 3. Psych:  Good affect, responds appropriately   LABS: Basic Metabolic Panel: No results for input(s): "NA", "K", "CL", "CO2", "GLUCOSE", "BUN", "CREATININE", "CALCIUM", "MG", "PHOS" in the last 72 hours. Liver Function Tests: No results for input(s): "AST", "ALT", "ALKPHOS", "BILITOT", "PROT", "ALBUMIN" in the last 72 hours. No results for input(s): "LIPASE", "AMYLASE" in the last 72 hours. CBC: No results for input(s): "WBC", "NEUTROABS", "HGB", "HCT", "MCV", "PLT" in the last 72 hours. Cardiac Enzymes: No results for input(s): "CKTOTAL", "CKMB", "CKMBINDEX", "TROPONINI" in the last 72 hours. BNP: Invalid input(s): "POCBNP" D-Dimer: No results for input(s): "DDIMER" in the last 72 hours. Hemoglobin A1C: No results for input(s): "HGBA1C" in the last 72 hours. Fasting Lipid Panel: No results for input(s): "CHOL", "HDL", "LDLCALC", "TRIG", "CHOLHDL", "LDLDIRECT"  in the last 72 hours. Thyroid Function Tests: No results for input(s): "TSH", "T4TOTAL", "T3FREE", "THYROIDAB" in the last 72 hours.  Invalid input(s): "FREET3" Anemia Panel: No results for input(s): "VITAMINB12", "FOLATE", "FERRITIN", "TIBC", "IRON", "RETICCTPCT" in the last 72 hours.  No results found.   Echo pending  TELEMETRY: Normal sinus rhythm rate of around the 100 right bundle branch block nonspecific ST-T wave changes:  ASSESSMENT AND PLAN:  Principal Problem:   Chest pain Active Problems:   Unstable angina (HCC)   Diabetes mellitus type 2, noninsulin dependent (HCC)   Essential hypertension   Hyperlipidemia   Leukocytosis   CKD stage 3a, GFR 45-59 ml/min (HCC)   Hypothyroidism   Diarrhea    Plan Flat troponins unlike to be unstable angina consider functional study do not recommend invasive strategy Agree with echocardiogram for assessment left ventricular function wall motion and valvular structures Leukocytosis and lactic acidosis concerning for possible source of infection Agree with broad-spectrum antibiotic therapy until further workup complete Diarrhea unclear etiology evaluating for C. Difficile Chronic renal sufficiency stage IIIa continue adequate hydration Hypertension reasonably controlled currently on hydralazine as needed IV Diabetes type 2 uncomplicated continue current management with regular insulin as needed patient was on Glucotrol metformin which should be held because of her lactic acidosis Hyperlipidemia recommend continue Lipitor therapy for lipid management Will consider functional study versus cath based on how the patient progresses Recommend evaluation for noncardiac causes as well as source of infection with lactic acidosis and elevated white count Conservative care for what appears to be  noncardiac chest pain symptoms   Alwyn Pea, MD 05/03/2023 1:34 PM

## 2023-06-26 ENCOUNTER — Ambulatory Visit: Payer: Medicare Other | Admitting: Cardiovascular Disease

## 2023-09-10 ENCOUNTER — Other Ambulatory Visit: Payer: Self-pay | Admitting: Internal Medicine

## 2023-09-10 DIAGNOSIS — Z1231 Encounter for screening mammogram for malignant neoplasm of breast: Secondary | ICD-10-CM

## 2023-10-12 ENCOUNTER — Ambulatory Visit
Admission: RE | Admit: 2023-10-12 | Discharge: 2023-10-12 | Disposition: A | Discharge status: Home / Self Care | Source: Ambulatory Visit | Attending: Internal Medicine | Admitting: Internal Medicine

## 2023-10-12 DIAGNOSIS — Z1231 Encounter for screening mammogram for malignant neoplasm of breast: Secondary | ICD-10-CM | POA: Diagnosis present

## 2024-01-04 ENCOUNTER — Other Ambulatory Visit (INDEPENDENT_AMBULATORY_CARE_PROVIDER_SITE_OTHER): Payer: Self-pay | Admitting: Nurse Practitioner

## 2024-01-04 DIAGNOSIS — I6521 Occlusion and stenosis of right carotid artery: Secondary | ICD-10-CM

## 2024-01-06 ENCOUNTER — Ambulatory Visit (INDEPENDENT_AMBULATORY_CARE_PROVIDER_SITE_OTHER): Admitting: Nurse Practitioner

## 2024-01-06 ENCOUNTER — Encounter (INDEPENDENT_AMBULATORY_CARE_PROVIDER_SITE_OTHER): Payer: Self-pay | Admitting: Nurse Practitioner

## 2024-01-06 ENCOUNTER — Ambulatory Visit (INDEPENDENT_AMBULATORY_CARE_PROVIDER_SITE_OTHER)

## 2024-01-06 VITALS — BP 129/70 | HR 58 | Wt 138.5 lb

## 2024-01-06 DIAGNOSIS — E119 Type 2 diabetes mellitus without complications: Secondary | ICD-10-CM

## 2024-01-06 DIAGNOSIS — I10 Essential (primary) hypertension: Secondary | ICD-10-CM

## 2024-01-06 DIAGNOSIS — I6521 Occlusion and stenosis of right carotid artery: Secondary | ICD-10-CM

## 2024-01-06 DIAGNOSIS — I6523 Occlusion and stenosis of bilateral carotid arteries: Secondary | ICD-10-CM

## 2024-01-12 ENCOUNTER — Encounter: Payer: Self-pay | Admitting: Surgery

## 2024-01-13 ENCOUNTER — Encounter: Payer: Self-pay | Admitting: Surgery

## 2024-01-13 ENCOUNTER — Ambulatory Visit: Admitting: Certified Registered"

## 2024-01-13 ENCOUNTER — Other Ambulatory Visit: Payer: Self-pay | Admitting: Family

## 2024-01-13 ENCOUNTER — Encounter
Admission: RE | Disposition: A | Discharge status: Home / Self Care | Payer: Self-pay | Source: Home / Self Care | Attending: Surgery

## 2024-01-13 ENCOUNTER — Ambulatory Visit
Admission: RE | Admit: 2024-01-13 | Discharge: 2024-01-13 | Disposition: A | Discharge status: Home / Self Care | Attending: Surgery | Admitting: Surgery

## 2024-01-13 DIAGNOSIS — I10 Essential (primary) hypertension: Secondary | ICD-10-CM | POA: Diagnosis not present

## 2024-01-13 DIAGNOSIS — E039 Hypothyroidism, unspecified: Secondary | ICD-10-CM | POA: Diagnosis not present

## 2024-01-13 DIAGNOSIS — E119 Type 2 diabetes mellitus without complications: Secondary | ICD-10-CM | POA: Diagnosis not present

## 2024-01-13 DIAGNOSIS — Z87891 Personal history of nicotine dependence: Secondary | ICD-10-CM | POA: Insufficient documentation

## 2024-01-13 DIAGNOSIS — K640 First degree hemorrhoids: Secondary | ICD-10-CM | POA: Insufficient documentation

## 2024-01-13 DIAGNOSIS — Z7984 Long term (current) use of oral hypoglycemic drugs: Secondary | ICD-10-CM | POA: Insufficient documentation

## 2024-01-13 DIAGNOSIS — I2510 Atherosclerotic heart disease of native coronary artery without angina pectoris: Secondary | ICD-10-CM | POA: Diagnosis not present

## 2024-01-13 DIAGNOSIS — Z1211 Encounter for screening for malignant neoplasm of colon: Secondary | ICD-10-CM | POA: Diagnosis present

## 2024-01-13 DIAGNOSIS — Z955 Presence of coronary angioplasty implant and graft: Secondary | ICD-10-CM | POA: Insufficient documentation

## 2024-01-13 DIAGNOSIS — G4730 Sleep apnea, unspecified: Secondary | ICD-10-CM | POA: Insufficient documentation

## 2024-01-13 HISTORY — PX: COLONOSCOPY: SHX5424

## 2024-01-13 HISTORY — PX: POLYPECTOMY: SHX149

## 2024-01-13 HISTORY — DX: Sleep apnea, unspecified: G47.30

## 2024-01-13 HISTORY — DX: Atherosclerotic heart disease of native coronary artery without angina pectoris: I25.10

## 2024-01-13 LAB — GLUCOSE, CAPILLARY: Glucose-Capillary: 148 mg/dL — ABNORMAL HIGH (ref 70–99)

## 2024-01-13 SURGERY — COLONOSCOPY
Anesthesia: General | Site: Rectum

## 2024-01-13 MED ORDER — SODIUM CHLORIDE 0.9 % IV SOLN
INTRAVENOUS | Status: DC
  Administered 2024-01-13: 20 mL/h via INTRAVENOUS

## 2024-01-13 MED ORDER — LIDOCAINE HCL (CARDIAC) PF 100 MG/5ML IV SOSY
PREFILLED_SYRINGE | INTRAVENOUS | Status: DC | PRN
  Administered 2024-01-13: 100 mg via INTRAVENOUS

## 2024-01-13 MED ORDER — PROPOFOL 500 MG/50ML IV EMUL
INTRAVENOUS | Status: DC | PRN
  Administered 2024-01-13: 145 ug/kg/min via INTRAVENOUS

## 2024-01-13 MED ORDER — GLYCOPYRROLATE 0.2 MG/ML IJ SOLN
INTRAMUSCULAR | Status: DC | PRN
  Administered 2024-01-13: .2 mg via INTRAVENOUS

## 2024-01-13 MED ORDER — EPHEDRINE SULFATE-NACL 50-0.9 MG/10ML-% IV SOSY
PREFILLED_SYRINGE | INTRAVENOUS | Status: DC | PRN
  Administered 2024-01-13: 5 mg via INTRAVENOUS

## 2024-01-13 MED ORDER — PROPOFOL 10 MG/ML IV BOLUS
INTRAVENOUS | Status: DC | PRN
Start: 2024-01-13 — End: 2024-01-13
  Administered 2024-01-13: 50 mg via INTRAVENOUS

## 2024-01-13 NOTE — Anesthesia Postprocedure Evaluation (Signed)
 Anesthesia Post Note  Patient: [name]  Procedure(s) Performed: COLONOSCOPY (Rectum) POLYPECTOMY, INTESTINE  Patient location during evaluation: PACU Anesthesia Type: General Level of consciousness: awake Pain management: pain level controlled Vital Signs Assessment: post-procedure vital signs reviewed and stable Respiratory status: spontaneous breathing Cardiovascular status: stable Anesthetic complications: no   No notable events documented.   Last Vitals:  Vitals:   01/13/24 0919 01/13/24 0929  BP: 126/60 96/85  Pulse: 89 83  Resp: 16 17  Temp:    SpO2: 100% 100%    Last Pain:  Vitals:   01/13/24 0929  TempSrc:   PainSc: 0-No pain                 VAN STAVEREN,[name]

## 2024-01-13 NOTE — H&P (Signed)
 CC: Encounter for screening colonoscopy [Z12.11]   HPI:  presents for evaluation of above.    History of Present Illness   Last one 10 yrs ago, screening. No polyps.  Due for one now.  No complaints otherwise   Past Medical History:  has a past medical history of Allergy, Anemia, Chicken pox, COVID-19 (11/13/2020), DM2 (diabetes mellitus, type 2) (CMS/HHS-HCC), HLD (hyperlipidemia), Hypothyroidism, Osteoporosis, Skin cancer, and Sleep apnea.   Past Surgical History:  has a past surgical history that includes Hysterectomy; Appendectomy; Endoscopic Carpal Tunnel Release; invasive melanoma removal (2017); and Cataract extraction.   Family History: family history includes Diabetes type II in her father, maternal uncle, and mother; Myocardial Infarction (Heart attack) in her mother; Ovarian cancer in her mother.   Social History:  reports that she quit smoking about 25 years ago. Her smoking use included cigarettes. She has never used smokeless tobacco. She reports that she does not drink alcohol and does not use drugs.   Current Medications: has a current medication list which includes the following prescription(s): acetaminophen , ascorbic acid  (vitamin c ), aspirin , atorvastatin , biotin, blood glucose diagnostic, blood-glucose meter, calcium  carbonate-vitamin d3, cholecalciferol , clotrimazole-betamethasone, cyanocobalamin , fenofibrate nanocrystallized, ferrous sulfate , fluticasone  propionate, glipizide, hydrochlorothiazide, lancets, levothyroxine , metformin, multivitamin-lutein, omega-3 fatty acids/fish oil, and beta-carotene(a)-vits c,e/mins.   Allergies:       Allergies  Allergen Reactions   Darvocet A500 [Propoxyphene N-Acetaminophen ] Nausea   Percocet [Oxycodone -Acetaminophen ] Nausea   Propoxyphene Nausea And Vomiting and Nausea   Tramadol Nausea   Ciprofloxacin Itching   Ibuprofen Other (See Comments)      feels like an elephant sitting on chest   Pseudoephedrine Other (See Comments)       Insomnia      ROS:  A 15 point review of systems was performed and pertinent positives and negatives noted in HPI   Objective:      BP (!) 152/68   Pulse 71   Ht 158.8 cm (5' 2.5)   Wt 63.5 kg (140 lb)   BMI 25.20 kg/m    Constitutional :  No distress, cooperative, alert  Lymphatics/Throat:  Supple with no lymphadenopathy  Respiratory:  Clear to auscultation bilaterally  Cardiovascular:  Regular rate and rhythm  Gastrointestinal: Soft, non-tender, non-distended, no organomegaly.  Musculoskeletal: Steady gait and movement  Skin: Cool and moist  Psychiatric: Normal affect, non-agitated, not confused           LABS:  N/a    RADS: N/a   Assessment:       Encounter for screening colonoscopy [Z12.11]   Plan:      1. Encounter for screening colonoscopy [Z12.11] R/b/a discussed.  Risks include bleeding, perforation.  Benefits include diagnostic, curative procedure if needed.  Alternatives include continued observation.  Pt verbalized understanding.   Will schedule   labs/images/medications/previous chart entries reviewed personally and relevant changes/updates noted above.

## 2024-01-13 NOTE — Interval H&P Note (Signed)
 History and Physical Interval Note:  01/13/2024 10:11 AM  [name]  has presented today for surgery, with the diagnosis of Z12.11  Screening colonoscopy.  The various methods of treatment have been discussed with the patient and family. After consideration of risks, benefits and other options for treatment, the patient has consented to  Procedure(s): COLONOSCOPY (N/A) POLYPECTOMY, INTESTINE as a surgical intervention.  The patient's history has been reviewed, patient examined, no change in status, stable for surgery.  I have reviewed the patient's chart and labs.  Questions were answered to the patient's satisfaction.     Jan Walters Tye

## 2024-01-13 NOTE — Anesthesia Preprocedure Evaluation (Signed)
 Anesthesia Evaluation  Patient identified by MRN, date of birth, ID band Patient awake    Reviewed: Allergy & Precautions, NPO status , Patient's Chart, lab work & pertinent test results  Airway Mallampati: III  TM Distance: >3 FB Neck ROM: full    Dental  (+) Chipped   Pulmonary neg pulmonary ROS, sleep apnea , Patient abstained from smoking., former smoker   Pulmonary exam normal        Cardiovascular Exercise Tolerance: Good hypertension, + CAD and + Cardiac Stents  negative cardio ROS Normal cardiovascular exam     Neuro/Psych negative neurological ROS  negative psych ROS   GI/Hepatic negative GI ROS, Neg liver ROS,,,  Endo/Other  negative endocrine ROSdiabetes, Type 2, Oral Hypoglycemic AgentsHypothyroidism    Renal/GU negative Renal ROS  negative genitourinary   Musculoskeletal   Abdominal   Peds negative pediatric ROS (+)  Hematology negative hematology ROS (+)   Anesthesia Other Findings Past Medical History: 09/2013: Cancer [facility])     Comment:  skin, right elbow 03/2014: Cancer (HCC)     Comment:  skin, forehead 04/2014: Cancer (HCC)     Comment:  skin, right eyebrow No date: Coronary artery disease No date: Diabetes mellitus without complication (HCC) No date: Family history of adverse reaction to anesthesia     Comment:  Son - slow to wake No date: Hypercholesteremia No date: Hypothyroidism No date: Motion sickness     Comment:  [place] No date: Sleep apnea  Past Surgical History: No date: ABDOMINAL HYSTERECTOMY No date: APPENDECTOMY 11/04/2016: CATARACT EXTRACTION W/PHACO; Left     Comment:  Procedure: CATARACT EXTRACTION PHACO AND INTRAOCULAR               LENS PLACEMENT (IOC);  Surgeon: Jaye Fallow, MD;                Location: [facility] ORS;  Service: Ophthalmology;  Laterality:              Left;  US  00:24.5 AP% 13.5 CDE 3.30 Fluid Pack lot #                [id number] 11/25/2016: CATARACT EXTRACTION W/PHACO; Right     Comment:  Procedure: CATARACT EXTRACTION PHACO AND INTRAOCULAR               LENS PLACEMENT (IOC);  Surgeon: Jaye Fallow, MD;                Location: [facility] ORS;  Service: Ophthalmology;  Laterality:              Right;  US  00:32 AP% 15.7 CDE 5.00 Fluid pack lot #               [id number] No date: CORONARY ANGIOPLASTY 01/30/2021: CORONARY STENT INTERVENTION; N/A     Comment:  Procedure: CORONARY STENT INTERVENTION;  Surgeon: Lawyer Bernardino Cough, MD;  Location: [facility] INVASIVE CV LAB;                Service: Cardiovascular;  Laterality: N/A; No date: CTR     Comment:  release of tendon only No date: EYE SURGERY 06/17/2022: IMAGE GUIDED SINUS SURGERY; Bilateral     Comment:  Procedure: IMAGE GUIDED SINUS SURGERY;  Surgeon:               Blair Mt, MD;  Location: [facility];  Service: ENT;  Laterality: Bilateral; No date: invasive melanoma removal 01/30/2021: LEFT HEART CATH AND CORONARY ANGIOGRAPHY; N/A     Comment:  Procedure: LEFT HEART CATH AND CORONARY ANGIOGRAPHY;                Surgeon: Lawyer Bernardino Cough, MD;  Location: [facility]               INVASIVE CV LAB;  Service: Cardiovascular;  Laterality:               N/A; 06/17/2022: MAXILLARY ANTROSTOMY; Bilateral     Comment:  Procedure: MAXILLARY ANTROSTOMY WITH TISSUE REMOVAL;                Surgeon: Blair Mt, MD;  Location: [facility]               [facility];  Service: ENT;  Laterality: Bilateral; 06/17/2022: POLYPECTOMY; Bilateral     Comment:  Procedure: POLYPECTOMY NASAL;  Surgeon: Blair Mt,               MD;  Location: [facility];  Service: ENT;                Laterality: Bilateral;  Diabetic     Reproductive/Obstetrics negative OB ROS                              Anesthesia Physical Anesthesia Plan  ASA: 2  Anesthesia Plan: General   Post-op Pain Management:     Induction: Intravenous  PONV Risk Score and Plan: Propofol  infusion and TIVA  Airway Management Planned: Natural Airway and Nasal Cannula  Additional Equipment:   Intra-op Plan:   Post-operative Plan:   Informed Consent: I have reviewed the patients History and Physical, chart, labs and discussed the procedure including the risks, benefits and alternatives for the proposed anesthesia with the patient or authorized representative who has indicated his/her understanding and acceptance.     Dental Advisory Given  Plan Discussed with: CRNA  Anesthesia Plan Comments:          Anesthesia Quick Evaluation

## 2024-01-13 NOTE — Transfer of Care (Signed)
 Immediate Anesthesia Transfer of Care Note  Patient: [name]  Procedure(s) Performed: COLONOSCOPY (Rectum) POLYPECTOMY, INTESTINE  Patient Location: Endoscopy Unit  Anesthesia Type:General  Level of Consciousness: drowsy and patient cooperative  Airway & Oxygen Therapy: Patient Spontanous Breathing and Patient connected to face mask oxygen  Post-op Assessment: Report given to RN and Post -op Vital signs reviewed and stable  Post vital signs: Reviewed and stable  Last Vitals:  Vitals Value Taken Time  BP 109/47 01/13/24 09:10  Temp 35.7 C 01/13/24 09:10  Pulse 102 01/13/24 09:11  Resp 20 01/13/24 09:11  SpO2 100 % 01/13/24 09:11  Vitals shown include unfiled device data.  Last Pain:  Vitals:   01/13/24 0910  TempSrc: Temporal  PainSc: Asleep         Complications: No notable events documented.

## 2024-01-13 NOTE — Anesthesia Procedure Notes (Signed)
 Procedure Name: General with mask airway Date/Time: 01/13/2024 8:29 AM  Performed by: Ledora Duncan, CRNAPre-anesthesia Checklist: Patient identified, Emergency Drugs available, Suction available and Patient being monitored Patient Re-evaluated:Patient Re-evaluated prior to induction Oxygen Delivery Method: Simple face mask Induction Type: IV induction Placement Confirmation: positive ETCO2 and breath sounds checked- equal and bilateral Dental Injury: Teeth and Oropharynx as per pre-operative assessment

## 2024-01-13 NOTE — Op Note (Signed)
 [facility] Gastroenterology Patient Name: Margan Elias Procedure Date: 01/13/2024 8:09 AM MRN: [id number] Account #: [id number] Date of Birth: June 21, 1945 Admit Type: Outpatient Age: 78 Room: [facility] ENDO ROOM 1 Gender: Female Note Status: Finalized Instrument Name: Colon Scope [id number] Procedure:             Colonoscopy Indications:           Screening for colorectal malignant neoplasm Providers:             Henriette Pierre MD, MD Medicines:             Propofol  per Anesthesia Complications:         No immediate complications. Procedure:             Pre-Anesthesia Assessment:                        - After reviewing the risks and benefits, the patient                         was deemed in satisfactory condition to undergo the                         procedure in an ambulatory setting.                        After obtaining informed consent, the colonoscope was                         passed under direct vision. Throughout the procedure,                         the patient's blood pressure, pulse, and oxygen                         saturations were monitored continuously. The was                         introduced through the anus and advanced to the the                         cecum, identified by the ileocecal valve. The                         colonoscopy was performed with moderate difficulty due                         to poor bowel prep with stool present. Successful                         completion of the procedure was aided by lavage. The                         patient tolerated the procedure well. Findings:      The perianal and digital rectal examinations were normal.      A 4 mm polyp was found in the distal ascending colon. The polyp was       semi-sessile. The polyp was removed with a hot snare. Resection and       retrieval were complete. Estimated blood loss was  minimal.      Non-bleeding internal hemorrhoids were found during retroflexion. The        hemorrhoids were Grade I (internal hemorrhoids that do not prolapse). Impression:            - One 4 mm polyp in the distal ascending colon,                         removed with a hot snare. Resected and retrieved.                        - Non-bleeding internal hemorrhoids. Recommendation:        - Await pathology results.                        - Written discharge instructions were provided to the                         patient.                        - Discharge patient to home.                        - Resume previous diet. Procedure Code(s):     --- Professional ---                        [phone], Colonoscopy, flexible; with removal of                         tumor(s), polyp(s), or other lesion(s) by snare                         technique Diagnosis Code(s):     --- Professional ---                        Z12.11, Encounter for screening for malignant neoplasm                         of colon                        D12.2, Benign neoplasm of ascending colon                        K64.0, First degree hemorrhoids CPT copyright 2022 [employer]. All rights reserved. The codes documented in this report are preliminary and upon coder review may  be revised to meet current compliance requirements. Dr. Henriette Sevin, MD Henriette Pierre MD, MD 01/13/2024 9:09:20 AM This report has been signed electronically. Number of Addenda: 0 Note Initiated On: 01/13/2024 8:09 AM Scope Withdrawal Time: 0 hours 17 minutes 55 seconds  Total Procedure Duration: 0 hours 33 minutes 15 seconds  Estimated Blood Loss:  Estimated blood loss: none. Estimated blood loss was                         minimal.      [facility]

## 2024-01-14 LAB — SURGICAL PATHOLOGY

## 2024-01-26 NOTE — Progress Notes (Signed)
 Subjective:    Patient ID: [name], female    DOB: 11-Mar-1946, 78 y.o.   MRN: [id number] Chief Complaint  Patient presents with   Establish Care    The patient presents today for evaluation of carotid artery stenosis.  This was initially detected with a right carotid bruit and subsequent studies done at an outside facility which suggest 50 to 69% stenosis in the right ICA.  She denies any amaurosis fugax.  There is no TIA or CVA-like symptoms.  Noninvasive studies show 40 to 59% right ICA stenosis.  Velocities are somewhat similar to the outside studies.  1 to 39% stenosis of the left ICA.  There is antegrade flow of the bilateral vertebral arteries with normal flow hemodynamics and in the bilateral subclavian arteries    Review of Systems  All other systems reviewed and are negative.      Objective:   Physical Exam Vitals reviewed.  HENT:     Head: Normocephalic.  Neck:     Vascular: Carotid bruit present.  Cardiovascular:     Rate and Rhythm: Normal rate.     Pulses:          Radial pulses are 2+ on the right side and 2+ on the left side.  Pulmonary:     Effort: Pulmonary effort is normal.  Skin:    General: Skin is warm and dry.  Neurological:     Mental Status: She is alert and oriented to person, place, and time.  Psychiatric:        Mood and Affect: Mood normal.        Behavior: Behavior normal.        Thought Content: Thought content normal.        Judgment: Judgment normal.     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, right eyebrow   Coronary artery disease    Diabetes mellitus without complication (HCC)    Family history of adverse reaction to anesthesia    Son - slow to wake   Hypercholesteremia    Hypothyroidism    Motion sickness    car on [place]    Sleep apnea     Social History   Socioeconomic History   Marital status: Married    Spouse name: Not on file   Number of children: Not on file   Years of education: Not on file   Highest education level: Not on file  Occupational History   Not on file  Tobacco Use   Smoking status: Former    Current packs/day: 0.00    Average packs/day: 0.5 packs/day for 10.0 years (5.0 ttl pk-yrs)    Types: Cigarettes    Start date: 04/14/1988    Quit date: 04/14/1998    Years since quitting: 25.8   Smokeless tobacco: Never  Vaping Use   Vaping status: Never Used  Substance and Sexual Activity   Alcohol use: No   Drug use: Not Currently   Sexual activity: Never  Other Topics Concern   Not on file  Social History Narrative   Not on file   Social Drivers of Health   Financial Resource Strain: Low Risk  (12/23/2023)   Received from [facility]   Overall Financial Resource Strain (CARDIA)    Difficulty of Paying Living Expenses: Not hard at all  Food Insecurity: No Food Insecurity (12/23/2023)   Received from [facility]   Hunger Vital Sign    Within the past 12 months, you worried that your food would run out before you got the money to buy more.: Never true    Within the past 12 months, the food you bought just didn't last and you didn't have money to get more.: Never true  Transportation Needs: No Transportation Needs (12/23/2023)   Received from [facility] - Transportation    In the past 12 months, has lack of transportation kept you from medical appointments or from getting medications?: No    Lack of Transportation (Non-Medical): No  Physical Activity: Not on file  Stress: Not on file  Social Connections: Socially Integrated (04/16/2023)   Social Connection and Isolation Panel    Frequency of Communication with Friends and Family: Twice a week    Frequency of Social Gatherings with Friends and Family: Twice a week     Attends Religious Services: More than 4 times per year    Active Member of [employer] or Organizations: Yes    Attends Engineer, structural: More than 4 times per year    Marital Status: Married  Catering manager Violence: Not At Risk (04/16/2023)   Humiliation, Afraid, Rape, and Kick questionnaire    Fear of Current or Ex-Partner: No    Emotionally Abused: No    Physically Abused: No    Sexually Abused: No    Past Surgical History:  Procedure Laterality Date   ABDOMINAL HYSTERECTOMY     APPENDECTOMY     CATARACT EXTRACTION W/PHACO Left 11/04/2016   Procedure: CATARACT EXTRACTION PHACO AND INTRAOCULAR LENS PLACEMENT (IOC);  Surgeon: Jaye Fallow, MD;  Location: [facility] ORS;  Service: Ophthalmology;  Laterality: Left;  US  00:24.5 AP% 13.5 CDE 3.30 Fluid Pack lot # [id number]   CATARACT EXTRACTION W/PHACO Right 11/25/2016   Procedure: CATARACT EXTRACTION PHACO AND INTRAOCULAR LENS PLACEMENT (IOC);  Surgeon: Jaye Fallow, MD;  Location: [facility];  Service: Ophthalmology;  Laterality: Right;  US  00:32 AP% 15.7 CDE 5.00 Fluid pack lot # [id number]   COLONOSCOPY N/A 01/13/2024   Procedure: COLONOSCOPY;  Surgeon: Tye Millet, DO;  Location: [facility];  Service: General;  Laterality: N/A;   CORONARY ANGIOPLASTY     CORONARY STENT INTERVENTION N/A 01/30/2021   Procedure: CORONARY STENT INTERVENTION;  Surgeon: Lawyer Bernardino Cough, MD;  Location: [facility] INVASIVE CV LAB;  Service: Cardiovascular;  Laterality: N/A;   CTR     release of tendon only   EYE SURGERY     IMAGE GUIDED SINUS SURGERY Bilateral 06/17/2022   Procedure: IMAGE GUIDED SINUS SURGERY;  Surgeon: Blair Mt, MD;  Location: [facility];  Service: ENT;  Laterality: Bilateral;   invasive melanoma removal     LEFT HEART CATH AND CORONARY ANGIOGRAPHY N/A 01/30/2021   Procedure: LEFT HEART CATH AND CORONARY ANGIOGRAPHY;  Surgeon: Lawyer Bernardino Cough, MD;  Location: [facility] INVASIVE CV LAB;  Service: Cardiovascular;   Laterality: N/A;   MAXILLARY ANTROSTOMY Bilateral 06/17/2022   Procedure: MAXILLARY ANTROSTOMY WITH TISSUE REMOVAL;  Surgeon: Blair Mt, MD;  Location: [facility];  Service: ENT;  Laterality: Bilateral;   POLYPECTOMY Bilateral 06/17/2022   Procedure: POLYPECTOMY NASAL;  Surgeon: Blair Mt, MD;  Location: [facility];  Service: ENT;  Laterality:  Bilateral;  Diabetic   POLYPECTOMY  01/13/2024   Procedure: POLYPECTOMY, INTESTINE;  Surgeon: Tye Millet, DO;  Location: [facility] ENDOSCOPY;  Service: General;;    Family History  Problem Relation Age of Onset   Cancer Mother 45       cervical   Breast cancer Neg Hx     Allergies  Allergen Reactions   Ibuprofen Shortness Of Breath    Other reaction(s): Other (See Comments) feels like an elephant sitting on chest   Propoxyphene Nausea And Vomiting   Tramadol Nausea And Vomiting   Ciprofloxacin Hives and Itching   Hydrocodone  Nausea And Vomiting   Oxycodone  Nausea And Vomiting   Pseudoephedrine Other (See Comments)    Insomnia       Latest Ref Rng & Units 04/16/2023    2:28 AM 04/15/2023   10:35 AM 01/31/2021    5:34 AM  CBC  WBC 4.0 - 10.5 K/uL 14.1  14.1  8.1   Hemoglobin 12.0 - 15.0 g/dL 9.1  88.9  88.1   Hematocrit 36.0 - 46.0 % 27.6  33.5  36.3   Platelets 150 - 400 K/uL 248  324  343       CMP     Component Value Date/Time   NA 139 04/16/2023 0228   NA 140 08/22/2013 1517   K 3.4 (L) 04/16/2023 0228   K 4.4 08/22/2013 1517   CL 107 04/16/2023 0228   CL 105 08/22/2013 1517   CO2 22 04/16/2023 0228   CO2 30 08/22/2013 1517   GLUCOSE 107 (H) 04/16/2023 0228   GLUCOSE 144 (H) 08/22/2013 1517   BUN 13 04/16/2023 0228   BUN 14 08/22/2013 1517   CREATININE 0.95 04/16/2023 0228   CREATININE 0.86 08/22/2013 1517   CALCIUM  8.2 (L) 04/16/2023 0228   CALCIUM  9.5 08/22/2013 1517   PROT 6.4 (L) 04/15/2023 1035   ALBUMIN 4.0 04/15/2023 1035   AST 45 (H) 04/15/2023 1035   ALT 32 04/15/2023 1035    ALKPHOS 42 04/15/2023 1035   BILITOT 0.8 04/15/2023 1035   GFRNONAA >60 04/16/2023 0228   GFRNONAA >60 08/22/2013 1517     No results found.     Assessment & Plan:   1. Bilateral carotid artery stenosis (Primary) Patient's outside studies suggest a 50 to 69% stenosis of the right ICA however based upon their velocities she would be more so in the 40 to 59% stenosis range.  Velocities are fairly similar.  Based on this we will have the patient return in 6 months to reevaluate studies to determine if there has been any significant changes.  Not we will likely move forward with following annually.  2. Essential hypertension Continue antihypertensive medications as already ordered, these medications have been reviewed and there are no changes at this time.  3. Diabetes mellitus type 2, noninsulin dependent (HCC) Continue hypoglycemic medications as already ordered, these medications have been reviewed and there are no changes at this time.  Hgb A1C to be monitored as already arranged by primary service   Current Outpatient Medications on File Prior to Visit  Medication Sig Dispense Refill   acetaminophen  (TYLENOL ) 500 MG tablet Take 500 mg by mouth 2 (two) times daily as needed for mild pain.     aspirin  EC 81 MG tablet Take 81 mg by mouth daily. Swallow whole.     atorvastatin  (LIPITOR ) 80 MG tablet Take 1 tablet (80 mg total) by mouth daily. 30 tablet 11   Calcium  Carbonate-Vitamin D (CALCIUM   600+D PO) Take 1 tablet by mouth 2 (two) times daily with a meal.     fenofibrate (TRICOR) 48 MG tablet Take 48 mg by mouth daily with lunch.     ferrous sulfate  325 (65 FE) MG tablet Take 325 mg by mouth daily with supper.     fluticasone  (FLONASE ) 50 MCG/ACT nasal spray Place 2 sprays into both nostrils daily.     glipiZIDE (GLUCOTROL) 10 MG tablet Take 10 mg by mouth 2 (two) times daily before a meal.     hydrochlorothiazide (HYDRODIURIL) 12.5 MG tablet Take 12.5 mg by mouth every other day.      levothyroxine  (SYNTHROID , LEVOTHROID) 125 MCG tablet Take 125 mcg by mouth daily before breakfast. Monday through Saturday     metFORMIN (GLUCOPHAGE) 1000 MG tablet Take 1,000 mg by mouth 2 (two) times daily.     Multiple Vitamin (MULTIVITAMIN WITH MINERALS) TABS tablet Take 1 tablet by mouth daily with supper.     Multiple Vitamins-Minerals (ICAPS PO) Take 1 tablet by mouth daily with breakfast.     Omega-3 Fatty Acids (FISH OIL) 1000 MG CAPS Take 1,000 mg by mouth daily with breakfast.     vitamin B-12 (CYANOCOBALAMIN ) 500 MCG tablet Take 500 mcg by mouth daily with breakfast.     vitamin C  (ASCORBIC ACID ) 500 MG tablet Take 500 mg by mouth daily with lunch.     No current facility-administered medications on file prior to visit.    There are no Patient Instructions on file for this visit. No follow-ups on file.   Kay Ricciuti E Lou Loewe, NP

## 2024-07-05 ENCOUNTER — Encounter (INDEPENDENT_AMBULATORY_CARE_PROVIDER_SITE_OTHER)

## 2024-07-05 ENCOUNTER — Ambulatory Visit (INDEPENDENT_AMBULATORY_CARE_PROVIDER_SITE_OTHER): Admitting: Nurse Practitioner
